# Patient Record
Sex: Male | Born: 1935 | Race: White | Hispanic: No | Marital: Married | Smoking: Never smoker
Health system: Southern US, Community
[De-identification: ages and names within clinical notes are randomized; demographics above are authoritative.]

## PROBLEM LIST (undated history)

## (undated) DIAGNOSIS — M1990 Unspecified osteoarthritis, unspecified site: Secondary | ICD-10-CM

## (undated) DIAGNOSIS — T7840XA Allergy, unspecified, initial encounter: Secondary | ICD-10-CM

## (undated) DIAGNOSIS — C61 Malignant neoplasm of prostate: Secondary | ICD-10-CM

## (undated) DIAGNOSIS — N400 Enlarged prostate without lower urinary tract symptoms: Secondary | ICD-10-CM

## (undated) DIAGNOSIS — E785 Hyperlipidemia, unspecified: Secondary | ICD-10-CM

## (undated) DIAGNOSIS — K219 Gastro-esophageal reflux disease without esophagitis: Secondary | ICD-10-CM

## (undated) DIAGNOSIS — B351 Tinea unguium: Secondary | ICD-10-CM

## (undated) DIAGNOSIS — T8859XA Other complications of anesthesia, initial encounter: Secondary | ICD-10-CM

## (undated) DIAGNOSIS — J300 Vasomotor rhinitis: Secondary | ICD-10-CM

## (undated) DIAGNOSIS — T4145XA Adverse effect of unspecified anesthetic, initial encounter: Secondary | ICD-10-CM

## (undated) DIAGNOSIS — E119 Type 2 diabetes mellitus without complications: Secondary | ICD-10-CM

## (undated) DIAGNOSIS — D126 Benign neoplasm of colon, unspecified: Secondary | ICD-10-CM

## (undated) DIAGNOSIS — H269 Unspecified cataract: Secondary | ICD-10-CM

## (undated) DIAGNOSIS — Z87442 Personal history of urinary calculi: Secondary | ICD-10-CM

## (undated) DIAGNOSIS — M25552 Pain in left hip: Secondary | ICD-10-CM

## (undated) DIAGNOSIS — Z0000 Encounter for general adult medical examination without abnormal findings: Secondary | ICD-10-CM

## (undated) HISTORY — DX: Hyperlipidemia, unspecified: E78.5

## (undated) HISTORY — DX: Unspecified osteoarthritis, unspecified site: M19.90

## (undated) HISTORY — PX: COLONOSCOPY: SHX174

## (undated) HISTORY — DX: Benign prostatic hyperplasia without lower urinary tract symptoms: N40.0

## (undated) HISTORY — DX: Encounter for general adult medical examination without abnormal findings: Z00.00

## (undated) HISTORY — PX: EYE SURGERY: SHX253

## (undated) HISTORY — PX: CATARACT EXTRACTION, BILATERAL: SHX1313

## (undated) HISTORY — DX: Unspecified cataract: H26.9

## (undated) HISTORY — DX: Tinea unguium: B35.1

## (undated) HISTORY — DX: Type 2 diabetes mellitus without complications: E11.9

## (undated) HISTORY — DX: Gastro-esophageal reflux disease without esophagitis: K21.9

## (undated) HISTORY — DX: Allergy, unspecified, initial encounter: T78.40XA

## (undated) HISTORY — DX: Vasomotor rhinitis: J30.0

## (undated) HISTORY — PX: POLYPECTOMY: SHX149

## (undated) HISTORY — DX: Benign neoplasm of colon, unspecified: D12.6

## (undated) HISTORY — PX: CYSTOSCOPY WITH INSERTION OF UROLIFT: SHX6678

---
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
## 2001-07-07 DIAGNOSIS — D126 Benign neoplasm of colon, unspecified: Secondary | ICD-10-CM

## 2001-07-07 HISTORY — DX: Benign neoplasm of colon, unspecified: D12.6

## 2004-07-29 ENCOUNTER — Ambulatory Visit: Payer: Self-pay | Admitting: Gastroenterology

## 2004-08-12 ENCOUNTER — Ambulatory Visit: Payer: Self-pay | Admitting: Gastroenterology

## 2005-07-21 ENCOUNTER — Encounter: Admission: RE | Admit: 2005-07-21 | Discharge: 2005-07-21 | Payer: Self-pay | Admitting: Family Medicine

## 2005-07-25 ENCOUNTER — Encounter: Payer: Self-pay | Admitting: Internal Medicine

## 2006-09-03 ENCOUNTER — Encounter: Payer: Self-pay | Admitting: Internal Medicine

## 2007-09-05 ENCOUNTER — Encounter: Payer: Self-pay | Admitting: Internal Medicine

## 2007-09-05 LAB — CONVERTED CEMR LAB: PSA: 2 ng/mL

## 2008-09-29 ENCOUNTER — Encounter: Payer: Self-pay | Admitting: Internal Medicine

## 2009-06-19 ENCOUNTER — Ambulatory Visit: Payer: Self-pay | Admitting: Internal Medicine

## 2009-06-19 DIAGNOSIS — E119 Type 2 diabetes mellitus without complications: Secondary | ICD-10-CM | POA: Insufficient documentation

## 2009-06-19 DIAGNOSIS — K219 Gastro-esophageal reflux disease without esophagitis: Secondary | ICD-10-CM | POA: Insufficient documentation

## 2009-06-19 DIAGNOSIS — E785 Hyperlipidemia, unspecified: Secondary | ICD-10-CM | POA: Insufficient documentation

## 2009-06-19 DIAGNOSIS — H409 Unspecified glaucoma: Secondary | ICD-10-CM

## 2009-06-19 HISTORY — DX: Type 2 diabetes mellitus without complications: E11.9

## 2009-07-18 ENCOUNTER — Encounter (INDEPENDENT_AMBULATORY_CARE_PROVIDER_SITE_OTHER): Payer: Self-pay | Admitting: *Deleted

## 2009-07-30 ENCOUNTER — Encounter (INDEPENDENT_AMBULATORY_CARE_PROVIDER_SITE_OTHER): Payer: Self-pay | Admitting: *Deleted

## 2009-08-27 ENCOUNTER — Encounter (INDEPENDENT_AMBULATORY_CARE_PROVIDER_SITE_OTHER): Payer: Self-pay

## 2009-08-28 ENCOUNTER — Ambulatory Visit: Payer: Self-pay | Admitting: Gastroenterology

## 2009-09-11 ENCOUNTER — Ambulatory Visit: Payer: Self-pay | Admitting: Gastroenterology

## 2009-09-11 LAB — HM COLONOSCOPY

## 2009-09-13 ENCOUNTER — Encounter: Payer: Self-pay | Admitting: Gastroenterology

## 2009-09-25 ENCOUNTER — Ambulatory Visit: Payer: Self-pay | Admitting: Internal Medicine

## 2009-09-25 LAB — CONVERTED CEMR LAB
BUN: 18 mg/dL (ref 6–23)
CO2: 23 meq/L (ref 19–32)
Cholesterol: 159 mg/dL (ref 0–200)
Creatinine, Urine: 83.7 mg/dL
Indirect Bilirubin: 0.5 mg/dL (ref 0.0–0.9)
Microalb Creat Ratio: 6 mg/g (ref 0.0–30.0)
Total Protein: 6.6 g/dL (ref 6.0–8.3)
Triglycerides: 86 mg/dL (ref <150)
VLDL: 17 mg/dL (ref 0–40)

## 2009-10-01 ENCOUNTER — Telehealth: Payer: Self-pay | Admitting: Internal Medicine

## 2009-10-01 ENCOUNTER — Ambulatory Visit: Payer: Self-pay | Admitting: Internal Medicine

## 2009-10-02 ENCOUNTER — Encounter: Payer: Self-pay | Admitting: Internal Medicine

## 2009-10-03 ENCOUNTER — Encounter: Payer: Self-pay | Admitting: Internal Medicine

## 2009-10-03 LAB — CONVERTED CEMR LAB
PSA, Free: 0.7 ng/mL
PSA: 3.06 ng/mL (ref 0.10–4.00)

## 2009-10-04 ENCOUNTER — Telehealth: Payer: Self-pay | Admitting: Internal Medicine

## 2009-10-04 DIAGNOSIS — R972 Elevated prostate specific antigen [PSA]: Secondary | ICD-10-CM

## 2009-10-05 ENCOUNTER — Encounter: Payer: Self-pay | Admitting: Internal Medicine

## 2009-10-19 ENCOUNTER — Telehealth: Payer: Self-pay | Admitting: Internal Medicine

## 2009-11-26 ENCOUNTER — Encounter: Payer: Self-pay | Admitting: Internal Medicine

## 2010-02-06 ENCOUNTER — Ambulatory Visit: Payer: Self-pay | Admitting: Family

## 2010-02-06 DIAGNOSIS — J329 Chronic sinusitis, unspecified: Secondary | ICD-10-CM | POA: Insufficient documentation

## 2010-02-06 DIAGNOSIS — J40 Bronchitis, not specified as acute or chronic: Secondary | ICD-10-CM

## 2010-03-18 ENCOUNTER — Encounter: Payer: Self-pay | Admitting: Internal Medicine

## 2010-03-18 ENCOUNTER — Telehealth: Payer: Self-pay | Admitting: Internal Medicine

## 2010-03-18 LAB — CONVERTED CEMR LAB
BUN: 16 mg/dL
CO2: 22 meq/L
Calcium: 8.9 mg/dL
Chloride: 105 meq/L
Creatinine, Ser: 0.99 mg/dL
Creatinine, Urine: 126.9 mg/dL
Glucose, Bld: 199 mg/dL — ABNORMAL HIGH
Hgb A1c MFr Bld: 6.9 % — ABNORMAL HIGH
Microalb Creat Ratio: 3.9 mg/g
Microalb, Ur: 0.5 mg/dL
PSA: 2.08 ng/mL
Potassium: 4.2 meq/L
Sodium: 138 meq/L
TSH: 1.006 u[IU]/mL

## 2010-03-29 ENCOUNTER — Ambulatory Visit: Payer: Self-pay | Admitting: Internal Medicine

## 2010-04-01 ENCOUNTER — Telehealth: Payer: Self-pay | Admitting: Internal Medicine

## 2010-04-10 ENCOUNTER — Telehealth: Payer: Self-pay | Admitting: Internal Medicine

## 2010-04-12 ENCOUNTER — Encounter: Payer: Self-pay | Admitting: Internal Medicine

## 2010-04-16 ENCOUNTER — Telehealth: Payer: Self-pay | Admitting: Internal Medicine

## 2010-06-21 ENCOUNTER — Ambulatory Visit: Payer: Self-pay | Admitting: Internal Medicine

## 2010-07-23 ENCOUNTER — Encounter: Payer: Self-pay | Admitting: Internal Medicine

## 2010-07-23 LAB — CONVERTED CEMR LAB
Albumin: 4.3 g/dL (ref 3.5–5.2)
Alkaline Phosphatase: 60 units/L (ref 39–117)
CO2: 25 meq/L (ref 19–32)
Chloride: 106 meq/L (ref 96–112)
Creatinine, Ser: 0.99 mg/dL (ref 0.40–1.50)
Glucose, Bld: 164 mg/dL — ABNORMAL HIGH (ref 70–99)
Indirect Bilirubin: 0.5 mg/dL (ref 0.0–0.9)
Potassium: 4.5 meq/L (ref 3.5–5.3)
Total Bilirubin: 0.6 mg/dL (ref 0.3–1.2)
Total CHOL/HDL Ratio: 2.8
Total Protein: 6.5 g/dL (ref 6.0–8.3)

## 2010-07-29 ENCOUNTER — Ambulatory Visit
Admission: RE | Admit: 2010-07-29 | Discharge: 2010-07-29 | Payer: Self-pay | Source: Home / Self Care | Attending: Internal Medicine | Admitting: Internal Medicine

## 2010-08-06 NOTE — Progress Notes (Signed)
Summary: Return Phone call requested  Phone Note Call from Patient Call back at [facility] Phone [phone]   Caller: Patient Call For: [name] DO Summary of Call: patient called and left voice message  requesting a return phone call.  Initial call taken by: Glendell Docker CMA,  April 16, 2010 10:47 AM  Follow-up for Phone Call        call was returned to patient at [phone], no answer. A voice message was left informing patient call was returned to patient Glendell Docker CMA  April 16, 2010 10:48 AM   Additional Follow-up for Phone Call Additional follow up Details #1::        Pt returned call and wanted to know if we had spoken to [facility] and clarified the Ipratropium rx. Advised pt per phone note of 10/5 that Ipratropium nasal spray had been sent to [facility] and verified with pharmacist. Pt states he has gotten .06% in the past but will try .03% first to see if it will work. He will let us know if it doesn't seem to work as well.  Pt also states that [facility] pharmacist told him the Januvia strength we sent in was not appropriate for his age and he wanted to know if we had spoken to them re: this.  I advised pt I could not tell if that conversation had taken place. He asked that [facility] call Don @ [facility] [phone] Ext [phone]. Nicki Guadalajara Fergerson CMA Duncan Dull)  April 16, 2010 2:00 PM     Additional Follow-up for Phone Call Additional follow up Details #2::    I checked is kidney function.  he can tolerate prescribed dose of januvia Follow-up by: [name] DO,  April 16, 2010 5:27 PM  Additional Follow-up for Phone Call Additional follow up Details #3:: Details for Additional Follow-up Action Taken: call returned to patient he was informed per Dr Artist Pais instructions. Patient was also informed that [facility] will forward information regarding the Januvia to Dr Artist Pais.  Patient verbalized understanding and stated that he was not sure if that was something we needed to take care of on our  end. Additional Follow-up by: Glendell Docker CMA,  April 17, 2010 8:05 AM

## 2010-08-06 NOTE — Progress Notes (Signed)
Summary: [facility] rx  Phone Note Call from Patient Call back at Home Phone [phone]   Caller: Patient Reason for Call: Talk to Nurse Summary of Call: Pt has not picked up Rx from [facility], pt is concerned about [facility] becoming concerned that pt is in their system for Metformin, he feels like the Actose should be sent to [facility] instead, pls call pt to verify Initial call taken by: Lannette Donath,  April 01, 2010 1:41 PM  Follow-up for Phone Call        call was returned to patient at [phone]. He was concerned that his medications did not go to [facility]. He was informed that his prescriptions were sent to [facility] on his 9/23 office visit. He was also informed that rx's were sent to [facility] for the Actos and the Metformin. Patient wanted to know why his prescriptions were sent to [facility] and not [facility]. He was informed that rx's were sent to [facility]  in error, but they have been sent to [facility] as requested. Follow-up by: Glendell Docker CMA,  April 01, 2010 5:22 PM

## 2010-08-06 NOTE — Progress Notes (Signed)
Summary: PSA  Phone Note Call from Patient   Caller: Patient Details for Reason: Labs Summary of Call: Pt had cpx this morning &  got a copy of labs    ? why no PSA was order  can this be added to labs     call pt   [phone] Initial call taken by: Darral Dash,  October 01, 2009 3:55 PM  Follow-up for Phone Call        Darlene, can you call lab and see if not too late to add PSA.   If they can not add, plan for next lab draw.   If pt wants sooner,  he will need to come for re draw Follow-up by: [name] DO,  October 02, 2009 1:26 PM  Additional Follow-up for Phone Call Additional follow up Details #1::        call placed to [facility], blood was discarded this am, Psa unable to be added. Call placed to patient at [phone], patient not available. Message left with patients wife [name] to return call regarding blood draw. If he would like to have a Psa checked, he will need to return for the blood work Additional Follow-up by: Glendell Docker CMA,  October 02, 2009 2:02 PM    Additional Follow-up for Phone Call Additional follow up Details #2::    patient will come by for a blood draw for PSA. He will come by tomorrow Roselle Locus  October 02, 2009 3:52 PM

## 2010-08-06 NOTE — Progress Notes (Signed)
Summary: PSA order  ---- Converted from flag ---- ---- 03/18/2010 2:34 PM, Mervin Kung CMA (AAMA) wrote: Sherron Monday to [place] @ [phone] and added PSA w/ dx 790.93.  ---- 03/18/2010 2:03 PM, [name] DO wrote: Just PSA plz  ---- 03/18/2010 10:29 AM, Mervin Kung CMA (AAMA) wrote: Do we do total or free and total PSA?  ---- 03/18/2010 10:15 AM, Roselle Locus wrote: patient is in lab and needs lab order please be sure PSA is included ------------------------------  labs have been added Glendell Docker CMA  March 18, 2010 2:36 PM

## 2010-08-06 NOTE — Miscellaneous (Signed)
Summary: psa order  Clinical Lists Changes  Orders: Added new Test order of T-PSA ([id number]) - Signed  Appended Document: psa order lab test has been added

## 2010-08-06 NOTE — Assessment & Plan Note (Signed)
Summary: 3 month follow up/mhf, resched- jr   Vital Signs:  Patient profile:   76 year old male Height:      72.5 inches Weight:      190 pounds BMI:     25.51 O2 Sat:      100 % on Room air Pulse rate:   100 / minute Pulse rhythm:   regular Resp:     18 per minute BP sitting:   118 / 60  (right arm) Cuff size:   large  Vitals Entered By: Glendell Docker CMA (October 01, 2009 8:38 AM)  O2 Flow:  Room air CC: Rm 2- 3 month follow up , Type 2 diabetes mellitus follow-up Comments no concerns, refills on medication to [facility], discuss valtrex rx   Primary Care Provider:  Dondra Spry DO  CC:  Rm 2- 3 month follow up  and Type 2 diabetes mellitus follow-up.  History of Present Illness:  Type 2 Diabetes Mellitus Follow-Up      This is a 76 year old man who presents for Type 2 diabetes mellitus follow-up.  The patient denies self managed hypoglycemia, hypoglycemia requiring help, and weight gain.  The patient denies the following symptoms: chest pain.  Since the last visit the patient reports good dietary compliance, compliance with medications, exercising regularly, and monitoring blood glucose.    Hyperlipidemia - stable.  reviewed lab results   Preventive Screening-Counseling & Management  Alcohol-Tobacco     Smoking Status: never  Allergies: 1)  ! * Sulfur Drug  Past History:  Past Medical History: Diabetes mellitus, type II (2000) Hyperlipidemia GERD  Glaucoma -  followed by Dr. 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   Neck:  No deformities, masses, or tenderness noted.no carotid bruits.   Lungs:  normal respiratory effort, normal breath sounds, no crackles, and no wheezes.   Heart:  normal rate, regular rhythm, no murmur, and no gallop.   Extremities:  No lower extremity  edema    Impression & Recommendations:  Problem # 1:  DIABETES MELLITUS, TYPE II (ICD-250.00) Assessment Improved a1c improved with new med regimen and dietary change.  no further issues with hypoglycemia since stopping sulfonylurea.  His updated medication list for this problem includes:    Actoplus Met 15-850 Mg Tabs (Pioglitazone hcl-metformin hcl) ..... One by mouth two times a day  Labs Reviewed: Creat: 1.02 (09/25/2009)     Last Eye Exam: normal (12/12/2008) Reviewed HgBA1c results: 6.4 (09/25/2009)  Problem # 2:  HYPERLIPIDEMIA (ICD-272.4) LDL at goal.  Maintain current medication regimen.  His updated medication list for this problem includes:    Zocor 40 Mg Tabs (Simvastatin) .Marland Kitchen... Take 1 tablet by mouth once a day  Labs Reviewed: SGOT: 18 (09/25/2009)   SGPT: 18 (09/25/2009)   HDL:57 (09/25/2009)  LDL:85 (09/25/2009)  Chol:159 (09/25/2009)  Trig:86 (09/25/2009)  Complete Medication List: 1)  Zocor 40 Mg Tabs (Simvastatin) .... Take 1 tablet by mouth once a day 2)  Actoplus Met 15-850 Mg Tabs (Pioglitazone hcl-metformin hcl) .... One by mouth two times a day 3)  Travatan Z 0.004 % Soln (Travoprost) .... One drop each eye once daily 4)  Azopt 1 % Susp (Brinzolamide) .... One drop each eye three times a day 5)  Ranitidine Hcl 150 Mg Tabs (Ranitidine hcl) .... One by mouth two times a  day as needed 6)  Atrovent Hfa 17 Mcg/act Aers (Ipratropium bromide hfa) .... 2 sprays each nostril once daily as needed 7)  Combigan 0.2-0.5 % Soln (Brimonidine tartrate-timolol) .... 2 drops each eye once daily 8)  Valtrex 1 Gm Tabs (Valacyclovir hcl) .... Take 1 tablet by mouth once a day as needed  Patient Instructions: 1)  Please schedule a follow-up appointment in 6 months. 2)  BMP prior to visit, ICD-9:  401.9 3)  HbgA1C prior to visit, ICD-9: 250.00 4)  Urine Microalbumin prior to visit, ICD-9:  250.00 5)  TSH:  272.4 6)  Please return for lab work one (1) week before your next  appointment.   Current Allergies (reviewed today): ! * SULFUR DRUG   Immunization History:  Pneumovax Immunization History:    Pneumovax:  historical (03/20/2009)

## 2010-08-06 NOTE — Progress Notes (Signed)
Summary: Lab Results  Phone Note Outgoing Call   Summary of Call: call pt - PSA was 3.06.  can pt provide historical PSA values from previous PCP Initial call taken by: [name] DO,  October 04, 2009 8:20 AM  Follow-up for Phone Call        call placed to patient he was advised per Dr Artist Pais instructions, states his records are in our office, but he will check his files. Follow-up by: Glendell Docker CMA,  October 04, 2009 10:23 AM  Additional Follow-up for Phone Call Additional follow up Details #1::        [phone] pt. called and upset because he has not heard back from our office about PSA. pt. wants to know if we compared it to his prev. psa? He states he gave [place] his old records. please call pt. back  Additional Follow-up by: Michaelle Copas,  October 04, 2009 3:13 PM  New Problems: PSA, INCREASED (ICD-790.93)   Additional Follow-up for Phone Call Additional follow up Details #2::    prev PSA in 09/28/2008 2.07. His PSA is higher.   I suggest we repeat in 6 months.  Follow-up by: [name] DO,  October 05, 2009 5:27 PM  Additional Follow-up for Phone Call Additional follow up Details #3:: Details for Additional Follow-up Action Taken: patient has been advised per Dr Artist Pais instructions , PSA added to September 19TH  labs Additional Follow-up by: Glendell Docker CMA,  October 08, 2009 8:17 AM  New Problems: PSA, INCREASED (ICD-790.93)

## 2010-08-06 NOTE — Letter (Signed)
Summary: [facility] Instructions  [facility]  [address] [place], [place] [address]   Phone: [phone]  Fax: [phone]       [name]    12/29/1934    MRN: [id number]        Procedure Day /Date: Tuesday 09/11/09     Arrival Time: 8:00 am     Procedure Time: 9:00 am     Location of Procedure:                    _x _  [facility] (4th Floor)                        PREPARATION FOR COLONOSCOPY WITH MOVIPREP   Starting 5 days prior to your procedure 09/06/09 do not eat nuts, seeds, popcorn, corn, beans, peas,  salads, or any raw vegetables.  Do not take any fiber supplements (e.g. Metamucil, Citrucel, and Benefiber).  THE DAY BEFORE YOUR PROCEDURE         DATE: 09/10/09  DAY: Monday  1.  Drink clear liquids the entire day-NO SOLID FOOD  2.  Do not drink anything colored red or purple.  Avoid juices with pulp.  No orange juice.  3.  Drink at least 64 oz. (8 glasses) of fluid/clear liquids during the day to prevent dehydration and help the prep work efficiently.  CLEAR LIQUIDS INCLUDE: Water Jello Ice Popsicles Tea (sugar ok, no milk/cream) Powdered fruit flavored drinks Coffee (sugar ok, no milk/cream) Gatorade Juice: apple, white grape, white cranberry  Lemonade Clear bullion, consomm, broth Carbonated beverages (any kind) Strained chicken noodle soup Hard Candy                             4.  In the morning, mix first dose of MoviPrep solution:    Empty 1 Pouch A and 1 Pouch B into the disposable container    Add lukewarm drinking water to the top line of the container. Mix to dissolve    Refrigerate (mixed solution should be used within 24 hrs)  5.  Begin drinking the prep at 5:00 p.m. The MoviPrep container is divided by 4 marks.   Every 15 minutes drink the solution down to the next mark (approximately 8 oz) until the full liter is complete.   6.  Follow completed prep with 16 oz of clear liquid of your choice (Nothing  red or purple).  Continue to drink clear liquids until bedtime.  7.  Before going to bed, mix second dose of MoviPrep solution:    Empty 1 Pouch A and 1 Pouch B into the disposable container    Add lukewarm drinking water to the top line of the container. Mix to dissolve    Refrigerate  THE DAY OF YOUR PROCEDURE      DATE: 09/11/09  DAY: Tuesday  Beginning at  4:00 a.m. (5 hours before procedure):         1. Every 15 minutes, drink the solution down to the next mark (approx 8 oz) until the full liter is complete.  2. Follow completed prep with 16 oz. of clear liquid of your choice.    3. You may drink clear liquids until  7:00 am (2 HOURS BEFORE PROCEDURE).   MEDICATION INSTRUCTIONS  Unless otherwise instructed, you should take regular prescription medications with a small sip of water   as early as possible  the morning of your procedure.  Diabetic patients - see separate instructions.           OTHER INSTRUCTIONS  You will need a responsible adult at least 74 years of age to accompany you and drive you home.   This person must remain in the waiting room during your procedure.  Wear loose fitting clothing that is easily removed.  Leave jewelry and other valuables at home.  However, you may wish to bring a book to read or  an iPod/MP3 player to listen to music as you wait for your procedure to start.  Remove all body piercing jewelry and leave at home.  Total time from sign-in until discharge is approximately 2-3 hours.  You should go home directly after your procedure and rest.  You can resume normal activities the  day after your procedure.  The day of your procedure you should not:   Drive   Make legal decisions   Operate machinery   Drink alcohol   Return to work  You will receive specific instructions about eating, activities and medications before you leave.    The above instructions have been reviewed and explained to me by   Ulis Rias RN   August 28, 2009 9:16 AM     I fully understand and can verbalize these instructions _____________________________ Date _________

## 2010-08-06 NOTE — Medication Information (Signed)
Summary: Fax Regarding Januvia High Dose in the Elderly/[facility]  Fax Regarding Januvia High Dose in the Elderly/[facility]   Imported By: Lanelle Bal 04/18/2010 11:59:27  _____________________________________________________________________  External Attachment:    Type:   Image     Comment:   External Document

## 2010-08-06 NOTE — Letter (Signed)
Summary: Colonoscopy Letter  [facility]  [address] [place], [place] [address]   Phone: [phone]  Fax: [phone]      July 18, 2009 MRN: [id number]   SALOMON GANSER [address] [place], [place]  [address]   Dear Mr. Cutrona,   According to your medical record, it is time for you to schedule a Colonoscopy. The American Cancer Society recommends this procedure as a method to detect early colon cancer. Patients with a family history of colon cancer, or a personal history of colon polyps or inflammatory bowel disease are at increased risk.  This letter has beeen generated based on the recommendations made at the time of your procedure. If you feel that in your particular situation this may no longer apply, please contact our office.  Please call our office at [phone] to schedule this appointment or to update your records at your earliest convenience.  Thank you for cooperating with [place] to provide you with the very best care possible.   Sincerely,  Judie Petit T. Russella Dar, M.D.  [facility] [phone]

## 2010-08-06 NOTE — Procedures (Signed)
Summary: Colonoscopy  Patient: [name] Note: All result statuses are Final unless otherwise noted.  Tests: (1) Colonoscopy (COL)   COL Colonoscopy           DONE     [facility]     [address]     [place], [place]  [address]           COLONOSCOPY PROCEDURE REPORT           PATIENT:  [name], [name]  MR#:  [id number]     BIRTHDATE:  January 29, 1935, 74 yrs. old  GENDER:  male           ENDOSCOPIST:  Judie Petit T. Russella Dar, MD, [facility]           PROCEDURE DATE:  09/11/2009     PROCEDURE:  Colonoscopy with snare polypectomy, and with hot     biopsy     ASA CLASS:  Class II     INDICATIONS:  1) follow-up of polyp, adenomatous polyps, 08/2001.           MEDICATIONS:   Fentanyl 50 mcg IV, Versed 6 mg IV           DESCRIPTION OF PROCEDURE:   After the risks benefits and     alternatives of the procedure were thoroughly explained, informed     consent was obtained.  Digital rectal exam was performed and     revealed no abnormalities.   The LB [id number] endoscope     was introduced through the anus and advanced to the cecum, which     was identified by both the appendix and ileocecal valve, without     limitations.  The quality of the prep was good, using MoviPrep.     The instrument was then slowly withdrawn as the colon was fully     examined.     <<PROCEDUREIMAGES>>           FINDINGS:  Three polyps were found in the mid transverse colon.     They were 4 mm in size. With hot biopsy forceps the polyps were     cauterized, biopsies were obtained and sent to pathology. A     sessile polyp was found in the descending colon. It was 5 mm in     size. Polyp was snared without cautery. Retrieval was successful.     Two polyps were found in the rectum. They were 3 - 4 mm in size.     With hot biopsy forceps the polyps were cauterized, biopsies were     obtained and sent to pathology.  Moderate diverticulosis was found     in the sigmoid colon.  This was otherwise a normal  examination of     the colon.   Retroflexed views in the rectum revealed internal     hemorrhoids, small.  The time to cecum =  3  minutes. The scope     was then withdrawn (time =  13  min) from the patient and the     procedure completed.           COMPLICATIONS:  None           ENDOSCOPIC IMPRESSION:     1) 4 mm Three polyps in the mid transverse colon     2) 5 mm sessile polyp in the descending colon     3) 3 - 4 mm Two polyps in the rectum     4) Moderate diverticulosis in  the sigmoid colon     5) Internal hemorrhoids           RECOMMENDATIONS:     1) No aspirin or NSAID's for 2 weeks     2) High fiber diet with liberal fluid intake.     3) Repeat Colonoscopy in 5 years.           Venita Lick. Russella Dar, MD, [facility]           CC: Thomos Lemons, DO           n.     eSIGNED:   Venita Lick. Stark at 09/11/2009 09:44 AM           Yum, Greggory Stallion, [id number]  Note: An exclamation mark (!) indicates a result that was not dispersed into the flowsheet. Document Creation Date: 09/11/2009 9:44 AM _______________________________________________________________________  (1) Order result status: Final Collection or observation date-time: 09/11/2009 09:38 Requested date-time:  Receipt date-time:  Reported date-time:  Referring Physician:   Ordering Physician: Claudette Head [phone]) Specimen Source:  Source: Launa Grill Order Number: [id number] Lab site:   Appended Document: Colonoscopy     Procedures Next Due Date:    Colonoscopy: 09/2014

## 2010-08-06 NOTE — Progress Notes (Signed)
Summary: Medication Change  Phone Note Outgoing Call   Summary of Call: call pt - I have had a chance to review information re:  concerns of Actos increasing bladder cancer. I suggest we switch Actos to Januvia.  See new rx Initial call taken by: [name] DO,  April 10, 2010 8:30 AM  Follow-up for Phone Call        call placed to patient at [phone], his wife [name]  states that he is out of town until Saturday. She was informed per Dr Artist Pais instructions and will pass the information along to patient. She was advised to have patient return call if any questions Follow-up by: Glendell Docker CMA,  April 10, 2010 9:07 AM    New/Updated Medications: JANUVIA 100 MG TABS (SITAGLIPTIN PHOSPHATE) one by mouth once daily Prescriptions: JANUVIA 100 MG TABS (SITAGLIPTIN PHOSPHATE) one by mouth once daily  #90 x 1   Entered and Authorized by:   [name] DO   Signed by:   [name] DO on 04/10/2010   Method used:   Electronically to        [facility]* (retail)             ,          Ph: [phone]       Fax: [phone]   RxID:   [id number]

## 2010-08-06 NOTE — Miscellaneous (Signed)
Summary: Lec previsit  Clinical Lists Changes  Medications: Added new medication of MOVIPREP 100 GM  SOLR (PEG-KCL-NACL-NASULF-NA ASC-C) As per prep instructions. - Signed Rx of MOVIPREP 100 GM  SOLR (PEG-KCL-NACL-NASULF-NA ASC-C) As per prep instructions.;  #1 x 0;  Signed;  Entered by: Ulis Rias RN;  Authorized by: Meryl Dare MD [facility];  Method used: Electronically to [facility]. [id number]*, [address] [place], [place], [place]  [address], Ph: [phone], Fax: [phone] Allergies: Added new allergy or adverse reaction of * SULFUR DRUG Observations: Added new observation of NKA: F (08/28/2009 8:59)    Prescriptions: MOVIPREP 100 GM  SOLR (PEG-KCL-NACL-NASULF-NA ASC-C) As per prep instructions.  #1 x 0   Entered by:   Ulis Rias RN   Authorized by:   Meryl Dare MD [facility]   Signed by:   Ulis Rias RN on 08/28/2009   Method used:   Electronically to        [id number] (retail)       [address]       [place], [place]  [address]       Ph: [phone]       Fax: [phone]   RxID:   [id number]

## 2010-08-06 NOTE — Medication Information (Signed)
Summary: Diabetes Supplies/[facility]  Diabetes Supplies/[facility]   Imported By: Lanelle Bal 10/11/2009 09:37:22  _____________________________________________________________________  External Attachment:    Type:   Image     Comment:   External Document

## 2010-08-06 NOTE — Miscellaneous (Signed)
Summary: Orders Update  Clinical Lists Changes  Problems: Added new problem of PREVENTIVE HEALTH CARE (ICD-V70.0) Orders: Added new Test order of T-PSA Total ([id number]) - Signed

## 2010-08-06 NOTE — Progress Notes (Signed)
Summary: Nasal Spray Clarification  Phone Note Call from Patient Call back at [phone]   Caller: Patient Call For: [name] DO Summary of Call: patient called and left voice message stating he was given a rx for a inhaler for atrovent. His message is stating he wanted a atrovent nasal spray and not an inhaler.  Call was placed to [facility], spoke with Claris Gower, she states rx for Actos was shipped on 04/01/2010, but the remaining refills will be cancelled. The information has been updated in [facility] system. Also spoke with the pharmacist about the Atrovent nasal spray. Theodoro Grist stated the rx recieved was for inhaler, but the discrepancy on directions should have been caught by the pharmacist.  He states this will be corrected and the office may get a call regarding clarification. The medication will be sent to patient at no additional cost Initial call taken by: Glendell Docker CMA,  April 10, 2010 11:50 AM  Follow-up for Phone Call        call was returned to patient at [phone], he was informed of the changes and correction to be made by [facility]. He states that he will contact [facility] to see if they will take the medications that they have sent back.   Phone left open to see if [facility] will call regarding clarification on Nasal Spray Follow-up by: Glendell Docker CMA,  April 10, 2010 12:08 PM  Additional Follow-up for Phone Call Additional follow up Details #1::        no call recieved for clarification of medication from [facility] Additional Follow-up by: Glendell Docker CMA,  April 11, 2010 9:00 AM     Appended Document: Nasal Spray Clarification    Phone Note From Pharmacy Call back at [phone]   Caller: Galen Daft - [facility] Pharmacist  Summary of Call: [facility] pharmacist called and left voice message stating the Atrovent nasal spray comes in 2 dosages 0.03%/55mcg and 0.06% . He would like to know which nasal spray dose should be dispensed to patient Initial call  taken by: Glendell Docker CMA,  April 11, 2010 1:18 PM  Follow-up for Phone Call        use .03 % Follow-up by: [name] DO,  April 12, 2010 3:22 PM  Additional Follow-up for Phone Call Additional follow up Details #1::        call placed to [facility], Spoke with [employer]. After placing me on hold for  10 minutes,  He states that the medication is" being worked on in [employer] and will be taken care of.  Additional Follow-up by: Glendell Docker CMA,  April 12, 2010 3:56 PM    New/Updated Medications: IPRATROPIUM BROMIDE 0.03 % SOLN (IPRATROPIUM BROMIDE) 2 sprays to each nostril three times a day as needed Prescriptions: IPRATROPIUM BROMIDE 0.03 % SOLN (IPRATROPIUM BROMIDE) 2 sprays to each nostril three times a day as needed  #3 month x 3   Entered and Authorized by:   [name] DO   Signed by:   [name] DO on 04/12/2010   Method used:   Electronically to        [facility]* (retail)             ,          Ph: [phone]       Fax: [phone]   RxID:   [id number]

## 2010-08-06 NOTE — Progress Notes (Signed)
Summary: Pt coming to office at 3PM today to pickup Rx  Phone Note Call from Patient Call back at Home Phone [phone]   Caller: Patient Call For: [name] DO Reason for Call: Refill Medication Summary of Call: Pt states he will be out of his meds in 1 week, he states they were supposed to go to [facility] but they haven't. Pt will be here at our office at [facility] today to pick up Rx for all of his meds. Initial call taken by: Lannette Donath,  October 19, 2009 10:22 AM  Follow-up for Phone Call        did he pick up prescriptions? Follow-up by: [name] DO,  October 22, 2009 1:51 PM  Additional Follow-up for Phone Call Additional follow up Details #1::        per Marj, patient picked up rx's pn Friday Additional Follow-up by: Glendell Docker CMA,  October 22, 2009 3:14 PM    Prescriptions: RANITIDINE HCL 150 MG TABS (RANITIDINE HCL) one by mouth two times a day as needed  #180 x 3   Entered and Authorized by:   [name] DO   Signed by:   [name] DO on 10/19/2009   Method used:   Print then Give to Patient   RxID:   [id number] ACTOPLUS MET 15-850 MG TABS (PIOGLITAZONE HCL-METFORMIN HCL) one by mouth two times a day  #180 x 3   Entered and Authorized by:   [name] DO   Signed by:   [name] DO on 10/19/2009   Method used:   Print then Give to Patient   RxID:   [id number] ZOCOR 40 MG TABS (SIMVASTATIN) Take 1 tablet by mouth once a day  #90 x 3   Entered and Authorized by:   [name] DO   Signed by:   [name] DO on 10/19/2009   Method used:   Print then Give to Patient   RxID:   [id number]

## 2010-08-06 NOTE — Letter (Signed)
Summary: Previsit letter  [facility]  [address] [place], [place] [address]   Phone: [phone]  Fax: [phone]       07/30/2009 MRN: [id number]  KALEB SEK [address] [place], [place]  [address]  Dear Mr. Schranz,  Welcome to the Gastroenterology Division at [facility].    You are scheduled to see a nurse for your pre-procedure visit on 08-28-09 at 9:00a.m. on the 3rd floor at [facility], [address]  We ask that you try to arrive at our office 15 minutes prior to your appointment time to allow for check-in.  Your nurse visit will consist of discussing your medical and surgical history, your immediate family medical history, and your medications.    Please bring a complete list of all your medications or, if you prefer, bring the medication bottles and we will list them.  We will need to be aware of both prescribed and over the counter drugs.  We will need to know exact dosage information as well.  If you are on blood thinners (Coumadin, Plavix, Aggrenox, Ticlid, etc.) please call our office today/prior to your appointment, as we need to consult with your physician about holding your medication.   Please be prepared to read and sign documents such as consent forms, a financial agreement, and acknowledgement forms.  If necessary, and with your consent, a friend or relative is welcome to sit-in on the nurse visit with you.  Please bring your insurance card so that we may make a copy of it.  If your insurance requires a referral to see a specialist, please bring your referral form from your primary care physician.  No co-pay is required for this nurse visit.     If you cannot keep your appointment, please call [phone] to cancel or reschedule prior to your appointment date.  This allows [place] the opportunity to schedule an appointment for another patient in need of care.    Thank you for choosing [facility] for your medical  needs.  We appreciate the opportunity to care for you.  Please visit [place] at our website  to learn more about our practice.                     Sincerely.                                                                                                                   The Gastroenterology Division

## 2010-08-06 NOTE — Letter (Signed)
Summary: Patient Notice- Polyp Results  [facility]  [address] [place], [place] [address]   Phone: [phone]  Fax: [phone]        September 13, 2009 MRN: [id number]    [name] [address] [place], [place]  [address]    Dear Mr. Quevedo,  I am pleased to inform you that the colon polyp(s) removed during your recent colonoscopy was (were) found to be benign (no cancer detected) upon pathologic examination.  I recommend you have a repeat colonoscopy examination in 5 years to look for recurrent polyps, as having colon polyps increases your risk for having recurrent polyps or even colon cancer in the future.  Should you develop new or worsening symptoms of abdominal pain, bowel habit changes or bleeding from the rectum or bowels, please schedule an evaluation with either your primary care physician or with me.  Continue treatment plan as outlined the day of your exam.  Please call us if you are having persistent problems or have questions about your condition that have not been fully answered at this time.  Sincerely,  Meryl Dare MD [facility]  This letter has been electronically signed by your physician.  Appended Document: Patient Notice- Polyp Results Letter mailed 3.11.11

## 2010-08-06 NOTE — Assessment & Plan Note (Signed)
Summary: head and chest congestion/mhf   Vital Signs:  Patient profile:   76 year old male Weight:      177 pounds BMI:     23.76 O2 Sat:      98 % on Room air Temp:     97.7 degrees F oral Pulse rate:   63 / minute Pulse rhythm:   regular Resp:     16 per minute BP sitting:   100 / 56  (right arm) Cuff size:   large  Vitals Entered By: Glendell Docker CMA (February 06, 2010 8:46 AM)  O2 Flow:  Room air CC: Head and Chest congestion Is Patient Diabetic? Yes Pain Assessment Patient in pain? no      Comments productive cough yellow in color fo rthe past 2 days,c/o nasal drainage into throat, taken rhintan with some releif   Primary Care Provider:  Dondra Spry DO  CC:  Head and Chest congestion.  History of Present Illness: Mr Tavella is a 76 year old male who presents with a two day history of chest congestion and cough which is productive of yellow sputum.  Also notes + sinus congestion with occasional yellow drainage.  Denies sinus pressure.  Reports that he has been treated with avelox in the past which has helped with his previous sinusitus.  Denies fever.  Energy is fair- was able to play golf yesterday- but continued to cough.  Reports that he tried some Rynatan last night which did help with his symptoms.  Preventive Screening-Counseling & Management  Alcohol-Tobacco     Smoking Status: never  Allergies: 1)  ! * Sulfur Drug 2)  * ?levaquin  Past History:  Past Medical History: Last updated: 10/01/2009 Diabetes mellitus, type II (2000) Hyperlipidemia GERD  Glaucoma -  follow[facility] by Dr. 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  Otoscopic examination reveals clear canals, tympanic membranes are intact bilaterally without bulging, retraction, inflammation or discharge. Hearing is grossly normal bilaterally. Mouth:  Oral mucosa and oropharynx without lesions or exudates.  Teeth in good repair. Neck:  No deformities, masses, or tenderness noted. Lungs:  Normal respiratory effort, chest expands symmetrically. Lungs are clear to auscultation, no crackles or wheezes. Heart:  Normal rate and regular rhythm. S1 and S2 normal without gallop, murmur, click, rub or other extra sounds.   Impression & Recommendations:  Problem # 1:  BRONCHITIS (ICD-490) Assessment New Will treat with Ceftin, lungs clear today, afebrile.  Patient to call if symptoms worsen or do not improve.  Patient declines anti-tussive at this time. His updated medication list for this problem includes:    Atrovent Hfa 17 Mcg/act Aers (Ipratropium bromide hfa) .Marland Kitchen... 2 sprays each nostril once daily as needed    Ceftin 500 Mg Tabs (Cefuroxime axetil) ..... One tablet by mouth two times a day x 10 days  Problem # 2:  SINUSITIS (ICD-473.9) Assessment: New Will treat with ceftin as for #1. His updated medication list for this problem includes:    Ceftin 500 Mg Tabs (Cefuroxime axetil) ..... One tablet by mouth two times a  day x 10 days  Complete Medication List: 1)  Zocor 40 Mg Tabs (Simvastatin) .... Take 1 tablet by mouth once a day 2)  Actoplus Met 15-850 Mg Tabs (Pioglitazone hcl-metformin hcl) .... One by mouth two times a day 3)  Travatan Z 0.004 % Soln (Travoprost) .... One drop each eye once daily 4)  Azopt 1 % Susp (Brinzolamide) .... One drop each eye three times a day 5)  Ranitidine Hcl 150 Mg Tabs (Ranitidine hcl) .... Take 1  tablet by mouth once a day 6)  Atrovent Hfa 17 Mcg/act Aers (Ipratropium bromide hfa) .... 2 sprays each nostril once daily as needed 7)  Combigan 0.2-0.5 % Soln (Brimonidine tartrate-timolol) .Marland Kitchen.. 1 drops each eye twice daily 8)  Valtrex 1 Gm Tabs (Valacyclovir hcl) .... Take 1 tablet by mouth once a day as needed 9)  Ceftin 500 Mg Tabs (Cefuroxime axetil) .... One tablet by mouth two times a day x 10 days  Patient Instructions: 1)  Please call if you develop fever, if your symptoms worsen or if your symptoms do not improve please call Dr. Artist Pais. 2)  Follow up with Dr. Artist Pais in 2-3 weeks. Prescriptions: CEFTIN 500 MG TABS (CEFUROXIME AXETIL) one tablet by mouth two times a day x 10 days  #20 x 0   Entered and Authorized by:   Lemont Fillers FNP   Signed by:   Lemont Fillers FNP on 02/06/2010   Method used:   Electronically to        [id number]* (retail)       [address] [place]       [place], [place]  [address]       Ph: [phone]       Fax: [phone]   RxID:   [id number]   Current Allergies (reviewed today): ! * SULFUR DRUG * ?LEVAQUIN   Immunization History:  Zostavax History:    Zostavax # 1:  zostavax (03/27/2009)

## 2010-08-06 NOTE — Miscellaneous (Signed)
Summary: Blood Work  Clinical Lists Changes  Orders: Added new Test order of T-Basic Metabolic Panel [phone]) - Signed Added new Test order of T- Hemoglobin A1C ([id number]) - Signed Added new Test order of T-TSH ([id number]) - Signed Added new Test order of T-Urine Microalbumin w/creat. ratio [phone]) - Signed

## 2010-08-06 NOTE — Assessment & Plan Note (Signed)
Summary: 6 MONTH FOLLOW UP/MHF   Vital Signs:  Patient profile:   76 year old male Height:      72.5 inches Weight:      178.25 pounds BMI:     23.93 O2 Sat:      100 % on Room air Temp:     97.7 degrees F oral Pulse rate:   58 / minute Pulse rhythm:   regular Resp:     16 per minute BP sitting:   100 / 60  (right arm) Cuff size:   large  Vitals Entered By: Glendell Docker CMA (March 29, 2010 8:17 AM)  O2 Flow:  Room air CC: 6 Month follow up  Is Patient Diabetic? Yes Pain Assessment Patient in pain? no       Does patient need assistance? Ambulation Wheelchair   Primary Care Provider:  [name] DO  CC:  6 Month follow up .  History of Present Illness: 76 y/o white male for DM II f/u blood sugars somewhat higher inconsistent to actoplus met due to diarrhea side effects   Current Diet: Breakfast: sunday,  eggs bacon toast,  banana, special K, cheerios, oatmeal - raisins Lunch: hot dog with relish,  apple DInner:  wife cooks low fat - chicken cassarole, usual carb - pasta, occ rice, potatoes Snacks:   Beverage: water, 3 beers per month   Preventive Screening-Counseling & Management  Alcohol-Tobacco     Smoking Status: current  Allergies: 1)  ! * Sulfur Drug 2)  * ?levaquin  Past History:  Past Medical History: Diabetes mellitus, type II (2000) Hyperlipidemia  GERD  Glaucoma -  followed by Dr. Melene Muller at [facility] B[facility]  Social History: Smoking Status:  current  Physical Exam  General:  alert, well-developed, and well-nourished.   Neck:  No deformities, masses, or tenderness noted. Lungs:  Normal respiratory effort, chest expands symmetrically. Lungs are clear to auscultation, no crackles or wheezes. Heart:  Normal rate and regular rhythm. S1 and S2 normal without gallop, murmur, click, rub or other extra sounds. Extremities:  No lower extremity edema Neurologic:  cranial nerves II-XII intact and gait normal.    Diabetes  Management Exam:    Foot Exam (with socks and/or shoes not present):       Inspection:          Left foot: normal          Right foot: normal    Eye Exam:       Eye Exam done elsewhere          Date: 03/14/2010          Results: normal          Done by: [facility]   Impression & Recommendations:  Problem # 1:  DIABETES MELLITUS, TYPE II (ICD-250.00) Assessment Deteriorated A1c slightly worse.  he has not been taking metformin due to diarrhea.  reduce dose to 500 mg  His updated medication list for this problem includes:    Actos 30 Mg Tabs (Pioglitazone hcl) ..... One by mouth once daily    Metformin Hcl 500 Mg Tabs (Metformin hcl) ..... One by mouth two times a day  Labs Reviewed: Creat: 0.99 (03/18/2010)     Last Eye Exam: normal (03/14/2010) Reviewed HgBA1c results: 6.9 (03/18/2010)  6.4 (09/25/2009)  Complete Medication List: 1)  Zocor 40 Mg Tabs (Simvastatin) .... Take 1 tablet by mouth once a day 2)  Actos 30 Mg Tabs (Pioglitazone hcl) .... One by mouth once  daily 3)  Travatan Z 0.004 % Soln (Travoprost) .... One drop each eye once daily 4)  Azopt 1 % Susp (Brinzolamide) .... One drop each eye three times a day 5)  Ranitidine Hcl 150 Mg Tabs (Ranitidine hcl) .... Take 1 tablet by mouth once a day 6)  Atrovent Hfa 17 Mcg/act Aers (Ipratropium bromide hfa) .... 2 sprays each nostril once daily as needed 7)  Combigan 0.2-0.5 % Soln (Brimonidine tartrate-timolol) .Marland Kitchen.. 1 drops each eye twice daily 8)  Valtrex 1 Gm Tabs (Valacyclovir hcl) .... Take 1 tablet by mouth once a day as needed 9)  Levitra 20 Mg Tabs (Vardenafil hcl) .... Take 1 tablet by mouth once a day 10)  Metformin Hcl 500 Mg Tabs (Metformin hcl) .... One by mouth two times a day  Patient Instructions: 1)  Please schedule a follow-up appointment in 4  months. 2)  BMP prior to visit, ICD-9:  401.9 3)  HbgA1C prior to visit, ICD-9:  250.00 4)  Please return for lab work one (1) week before your next  appointment.  Prescriptions: METFORMIN HCL 500 MG TABS (METFORMIN HCL) one by mouth two times a day  #180 x 1   Entered and Authorized by:   [name] DO   Signed by:   [name] DO on 03/29/2010   Method used:   Electronically to        [facility]* (retail)             ,          Ph: [phone]       Fax: [phone]   RxID:   [id number] ACTOS 30 MG TABS (PIOGLITAZONE HCL) one by mouth once daily  #90 x 1   Entered and Authorized by:   [name] DO   Signed by:   [name] DO on 03/29/2010   Method used:   Electronically to        [facility]* (retail)             ,          Ph: [phone]       Fax: [phone]   RxID:   [id number] LEVITRA 20 MG TABS (VARDENAFIL HCL) Take 1 tablet by mouth once a day  #10 x 3   Entered and Authorized by:   [name] DO   Signed by:   [name] DO on 03/29/2010   Method used:   Electronically to        [facility]* (retail)             ,          Ph: [phone]       Fax: [phone]   RxID:   [id number] VALTREX 1 GM TABS (VALACYCLOVIR HCL) Take 1 tablet by mouth once a day as needed  #100 x 3   Entered and Authorized by:   [name] DO   Signed by:   [name] DO on 03/29/2010   Method used:   Electronically to        [facility]* (retail)             ,          Ph: [phone]       Fax: [phone]   RxID:   [id number] ATROVENT HFA 17 MCG/ACT AERS (IPRATROPIUM BROMIDE HFA) 2 sprays each nostril once daily as needed  #3  bottles x 3   Entered and Authorized by:   [name] DO   Signed by:   [name] DO on 03/29/2010   Method used:   Electronically to        [facility]* (retail)             ,          Ph: [phone]       Fax: [phone]   RxID:   [id number] METFORMIN HCL 500 MG TABS (METFORMIN HCL) one by mouth two times a day  #180 x 1   Entered and Authorized by:   [name] DO   Signed by:   [name] DO on 03/29/2010    Method used:   Electronically to        [id number]* (retail)       [address]       [place], [place]  [address]       Ph: [phone]       Fax: [phone]   RxID:   [id number] ACTOS 30 MG TABS (PIOGLITAZONE HCL) one by mouth once daily  #90 x 1   Entered and Authorized by:   [name] DO   Signed by:   [name] DO on 03/29/2010   Method used:   Electronically to        [id number]* (retail)       [address]       [place], [place]  [address]       Ph: [phone]       Fax: [phone]   RxID:   [id number]   Current Allergies (reviewed today): ! * SULFUR DRUG * ?LEVAQUIN

## 2010-08-06 NOTE — Medication Information (Signed)
Summary: Diabetes Supplies/[facility]  Diabetes Supplies/[facility]   Imported By: Lanelle Bal 12/04/2009 08:32:51  _____________________________________________________________________  External Attachment:    Type:   Image     Comment:   External Document

## 2010-08-06 NOTE — Letter (Signed)
Summary: Diabetic Instructions  [facility]  [address] [place], [place] [address]   Phone: [phone]  Fax: [phone]    BRITT PETRONI 1934-11-30 MRN: [id number]   _x  _   ORAL DIABETIC MEDICATION INSTRUCTIONS  The day before your procedure:   Take your diabetic pill as you do normally  The day of your procedure:   Do not take your diabetic pill    We will check your blood sugar levels during the admission process and again in Recovery before discharging you home  ________________________________________________________________________

## 2010-08-06 NOTE — Miscellaneous (Signed)
  Clinical Lists Changes  Observations: Added new observation of PSA: 2.07 ng/mL (09/29/2008 17:29) Added new observation of PSA: 2.00 ng/mL (09/05/2007 17:29) Added new observation of PSA: 1.86 ng/mL (09/03/2006 17:29) Added new observation of PSA: 1.83 ng/mL (07/25/2005 17:29)

## 2010-08-08 NOTE — Miscellaneous (Signed)
Summary: Orders Update  Clinical Lists Changes  Orders: Added new Test order of T-Basic Metabolic Panel [phone]) - Signed Added new Test order of T- Hemoglobin A1C [phone]) - Signed Added new Test order of T-Lipid Profile ([id number]) - Signed Added new Test order of T-Hepatic Function [phone]) - Signed

## 2010-08-08 NOTE — Assessment & Plan Note (Signed)
Summary: SINUS INFECTION/DK   Vital Signs:  Patient profile:   76 year old male Height:      72.5 inches Weight:      177.50 pounds BMI:     23.83 O2 Sat:      97 % Temp:     98.4 degrees F oral Pulse rate:   62 / minute Resp:     18 per minute BP sitting:   110 / 60  (right arm) Cuff size:   large  Vitals Entered By: Glendell Docker CMA (June 21, 2010 8:15 AM) CC: Sinus Infection Is Patient Diabetic? Yes Pain Assessment Patient in pain? no        Primary Care Provider:  Dondra Spry DO  CC:  Sinus Infection.  History of Present Illness: post nasal gtt sore throat yellow green discharge onset 4-5 days bilateral sinus congestion  no fever  Allergies: 1)  ! * Sulfur Drug 2)  * ?levaquin  Past History:  Past Medical History: Diabetes mellitus, type II (2000) Hyperlipidemia   GERD  Glaucoma -  followed by Dr. 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   Ears:  R ear normal and L ear normal.   Mouth:  pharyngeal erythema.   Neck:  supple, no masses, and no neck tenderness.   Lungs:  normal respiratory effort, normal breath sounds, no crackles, and no wheezes.   Heart:  normal rate, regular rhythm, and no gallop.     Impression & Recommendations:  Problem # 1:  SINUSITIS (ICD-473.9) Assessment Deteriorated  His updated medication list for this problem includes:    Ipratropium Bromide 0.03 % Soln (Ipratropium bromide) .Marland Kitchen... 2 sprays to each nostril three times a day as needed    Cefuroxime Axetil 500 Mg Tabs (Cefuroxime axetil) ..... One by mouth two times a day  Take antibiotics for full duration. Discussed treatment options including indications for coronal CT scan of sinuses and ENT referral.   Complete Medication List: 1)  Zocor 40 Mg Tabs  (Simvastatin) .... Take 1 tablet by mouth once a day 2)  Travatan Z 0.004 % Soln (Travoprost) .... One drop each eye once daily 3)  Azopt 1 % Susp (Brinzolamide) .... One drop each eye three times a day 4)  Ranitidine Hcl 150 Mg Tabs (Ranitidine hcl) .... Take 1 tablet by mouth once a day 5)  Ipratropium Bromide 0.03 % Soln (Ipratropium bromide) .... 2 sprays to each nostril three times a day as needed 6)  Combigan 0.2-0.5 % Soln (Brimonidine tartrate-timolol) .Marland Kitchen.. 1 drops each eye twice daily 7)  Valtrex 1 Gm Tabs (Valacyclovir hcl) .... Take 1 tablet by mouth once a day as needed 8)  Levitra 20 Mg Tabs (Vardenafil hcl) .... Take 1 tablet by mouth once a day 9)  Metformin Hcl 500 Mg Tabs (Metformin hcl) .... One by mouth two times a day 10)  Januvia 100 Mg Tabs (Sitagliptin phosphate) .... One by mouth once daily 11)  Cefuroxime Axetil 500 Mg Tabs (Cefuroxime axetil) .... One by mouth two times a day  Patient Instructions: 1)  Use neil med sinus rinse over the counter 2)  Call our office if your symptoms do not  improve or gets worse. Prescriptions: CEFUROXIME AXETIL 500 MG TABS (CEFUROXIME AXETIL) one by mouth two times a day  #28 x  0   Entered and Authorized by:   [name] DO   Signed by:   [name] DO on 06/21/2010   Method used:   Electronically to        [facility] [address] [phone]* (retail)       [address]       [place], [place]  [address]       Ph: [phone]       Fax: [phone]   RxID:   [id number]    Orders Added: 1)  Est. Patient Level III [72536]   Immunization History:  Influenza Immunization History:    Influenza:  historical (04/16/2010)   Immunization History:  Influenza Immunization History:    Influenza:  Historical (04/16/2010)  Current Allergies (reviewed today): ! * SULFUR DRUG * ?LEVAQUIN

## 2010-08-14 NOTE — Assessment & Plan Note (Signed)
Summary: 4 month follow up/mhf   Vital Signs:  Patient profile:   76 year old male Height:      72.6 inches Weight:      176.75 pounds BMI:     23.66 O2 Sat:      59 % on Room air Temp:     97.5 degrees F oral Pulse rate:   59 / minute Resp:     18 per minute BP sitting:   90 / 50  (right arm) Cuff size:   large  Vitals Entered By: Glendell Docker CMA (July 29, 2010 9:22 AM)  O2 Flow:  Room air CC: 4 Month Follow up , Type 2 diabetes mellitus follow-up Is Patient Diabetic? Yes Did you bring your meter with you today? No Pain Assessment Patient in pain? no        Primary Care Provider:  Dondra Spry DO  CC:  4 Month Follow up  and Type 2 diabetes mellitus follow-up.  History of Present Illness:  Type 2 Diabetes Mellitus Follow-Up      This is a 76 year old man who presents for Type 2 diabetes mellitus follow-up.  The patient denies self managed hypoglycemia, hypoglycemia requiring help, and weight gain.  The patient denies the following symptoms: chest pain.  Since the last visit the patient reports good dietary compliance and monitoring blood glucose.  blood sugar 7 day avg 144, 14 day 139,  30day 134  hyperlipidemia - stable  Preventive Screening-Counseling & Management  Alcohol-Tobacco     Smoking Status: never  Allergies: 1)  ! * Sulfur Drug 2)  * ?levaquin  Past History:  Past Medical History: Diabetes mellitus, type II (2000) Hyperlipidemia   GERD   Glaucoma -  followed by Dr. 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   Lungs:  normal respiratory effort, normal breath sounds, no crackles, and no wheezes.   Heart:  normal rate, regular rhythm, and no gallop.     Extremities:  No lower extremity edema Neurologic:  cranial nerves II-XII intact and gait normal.   Psych:  normally interactive, good eye contact, not anxious appearing, and not depressed appearing.     Impression & Recommendations:  Problem # 1:  DIABETES MELLITUS, TYPE II (ICD-250.00) Assessment Improved  increase walking program tweak diet Maintain current medication regimen.  His updated medication list for this problem includes:    Metformin Hcl 500 Mg Tabs (Metformin hcl) ..... One by mouth two times a day    Januvia 100 Mg Tabs (Sitagliptin phosphate) ..... One by mouth once daily  Labs Reviewed: Creat: 0.99 (07/23/2010)     Last Eye Exam: normal (03/14/2010) Reviewed HgBA1c results: 6.6 (07/23/2010)  6.9 (03/18/2010)  Future Orders: T-Urine Microalbumin w/creat. ratio [id number]) ... 01/20/2011 T- Hemoglobin A1C ([id number]) ... 01/20/2011  Problem # 2:  HYPERLIPIDEMIA (ICD-272.4) Assessment: Unchanged  His updated medication list for this problem includes:    Zocor 40 Mg Tabs (Simvastatin) .Marland Kitchen... Take 1 tablet by mouth once a day  Labs Reviewed: SGOT: 24 (07/23/2010)   SGPT: 21 (07/23/2010)   HDL:51 (07/23/2010), 57 (09/25/2009)  LDL:78 (07/23/2010), 85 (09/25/2009)  Chol:145 (07/23/2010), 159 (09/25/2009)  Trig:82 (07/23/2010), 86 (09/25/2009)  Future Orders: T-Basic Metabolic Panel [id number]) ... 01/20/2011  Complete Medication List: 1)  Zocor 40 Mg Tabs (Simvastatin) .... Take 1 tablet by mouth once a day 2)  Travatan Z 0.004 % Soln (Travoprost) .... One drop each eye once daily 3)  Azopt 1 % Susp (Brinzolamide) .... One drop each eye three times a day 4)  Ranitidine Hcl 150 Mg Tabs (Ranitidine hcl) .... Take 1 tablet by mouth once a day 5)  Ipratropium Bromide 0.03 % Soln (Ipratropium bromide) .... 2 sprays to each nostril three times a day as needed 6)  Combigan 0.2-0.5 % Soln (Brimonidine tartrate-timolol) .Marland Kitchen.. 1 drops each eye twice  daily 7)  Valtrex 1 Gm Tabs (Valacyclovir hcl) .... Take 1 tablet by mouth once a day as needed 8)  Levitra 20 Mg Tabs (Vardenafil hcl) .... Take 1 tablet by mouth once a day 9)  Metformin Hcl 500 Mg Tabs (Metformin hcl) .... One by mouth two times a day 10)  Januvia 100 Mg Tabs (Sitagliptin phosphate) .... One by mouth once daily  Other Orders: Future Orders: T-PSA ([id number]) ... 01/20/2011  Patient Instructions: 1)  Please schedule a follow-up appointment in 6 months  CPX. 2)  BMP prior to visit, ICD-9:  401.9 3)  PSA prior to visit, ICD-9: v76.44 4)  HbgA1C prior to visit, ICD-9:  250.00 5)  Urine microalbumin/ Cr ration:  250.00 6)  Please return for lab work one (1) week before your next appointment.    Orders Added: 1)  T-Basic Metabolic Panel [[id number]] 2)  T-PSA [[id number]] 3)  T-Urine Microalbumin w/creat. ratio [[id number]] 4)  T- Hemoglobin A1C [[id number]] 5)  Est. Patient Level III [29562]   Immunization History:  Tetanus/Td Immunization History:    Tetanus/Td:  historical (11/16/2007)   Immunization History:  Tetanus/Td Immunization History:    Tetanus/Td:  Historical (11/16/2007)  Current Allergies (reviewed today): ! * SULFUR DRUG * ?LEVAQUIN

## 2010-09-30 ENCOUNTER — Other Ambulatory Visit: Payer: Self-pay | Admitting: *Deleted

## 2010-09-30 MED ORDER — SITAGLIPTIN PHOSPHATE 100 MG PO TABS: 100.0000 mg | ORAL_TABLET | Freq: Every day | ORAL | Status: DC

## 2010-09-30 NOTE — Telephone Encounter (Signed)
Received fax request for Januvia 100mg  1 tablet daily #90 from [facility]. Pt next appt 01/27/11. Refills sent.

## 2010-12-11 ENCOUNTER — Other Ambulatory Visit: Payer: Self-pay | Admitting: Internal Medicine

## 2010-12-11 NOTE — Telephone Encounter (Signed)
Rx refill sent to pharmacy. 

## 2011-01-12 ENCOUNTER — Other Ambulatory Visit: Payer: Self-pay | Admitting: Internal Medicine

## 2011-01-14 MED ORDER — RANITIDINE HCL 150 MG PO TABS: 150.0000 mg | ORAL_TABLET | Freq: Every day | ORAL | Status: DC

## 2011-01-14 NOTE — Telephone Encounter (Signed)
Please send a refill for this patient with same sig and same quantity but no further refills so my name does not stay attached to his chart indefinitely and confuse matters

## 2011-01-14 NOTE — Telephone Encounter (Signed)
Spoke to pt re: Ranitidine clarification. Our records indicate once daily dosing and does not show that we have ever filled medication for pt before. Pt states that Dr Artist Pais originally prescribed this for pt and it is once daily dosing. Pt has appt. Next week with Dr Rodena Medin. Please advise if ok to refill med and if so how many refills.

## 2011-01-14 NOTE — Telephone Encounter (Signed)
Spoke with Jac Canavan and cancelled rx for bid dosing. Refill sent for 90 day supply x no refills for 150mg  1 daily. Pt notified.

## 2011-01-16 ENCOUNTER — Other Ambulatory Visit: Payer: Self-pay | Admitting: Internal Medicine

## 2011-01-16 DIAGNOSIS — E785 Hyperlipidemia, unspecified: Secondary | ICD-10-CM

## 2011-01-16 NOTE — Telephone Encounter (Signed)
CPE on 01/27/11.  RX sent.

## 2011-01-23 ENCOUNTER — Other Ambulatory Visit: Payer: Self-pay | Admitting: Internal Medicine

## 2011-01-23 ENCOUNTER — Encounter: Payer: Self-pay | Admitting: Internal Medicine

## 2011-01-23 ENCOUNTER — Telehealth: Payer: Self-pay | Admitting: *Deleted

## 2011-01-23 DIAGNOSIS — R972 Elevated prostate specific antigen [PSA]: Secondary | ICD-10-CM

## 2011-01-23 DIAGNOSIS — E119 Type 2 diabetes mellitus without complications: Secondary | ICD-10-CM

## 2011-01-23 DIAGNOSIS — E785 Hyperlipidemia, unspecified: Secondary | ICD-10-CM

## 2011-01-23 LAB — CBC WITH DIFFERENTIAL/PLATELET
Basophils Absolute: 0 10*3/uL (ref 0.0–0.1)
Basophils Relative: 1 % (ref 0–1)
Eosinophils Relative: 3 % (ref 0–5)
HCT: 44.4 % (ref 39.0–52.0)
Hemoglobin: 14.9 g/dL (ref 13.0–17.0)
Lymphocytes Relative: 18 % (ref 12–46)
MCH: 29.7 pg (ref 26.0–34.0)
MCHC: 33.6 g/dL (ref 30.0–36.0)
Monocytes Absolute: 0.8 10*3/uL (ref 0.1–1.0)
Monocytes Relative: 10 % (ref 3–12)
Platelets: 216 10*3/uL (ref 150–400)

## 2011-01-23 LAB — BASIC METABOLIC PANEL
BUN: 15 mg/dL (ref 6–23)
CO2: 26 mEq/L (ref 19–32)
Calcium: 9.1 mg/dL (ref 8.4–10.5)

## 2011-01-23 NOTE — Telephone Encounter (Signed)
Received call from [place] that order for urine microalbumin did not come through. Order was entered as clinic collect. Order entered for lab collect and faxed to [phone].

## 2011-01-23 NOTE — Telephone Encounter (Signed)
Pt presented to lab today for blood work. Future orders released and faxed to the lab.

## 2011-01-24 LAB — MICROALBUMIN / CREATININE URINE RATIO
Creatinine, Urine: 113 mg/dL
Microalb Creat Ratio: 4.4 mg/g (ref 0.0–30.0)
Microalb, Ur: 0.5 mg/dL (ref 0.00–1.89)

## 2011-01-24 LAB — HEMOGLOBIN A1C: Mean Plasma Glucose: 157 mg/dL — ABNORMAL HIGH (ref <117)

## 2011-01-24 LAB — LIPID PANEL
Cholesterol: 189 mg/dL (ref 0–200)
Total CHOL/HDL Ratio: 3.6 Ratio
VLDL: 18 mg/dL (ref 0–40)

## 2011-01-24 LAB — HEPATIC FUNCTION PANEL
ALT: 16 U/L (ref 0–53)
AST: 16 U/L (ref 0–37)
Indirect Bilirubin: 0.5 mg/dL (ref 0.0–0.9)
Total Protein: 6.4 g/dL (ref 6.0–8.3)

## 2011-01-27 ENCOUNTER — Encounter: Payer: Self-pay | Admitting: Internal Medicine

## 2011-01-27 ENCOUNTER — Ambulatory Visit (INDEPENDENT_AMBULATORY_CARE_PROVIDER_SITE_OTHER): Payer: Medicare Other | Admitting: Internal Medicine

## 2011-01-27 DIAGNOSIS — E785 Hyperlipidemia, unspecified: Secondary | ICD-10-CM

## 2011-01-27 DIAGNOSIS — E119 Type 2 diabetes mellitus without complications: Secondary | ICD-10-CM

## 2011-01-27 MED ORDER — RANITIDINE HCL 150 MG PO TABS: 150.0000 mg | ORAL_TABLET | Freq: Every day | ORAL | Status: DC

## 2011-01-27 MED ORDER — VALACYCLOVIR HCL 1 G PO TABS: 1000.0000 mg | ORAL_TABLET | Freq: Every day | ORAL | Status: DC | PRN

## 2011-01-27 MED ORDER — SITAGLIPTIN PHOSPHATE 100 MG PO TABS: 100.0000 mg | ORAL_TABLET | Freq: Every day | ORAL | Status: DC

## 2011-01-27 MED ORDER — METFORMIN HCL 500 MG PO TABS: 500.0000 mg | ORAL_TABLET | Freq: Two times a day (BID) | ORAL | Status: DC

## 2011-01-27 NOTE — Patient Instructions (Signed)
Please schedule a1c, chem7 (250.0) and lipid (272.4) prior to next visit

## 2011-02-01 NOTE — Assessment & Plan Note (Signed)
suboptimal control however was out of medication. Take statin medication daily. Repeat fasting lipid profile and liver function tests prior to next visit. LDL goal less than 100.

## 2011-02-01 NOTE — Assessment & Plan Note (Signed)
suboptimal A1c but improving fingerstick blood sugar average most recently. Continue current regimen. Focus on diabetic diet and exercise.

## 2011-02-01 NOTE — Progress Notes (Signed)
  Subjective:    Patient ID: [name], male    DOB: 1934/12/29, 76 y.o.   MRN: [id number]  HPI patient presents clinic for  followup of multiple medical problems. Blood sugar range has been between 112 and 203 with a predominance in the mid 100s. His 14 day average has improved to 135. No hypoglycemia. Reviewed A1c 7.1. Has history of hyperlipidemia with LDL reviewed 119. Was temporarily held of his statin. Tolerates without mild sore abnormal LFTs. History of elevated PSA and PSA in July reviewed normal. Diabetic eye exam up-to-date. No other complaints.  Reviewed past medical history, medications, and allergies  Review of Systems see hpi     Objective:   Physical Exam  Nursing note and vitals reviewed. Constitutional: He appears well-developed and well-nourished. No distress.  HENT:  Head: Normocephalic.  Right Ear: External ear normal.  Left Ear: External ear normal.  Nose: Nose normal.  Mouth/Throat: Oropharynx is clear and moist. No oropharyngeal exudate.  Eyes: Conjunctivae and EOM are normal. Pupils are equal, round, and reactive to light. Right eye exhibits no discharge. Left eye exhibits no discharge. No scleral icterus.  Neck: Neck supple. No JVD present.  Cardiovascular: Normal rate, regular rhythm, normal heart sounds and intact distal pulses.  Exam reveals no gallop and no friction rub.   No murmur heard. Pulmonary/Chest: Effort normal and breath sounds normal. No respiratory distress. He has no wheezes. He has no rales.  Abdominal: Soft. Bowel sounds are normal. He exhibits no distension. There is no tenderness. There is no rebound and no guarding.  Lymphadenopathy:    He has no cervical adenopathy.  Neurological: He is alert.  Skin: Skin is warm and dry. He is not diaphoretic.       Diabetic foot exam: no diabetic wounds ulcerations or significant callusing. Sensation intact          Assessment & Plan:   No problem-specific assessment & plan notes found  for this encounter.

## 2011-03-25 ENCOUNTER — Other Ambulatory Visit: Payer: Self-pay | Admitting: Internal Medicine

## 2011-04-22 ENCOUNTER — Telehealth: Payer: Self-pay | Admitting: Internal Medicine

## 2011-04-22 NOTE — Telephone Encounter (Signed)
Pt called and wants blood work taken before his appt on 11/12 does the pt needs to come in prior for this or or will he need to see Dr. Artist Pais first. Please advise

## 2011-04-23 NOTE — Telephone Encounter (Signed)
I suggest LFTs and lipid panel.  272.4 Also A1c 250.00

## 2011-04-25 NOTE — Telephone Encounter (Signed)
Pt called and that he needs to come in for labs prior to his ov with Dr Artist Pais on 05/19/11. The pt has been scheduled with the labs noted below for 05/21/11 and pts ov has been rsc for 05/28/11.

## 2011-04-29 ENCOUNTER — Ambulatory Visit: Payer: Medicare Other | Admitting: Internal Medicine

## 2011-05-19 ENCOUNTER — Ambulatory Visit: Payer: Medicare Other | Admitting: Internal Medicine

## 2011-05-20 ENCOUNTER — Ambulatory Visit (INDEPENDENT_AMBULATORY_CARE_PROVIDER_SITE_OTHER): Payer: Medicare Other | Admitting: Internal Medicine

## 2011-05-20 ENCOUNTER — Telehealth: Payer: Self-pay | Admitting: Internal Medicine

## 2011-05-20 ENCOUNTER — Encounter: Payer: Self-pay | Admitting: Internal Medicine

## 2011-05-20 ENCOUNTER — Other Ambulatory Visit: Payer: Medicare Other

## 2011-05-20 VITALS — BP 124/70 | HR 57 | Temp 98.1°F | Resp 18 | Ht 72.5 in | Wt 179.0 lb

## 2011-05-20 DIAGNOSIS — Z23 Encounter for immunization: Secondary | ICD-10-CM

## 2011-05-20 DIAGNOSIS — E785 Hyperlipidemia, unspecified: Secondary | ICD-10-CM

## 2011-05-20 DIAGNOSIS — E119 Type 2 diabetes mellitus without complications: Secondary | ICD-10-CM

## 2011-05-20 MED ORDER — SIMVASTATIN 40 MG PO TABS: 40.0000 mg | ORAL_TABLET | Freq: Every day | ORAL | Status: DC

## 2011-05-20 MED ORDER — METFORMIN HCL 500 MG PO TABS: ORAL_TABLET | ORAL | Status: DC

## 2011-05-20 MED ORDER — VALACYCLOVIR HCL 1 G PO TABS: 1000.0000 mg | ORAL_TABLET | Freq: Every day | ORAL | Status: DC

## 2011-05-20 MED ORDER — RANITIDINE HCL 150 MG PO TABS: 150.0000 mg | ORAL_TABLET | Freq: Every day | ORAL | Status: DC

## 2011-05-20 MED ORDER — SITAGLIPTIN PHOSPHATE 100 MG PO TABS: 100.0000 mg | ORAL_TABLET | Freq: Every day | ORAL | Status: DC

## 2011-05-20 NOTE — Patient Instructions (Signed)
Please schedule a1c, chem7, urine microalbumin 250.0 and lipid/lft 272.4 for tomorrow morning fasting Also please schedule chem7 and a1c 250.0 prior to next visit

## 2011-05-20 NOTE — Telephone Encounter (Signed)
Lab orders entered on office visit.

## 2011-05-20 NOTE — Progress Notes (Signed)
  Subjective:    Patient ID: [name], male    DOB: December 19, 1934, 76 y.o.   MRN: [id number]  HPI Pt presents to clinic for followup of multiple medical problems. Reviewed fsbs log without hypoglycemia. Average values mildly high. Tolerating metformin without gi adverse effect. Tolerates statin tx without myalgias. BP reviewed as normotensive. No active complaint.  Past Medical History  Diagnosis Date  . Diabetes mellitus 2000    type 2  . Hyperlipidemia   . GERD (gastroesophageal reflux disease)   . Glaucoma     followed by Dr Melene Muller at [facility]  . BPH (benign prostatic hypertrophy)    No past surgical history on file.  reports that he has never smoked. He has never used smokeless tobacco. He reports that he drinks alcohol. His drug history not on file. family history includes Cancer in his sister. No Known Allergies   Review of Systems see hpi     Objective:   Physical Exam  Physical Exam  Nursing note and vitals reviewed. Constitutional: Appears well-developed and well-nourished. No distress.  HENT:  Head: Normocephalic and atraumatic.  Right Ear: External ear normal.  Left Ear: External ear normal.  Eyes: Conjunctivae are normal. No scleral icterus.  Neck: Neck supple. Carotid bruit is not present.  Cardiovascular: Normal rate, regular rhythm and normal heart sounds.  Exam reveals no gallop and no friction rub.   No murmur heard. Pulmonary/Chest: Effort normal and breath sounds normal. No respiratory distress. He has no wheezes. no rales.  Lymphadenopathy:    He has no cervical adenopathy.  Neurological:Alert.  Skin: Skin is warm and dry. Not diaphoretic.  Psychiatric: Has a normal mood and affect.        Assessment & Plan:

## 2011-05-21 ENCOUNTER — Other Ambulatory Visit: Payer: Medicare Other

## 2011-05-21 LAB — HEMOGLOBIN A1C: Hgb A1c MFr Bld: 7.1 % — ABNORMAL HIGH (ref <5.7)

## 2011-05-21 NOTE — Assessment & Plan Note (Signed)
Increase metformin 1000mg  in am and 500mg  pm. Obtain a1c, chem7, urine microalbumin

## 2011-05-21 NOTE — Assessment & Plan Note (Signed)
Obtain lipid/lft. 

## 2011-05-22 LAB — BASIC METABOLIC PANEL
BUN: 13 mg/dL (ref 6–23)
Calcium: 9.4 mg/dL (ref 8.4–10.5)
Creat: 0.99 mg/dL (ref 0.50–1.35)
Potassium: 4.5 mEq/L (ref 3.5–5.3)

## 2011-05-22 LAB — HEPATIC FUNCTION PANEL
AST: 17 U/L (ref 0–37)
Bilirubin, Direct: 0.1 mg/dL (ref 0.0–0.3)
Total Bilirubin: 0.6 mg/dL (ref 0.3–1.2)

## 2011-05-22 LAB — LIPID PANEL: Total CHOL/HDL Ratio: 2.6 Ratio

## 2011-05-23 LAB — MICROALBUMIN / CREATININE URINE RATIO
Creatinine, Urine: 96 mg/dL
Microalb, Ur: 0.5 mg/dL (ref 0.00–1.89)

## 2011-05-27 ENCOUNTER — Telehealth: Payer: Self-pay | Admitting: *Deleted

## 2011-05-27 NOTE — Telephone Encounter (Signed)
Patient called and left voice message requesting a copy of his labs results mailed to him.  Lab results printed and mailed to address on file.

## 2011-05-28 ENCOUNTER — Ambulatory Visit: Payer: Medicare Other | Admitting: Internal Medicine

## 2011-08-13 LAB — BASIC METABOLIC PANEL
CO2: 24 mEq/L (ref 19–32)
Calcium: 9.9 mg/dL (ref 8.4–10.5)
Chloride: 104 mEq/L (ref 96–112)
Creat: 1.11 mg/dL (ref 0.50–1.35)
Glucose, Bld: 154 mg/dL — ABNORMAL HIGH (ref 70–99)
Potassium: 4.9 mEq/L (ref 3.5–5.3)

## 2011-08-13 LAB — HEMOGLOBIN A1C: Hgb A1c MFr Bld: 6.9 % — ABNORMAL HIGH (ref <5.7)

## 2011-08-13 NOTE — Telephone Encounter (Signed)
Addended by: Mervin Kung A on: 08/13/2011 09:39 AM   Modules accepted: Orders

## 2011-08-13 NOTE — Telephone Encounter (Signed)
Addended by: Mervin Kung A on: 08/13/2011 09:42 AM   Modules accepted: Orders

## 2011-08-18 ENCOUNTER — Ambulatory Visit (INDEPENDENT_AMBULATORY_CARE_PROVIDER_SITE_OTHER): Payer: Medicare Other | Admitting: Internal Medicine

## 2011-08-18 ENCOUNTER — Encounter: Payer: Self-pay | Admitting: Internal Medicine

## 2011-08-18 ENCOUNTER — Telehealth: Payer: Self-pay | Admitting: Internal Medicine

## 2011-08-18 DIAGNOSIS — E119 Type 2 diabetes mellitus without complications: Secondary | ICD-10-CM

## 2011-08-18 DIAGNOSIS — M79645 Pain in left finger(s): Secondary | ICD-10-CM | POA: Insufficient documentation

## 2011-08-18 DIAGNOSIS — E785 Hyperlipidemia, unspecified: Secondary | ICD-10-CM

## 2011-08-18 DIAGNOSIS — M6530 Trigger finger, unspecified finger: Secondary | ICD-10-CM

## 2011-08-18 MED ORDER — METFORMIN HCL 1000 MG PO TABS: 1000.0000 mg | ORAL_TABLET | Freq: Two times a day (BID) | ORAL | Status: DC

## 2011-08-18 NOTE — Progress Notes (Signed)
  Subjective:    Patient ID: [name], male    DOB: [birth year], 77 y.o.   MRN: [id number]  HPI Pt presents to clinic for followup of multiple medical problems. A1c improved slightly but fsbs remain mildly elevated. No hypoglycemia. Tolerating metformin without adverse effect. Notes chronic intermittent trigger finger involving right second finger. No trauma/injury. Sx's improved temporarily with ibuprofen but returned. No other alleviating or exacerbating factors. No other complaints. Wants yearly psa screening. Last psa nl 7/12.  Past Medical History  Diagnosis Date  . Diabetes mellitus 2000    type 2  . Hyperlipidemia   . GERD (gastroesophageal reflux disease)   . Glaucoma     followed by Dr Melene Muller at [facility]  . BPH (benign prostatic hypertrophy)    No past surgical history on file.  reports that he has never smoked. He has never used smokeless tobacco. He reports that he drinks alcohol. His drug history not on file. family history includes Cancer in his sister. No Known Allergies    Review of Systems see hpi    Objective:   Physical Exam  Physical Exam  Nursing note and vitals reviewed. Constitutional: Appears well-developed and well-nourished. No distress.  HENT:  Head: Normocephalic and atraumatic.  Right Ear: External ear normal.  Left Ear: External ear normal.  Eyes: Conjunctivae are normal. No scleral icterus.  Neck: Neck supple. Carotid bruit is not present.  Cardiovascular: Normal rate, regular rhythm and normal heart sounds.  Exam reveals no gallop and no friction rub.   No murmur heard. Pulmonary/Chest: Effort normal and breath sounds normal. No respiratory distress. He has no wheezes. no rales.  Lymphadenopathy:    He has no cervical adenopathy.  Neurological:Alert.  Skin: Skin is warm and dry. Not diaphoretic.  Psychiatric: Has a normal mood and affect.  MSK: right 2nd finger- no effusion, erythema or bony abn. FROM       Assessment &  Plan:

## 2011-08-18 NOTE — Assessment & Plan Note (Signed)
Offered ortho referral. Not ready- will call when ready.

## 2011-08-18 NOTE — Telephone Encounter (Signed)
Lab orders entered for June 2013. 

## 2011-08-18 NOTE — Assessment & Plan Note (Signed)
Obtain lipid/lft prior to next visit 

## 2011-08-18 NOTE — Assessment & Plan Note (Signed)
Mildly suboptimal control. Increase metformin 1000mg  bid.

## 2011-08-18 NOTE — Patient Instructions (Signed)
Please schedule cbc, chem7, a1c, urine microalbumin 250.0 and lipid/lft 272.4 prior to next visit 

## 2011-08-19 ENCOUNTER — Telehealth: Payer: Self-pay | Admitting: Internal Medicine

## 2011-08-19 NOTE — Telephone Encounter (Signed)
Rx refill addressed 08/18/2011.

## 2011-08-21 ENCOUNTER — Telehealth: Payer: Self-pay | Admitting: Internal Medicine

## 2011-08-21 NOTE — Telephone Encounter (Signed)
Rx refill sent to pharmacy 08/18/2011.

## 2011-08-26 ENCOUNTER — Telehealth: Payer: Self-pay | Admitting: Internal Medicine

## 2011-08-26 NOTE — Telephone Encounter (Signed)
Patient returned phone call to verify that he is taking 1000 mg twice a day, Rx resubmitted to [facility] with updated instructions.

## 2011-08-26 NOTE — Telephone Encounter (Signed)
Call placed to patient at [phone], no answer. Left voice message for patient to return call regarding Metformin. Clarification of medication is needed to know if patient is taking twice a day or three times a day.

## 2011-08-26 NOTE — Telephone Encounter (Signed)
When he was in on the 11th his rx for metformin went to [facility] without  Directions as to how often and how much to take.  They will not fill the rx until they get an answer on the amount to take

## 2011-09-11 ENCOUNTER — Telehealth: Payer: Self-pay | Admitting: *Deleted

## 2011-09-11 ENCOUNTER — Ambulatory Visit: Payer: Medicare Other | Admitting: Internal Medicine

## 2011-09-11 MED ORDER — METFORMIN HCL 1000 MG PO TABS: 1000.0000 mg | ORAL_TABLET | Freq: Two times a day (BID) | ORAL | Status: DC

## 2011-09-11 NOTE — Telephone Encounter (Signed)
Call placed to [facility] [phone], spoke with [facility]. She stated clarification was needed on directions for medication, the Rx was cancelled.  CSR Misty [name] was informed Rx would be resubmitted to pharmacy to reflect current changes.  Rx for Metfromin re-sent with Metformin dosing one tablet twice a day.  Patient provided Janumet  Samples 50-1000 with instructions to take 1 tablet twice a day and to hold Januvia while taking. He was advised to resume his original dosing of medication once Metformin has been received. Patient was advised in office to check with [facility] regarding status of medication, and call back if there are any problems. Patient verbalized understanding.

## 2011-12-09 ENCOUNTER — Other Ambulatory Visit: Payer: Self-pay | Admitting: *Deleted

## 2011-12-09 DIAGNOSIS — E785 Hyperlipidemia, unspecified: Secondary | ICD-10-CM

## 2011-12-09 DIAGNOSIS — E119 Type 2 diabetes mellitus without complications: Secondary | ICD-10-CM

## 2011-12-09 LAB — LIPID PANEL
Cholesterol: 133 mg/dL (ref 0–200)
LDL Cholesterol: 68 mg/dL (ref 0–99)
Total CHOL/HDL Ratio: 2.7 Ratio
VLDL: 16 mg/dL (ref 0–40)

## 2011-12-09 LAB — BASIC METABOLIC PANEL
BUN: 19 mg/dL (ref 6–23)
CO2: 26 mEq/L (ref 19–32)
Chloride: 106 mEq/L (ref 96–112)
Creat: 0.98 mg/dL (ref 0.50–1.35)
Glucose, Bld: 152 mg/dL — ABNORMAL HIGH (ref 70–99)

## 2011-12-09 LAB — HEPATIC FUNCTION PANEL
ALT: 23 U/L (ref 0–53)
Bilirubin, Direct: 0.2 mg/dL (ref 0.0–0.3)

## 2011-12-09 LAB — CBC
HCT: 43.1 % (ref 39.0–52.0)
MCV: 84.7 fL (ref 78.0–100.0)
RDW: 13.7 % (ref 11.5–15.5)
WBC: 8.3 10*3/uL (ref 4.0–10.5)

## 2011-12-10 LAB — URINALYSIS, ROUTINE W REFLEX MICROSCOPIC
Bilirubin Urine: NEGATIVE
Glucose, UA: NEGATIVE mg/dL
Hgb urine dipstick: NEGATIVE
Ketones, ur: NEGATIVE mg/dL
Protein, ur: NEGATIVE mg/dL
Urobilinogen, UA: 0.2 mg/dL (ref 0.0–1.0)

## 2011-12-10 LAB — HEMOGLOBIN A1C: Hgb A1c MFr Bld: 7 % — ABNORMAL HIGH (ref <5.7)

## 2011-12-13 ENCOUNTER — Other Ambulatory Visit: Payer: Self-pay | Admitting: Internal Medicine

## 2011-12-15 ENCOUNTER — Encounter: Payer: Self-pay | Admitting: Internal Medicine

## 2011-12-15 ENCOUNTER — Ambulatory Visit (INDEPENDENT_AMBULATORY_CARE_PROVIDER_SITE_OTHER): Payer: Medicare Other | Admitting: Internal Medicine

## 2011-12-15 VITALS — BP 108/60 | HR 66 | Temp 97.9°F | Resp 18 | Ht 72.5 in | Wt 172.0 lb

## 2011-12-15 DIAGNOSIS — E119 Type 2 diabetes mellitus without complications: Secondary | ICD-10-CM

## 2011-12-15 DIAGNOSIS — E785 Hyperlipidemia, unspecified: Secondary | ICD-10-CM

## 2011-12-15 DIAGNOSIS — M6530 Trigger finger, unspecified finger: Secondary | ICD-10-CM

## 2011-12-15 MED ORDER — SITAGLIPTIN PHOSPHATE 100 MG PO TABS: 100.0000 mg | ORAL_TABLET | Freq: Every day | ORAL | Status: DC

## 2011-12-15 MED ORDER — SIMVASTATIN 40 MG PO TABS: 40.0000 mg | ORAL_TABLET | Freq: Every day | ORAL | Status: DC

## 2011-12-15 MED ORDER — RANITIDINE HCL 150 MG PO TABS: 150.0000 mg | ORAL_TABLET | Freq: Every day | ORAL | Status: DC

## 2011-12-15 NOTE — Telephone Encounter (Signed)
Rx refill sent to pharmacy. 

## 2011-12-15 NOTE — Patient Instructions (Signed)
Please schedule labs prior to next visit Chem7, a1c-250 and medicare psa (prostate cancer screening)

## 2011-12-15 NOTE — Progress Notes (Signed)
  Subjective:    Patient ID: [name], male    DOB: [birth year], 77 y.o.   MRN: [id number]  HPI Pt presents to clinic for followup of multiple medical problems. Was accidentally taking 2 januvia. Realized error and changed to qd late May. 30d fsbs avg 146. Continues to have right 2nd trigger finger that is intermittently painful and interferes with activities somewhat.   Past Medical History  Diagnosis Date  . Diabetes mellitus 2000    type 2  . Hyperlipidemia   . GERD (gastroesophageal reflux disease)   . Glaucoma     followed by Dr Melene Muller at [facility]  . BPH (benign prostatic hypertrophy)    No past surgical history on file.  reports that he has never smoked. He has never used smokeless tobacco. He reports that he drinks alcohol. His drug history not on file. family history includes Cancer in his sister. No Known Allergies    Review of Systems see hpi     Objective:   Physical Exam  Physical Exam  Nursing note and vitals reviewed. Constitutional: Appears well-developed and well-nourished. No distress.  HENT:  Head: Normocephalic and atraumatic.  Right Ear: External ear normal.  Left Ear: External ear normal.  Eyes: Conjunctivae are normal. No scleral icterus.  Neck: Neck supple. Carotid bruit is not present.  Cardiovascular: Normal rate, regular rhythm and normal heart sounds.  Exam reveals no gallop and no friction rub.   No murmur heard. Pulmonary/Chest: Effort normal and breath sounds normal. No respiratory distress. He has no wheezes. no rales.  Lymphadenopathy:    He has no cervical adenopathy.  Neurological:Alert.  Skin: Skin is warm and dry. Not diaphoretic.  Psychiatric: Has a normal mood and affect.  MSK: right 2nd trigger finger noted.     Assessment & Plan:

## 2011-12-21 NOTE — Assessment & Plan Note (Signed)
Average control. Continue current regimen. 

## 2011-12-21 NOTE — Assessment & Plan Note (Signed)
Ortho consult

## 2011-12-21 NOTE — Assessment & Plan Note (Signed)
Good control. Continue current regimen. 

## 2012-01-21 ENCOUNTER — Other Ambulatory Visit: Payer: Self-pay | Admitting: Internal Medicine

## 2012-01-21 ENCOUNTER — Telehealth: Payer: Self-pay | Admitting: *Deleted

## 2012-01-21 DIAGNOSIS — R972 Elevated prostate specific antigen [PSA]: Secondary | ICD-10-CM

## 2012-01-21 DIAGNOSIS — H9190 Unspecified hearing loss, unspecified ear: Secondary | ICD-10-CM

## 2012-01-21 DIAGNOSIS — Z125 Encounter for screening for malignant neoplasm of prostate: Secondary | ICD-10-CM

## 2012-01-21 DIAGNOSIS — E119 Type 2 diabetes mellitus without complications: Secondary | ICD-10-CM

## 2012-01-21 NOTE — Telephone Encounter (Signed)
done

## 2012-01-21 NOTE — Telephone Encounter (Signed)
Patient request referral to Audiology Clinic; he would like to have hearing test done/SLS Please advise.

## 2012-01-22 NOTE — Telephone Encounter (Signed)
Received incoming letter from patient requesting [facility] in [place] [[phone]]/[facility] has scheduled appointment & will contact patient.

## 2012-03-04 ENCOUNTER — Telehealth: Payer: Self-pay | Admitting: Internal Medicine

## 2012-03-04 ENCOUNTER — Ambulatory Visit (HOSPITAL_BASED_OUTPATIENT_CLINIC_OR_DEPARTMENT_OTHER)
Admission: RE | Admit: 2012-03-04 | Discharge: 2012-03-04 | Disposition: A | Discharge status: Home / Self Care | Payer: Medicare Other | Source: Ambulatory Visit | Attending: Internal Medicine | Admitting: Internal Medicine

## 2012-03-04 ENCOUNTER — Encounter: Payer: Self-pay | Admitting: Internal Medicine

## 2012-03-04 ENCOUNTER — Ambulatory Visit (INDEPENDENT_AMBULATORY_CARE_PROVIDER_SITE_OTHER): Payer: Medicare Other | Admitting: Internal Medicine

## 2012-03-04 VITALS — BP 92/50 | HR 63 | Temp 97.8°F | Resp 16 | Wt 174.2 lb

## 2012-03-04 DIAGNOSIS — M545 Low back pain, unspecified: Secondary | ICD-10-CM | POA: Insufficient documentation

## 2012-03-04 DIAGNOSIS — M549 Dorsalgia, unspecified: Secondary | ICD-10-CM

## 2012-03-04 DIAGNOSIS — Z125 Encounter for screening for malignant neoplasm of prostate: Secondary | ICD-10-CM

## 2012-03-04 DIAGNOSIS — M25559 Pain in unspecified hip: Secondary | ICD-10-CM

## 2012-03-04 MED ORDER — METHYLPREDNISOLONE 4 MG PO KIT: PACK | ORAL | Status: AC

## 2012-03-04 NOTE — Telephone Encounter (Signed)
Patient has upcoming appointment on 04/05/12. Please order labs for one week prior. He will be going to [facility] lab.

## 2012-03-11 NOTE — Telephone Encounter (Signed)
Future order placed and given to the lab:  Please schedule labs prior to next visit  Chem7, a1c-250 and medicare psa (prostate cancer screening)

## 2012-03-14 DIAGNOSIS — M549 Dorsalgia, unspecified: Secondary | ICD-10-CM | POA: Insufficient documentation

## 2012-03-14 DIAGNOSIS — M25552 Pain in left hip: Secondary | ICD-10-CM

## 2012-03-14 HISTORY — DX: Pain in left hip: M25.552

## 2012-03-14 NOTE — Progress Notes (Signed)
  Subjective:    Patient ID: [name], male    DOB: 08/20/1934, 77 y.o.   MRN: [id number]  HPI Pt presents to clinic for evaluation of back and leg pain. Notes several year h/o intermittent pain without injury with 2 month worsening.Denies leg weakness. Leg pain ?radicular vs right inguinal. Ibuprofen helps. stertching has helped in the past. No other alleviating or exacerbating factors. Total time of visit ~26 mins of which >50% spent in counseling.  Past Medical History  Diagnosis Date  . Diabetes mellitus 2000    type 2  . Hyperlipidemia   . GERD (gastroesophageal reflux disease)   . Glaucoma     followed by Dr Melene Muller at [facility]  . BPH (benign prostatic hypertrophy)    No past surgical history on file.  reports that he has never smoked. He has never used smokeless tobacco. He reports that he drinks alcohol. His drug history not on file. family history includes Cancer in his sister. No Known Allergies   Review of Systems see hpi     Objective:   Physical Exam  Nursing note and vitals reviewed. Constitutional: He appears well-developed and well-nourished. No distress.  HENT:  Head: Normocephalic and atraumatic.  Eyes: Conjunctivae are normal.  Musculoskeletal:       No midline ls tenderness or bony abn. Bilateral le strength 5/5. +right hip pain with int/ext rotation. No click. Gait nl.  Neurological: He is alert.  Skin: He is not diaphoretic.  Psychiatric: He has a normal mood and affect.          Assessment & Plan:

## 2012-03-14 NOTE — Assessment & Plan Note (Signed)
Obtain plain xray. Attempt medrol dosepak and PT. Followup if no improvement or worsening.

## 2012-03-14 NOTE — Assessment & Plan Note (Signed)
Obtain plain xra

## 2012-03-15 ENCOUNTER — Ambulatory Visit: Payer: Medicare Other | Admitting: Internal Medicine

## 2012-03-29 ENCOUNTER — Ambulatory Visit: Payer: Medicare Other | Attending: Internal Medicine | Admitting: Rehabilitation

## 2012-03-29 DIAGNOSIS — M2569 Stiffness of other specified joint, not elsewhere classified: Secondary | ICD-10-CM | POA: Insufficient documentation

## 2012-03-29 DIAGNOSIS — M545 Low back pain, unspecified: Secondary | ICD-10-CM | POA: Insufficient documentation

## 2012-03-29 DIAGNOSIS — M25559 Pain in unspecified hip: Secondary | ICD-10-CM | POA: Insufficient documentation

## 2012-03-29 DIAGNOSIS — IMO0001 Reserved for inherently not codable concepts without codable children: Secondary | ICD-10-CM | POA: Insufficient documentation

## 2012-03-31 ENCOUNTER — Ambulatory Visit: Payer: Medicare Other | Admitting: Physical Therapy

## 2012-04-02 LAB — BASIC METABOLIC PANEL
CO2: 27 mEq/L (ref 19–32)
Chloride: 106 mEq/L (ref 96–112)
Creat: 0.98 mg/dL (ref 0.50–1.35)
Potassium: 4.3 mEq/L (ref 3.5–5.3)

## 2012-04-02 LAB — HEMOGLOBIN A1C: Mean Plasma Glucose: 154 mg/dL — ABNORMAL HIGH (ref <117)

## 2012-04-02 NOTE — Addendum Note (Signed)
Addended by: Regis Bill on: 04/02/2012 03:13 PM   Modules accepted: Orders

## 2012-04-02 NOTE — Progress Notes (Signed)
Lab orders released/SLS 

## 2012-04-03 LAB — PSA, MEDICARE: PSA: 2.27 ng/mL (ref <=4.00)

## 2012-04-05 ENCOUNTER — Encounter: Payer: Self-pay | Admitting: Internal Medicine

## 2012-04-05 ENCOUNTER — Ambulatory Visit: Payer: Medicare Other | Admitting: Rehabilitation

## 2012-04-05 ENCOUNTER — Ambulatory Visit (INDEPENDENT_AMBULATORY_CARE_PROVIDER_SITE_OTHER): Payer: Medicare Other | Admitting: Internal Medicine

## 2012-04-05 VITALS — BP 96/66 | HR 58 | Temp 97.7°F | Resp 16 | Wt 167.5 lb

## 2012-04-05 DIAGNOSIS — Z79899 Other long term (current) drug therapy: Secondary | ICD-10-CM

## 2012-04-05 DIAGNOSIS — E785 Hyperlipidemia, unspecified: Secondary | ICD-10-CM

## 2012-04-05 DIAGNOSIS — E119 Type 2 diabetes mellitus without complications: Secondary | ICD-10-CM

## 2012-04-05 NOTE — Assessment & Plan Note (Signed)
Average control. Dietary modification. Recheck CBC, Chem-7, A1c, urine microalbumin and TSH prior to next visit

## 2012-04-05 NOTE — Patient Instructions (Addendum)
Please schedule fasting labs prior to next visit Cbc, chem7, a1c, urine microalbumin, tsh-250.00 and lipid/lft -272.4

## 2012-04-05 NOTE — Progress Notes (Signed)
  Subjective:    Patient ID: [name], male    DOB: February 26, 1935, 77 y.o.   MRN: [id number]  HPI Pt presents to clinic for followup of multiple medical problems. Notes improvement of back and hip pain with physical therapy. Plain radiographs demonstrated only mild degenerative changes. Has not required ibuprofen now for five days. Weight down since last visit feels well. A1c reviewed stable seven point zero.  Past Medical History  Diagnosis Date  . Diabetes mellitus 2000    type 2  . Hyperlipidemia   . GERD (gastroesophageal reflux disease)   . Glaucoma     followed by Dr Melene Muller at [facility]  . BPH (benign prostatic hypertrophy)    No past surgical history on file.  reports that he has never smoked. He has never used smokeless tobacco. He reports that he drinks alcohol. His drug history not on file. family history includes Cancer in his sister. No Known Allergies    Review of Systems see hpi     Objective:   Physical Exam  Physical Exam  Nursing note and vitals reviewed. Constitutional: Appears well-developed and well-nourished. No distress.  HENT:  Head: Normocephalic and atraumatic.  Right Ear: External ear normal.  Left Ear: External ear normal.  Eyes: Conjunctivae are normal. No scleral icterus.  Neck: Neck supple. Carotid bruit is not present.  Cardiovascular: Normal rate, regular rhythm and normal heart sounds.  Exam reveals no gallop and no friction rub.   No murmur heard. Pulmonary/Chest: Effort normal and breath sounds normal. No respiratory distress. He has no wheezes. no rales.  Lymphadenopathy:    He has no cervical adenopathy.  Neurological:Alert.  Skin: Skin is warm and dry. Not diaphoretic.  Psychiatric: Has a normal mood and affect.       Assessment & Plan:

## 2012-04-05 NOTE — Assessment & Plan Note (Signed)
Decrease Zocor 20 mg a day. Obtain fasting lipid profile and liver fracture test prior to next visit

## 2012-04-09 ENCOUNTER — Ambulatory Visit: Payer: Medicare Other | Attending: Internal Medicine | Admitting: Physical Therapy

## 2012-04-09 DIAGNOSIS — IMO0001 Reserved for inherently not codable concepts without codable children: Secondary | ICD-10-CM | POA: Insufficient documentation

## 2012-04-09 DIAGNOSIS — M545 Low back pain, unspecified: Secondary | ICD-10-CM | POA: Insufficient documentation

## 2012-04-09 DIAGNOSIS — M25559 Pain in unspecified hip: Secondary | ICD-10-CM | POA: Insufficient documentation

## 2012-04-09 DIAGNOSIS — M2569 Stiffness of other specified joint, not elsewhere classified: Secondary | ICD-10-CM | POA: Insufficient documentation

## 2012-04-12 ENCOUNTER — Other Ambulatory Visit: Payer: Self-pay

## 2012-04-12 MED ORDER — VARDENAFIL HCL 20 MG PO TABS: 20.0000 mg | ORAL_TABLET | Freq: Every day | ORAL | Status: DC | PRN

## 2012-04-12 MED ORDER — IPRATROPIUM BROMIDE 0.03 % NA SOLN: 2.0000 | Freq: Two times a day (BID) | NASAL | Status: DC

## 2012-04-14 ENCOUNTER — Ambulatory Visit: Payer: Medicare Other | Admitting: Rehabilitation

## 2012-04-16 ENCOUNTER — Ambulatory Visit: Payer: Medicare Other | Admitting: Rehabilitation

## 2012-04-19 ENCOUNTER — Ambulatory Visit: Payer: Medicare Other | Admitting: Rehabilitation

## 2012-04-23 ENCOUNTER — Ambulatory Visit: Payer: Medicare Other | Admitting: Rehabilitation

## 2012-04-26 ENCOUNTER — Ambulatory Visit: Payer: Medicare Other | Admitting: Rehabilitation

## 2012-05-12 ENCOUNTER — Ambulatory Visit: Payer: Medicare Other | Attending: Internal Medicine | Admitting: Rehabilitation

## 2012-05-12 DIAGNOSIS — M25559 Pain in unspecified hip: Secondary | ICD-10-CM | POA: Insufficient documentation

## 2012-05-12 DIAGNOSIS — IMO0001 Reserved for inherently not codable concepts without codable children: Secondary | ICD-10-CM | POA: Insufficient documentation

## 2012-05-12 DIAGNOSIS — M2569 Stiffness of other specified joint, not elsewhere classified: Secondary | ICD-10-CM | POA: Insufficient documentation

## 2012-05-12 DIAGNOSIS — M545 Low back pain, unspecified: Secondary | ICD-10-CM | POA: Insufficient documentation

## 2012-06-07 ENCOUNTER — Other Ambulatory Visit: Payer: Self-pay | Admitting: Internal Medicine

## 2012-06-08 NOTE — Telephone Encounter (Signed)
Rx to pharmacy/SLS 

## 2012-06-21 ENCOUNTER — Other Ambulatory Visit: Payer: Self-pay | Admitting: Internal Medicine

## 2012-06-21 NOTE — Telephone Encounter (Signed)
Rx to pharmacy/SLS 

## 2012-07-01 LAB — CBC WITH DIFFERENTIAL/PLATELET
Eosinophils Relative: 6 % — ABNORMAL HIGH (ref 0–5)
HCT: 42.9 % (ref 39.0–52.0)
Hemoglobin: 14.7 g/dL (ref 13.0–17.0)
Lymphocytes Relative: 21 % (ref 12–46)
Lymphs Abs: 1.5 10*3/uL (ref 0.7–4.0)
MCV: 84.8 fL (ref 78.0–100.0)
Monocytes Absolute: 0.9 10*3/uL (ref 0.1–1.0)
Monocytes Relative: 13 % — ABNORMAL HIGH (ref 3–12)
RBC: 5.06 MIL/uL (ref 4.22–5.81)
RDW: 14 % (ref 11.5–15.5)
WBC: 7.2 10*3/uL (ref 4.0–10.5)

## 2012-07-01 LAB — BASIC METABOLIC PANEL
BUN: 20 mg/dL (ref 6–23)
CO2: 28 mEq/L (ref 19–32)
Chloride: 105 mEq/L (ref 96–112)
Creat: 1 mg/dL (ref 0.50–1.35)

## 2012-07-01 LAB — HEPATIC FUNCTION PANEL
ALT: 17 U/L (ref 0–53)
AST: 14 U/L (ref 0–37)
Albumin: 4.3 g/dL (ref 3.5–5.2)
Alkaline Phosphatase: 54 U/L (ref 39–117)
Bilirubin, Direct: 0.1 mg/dL (ref 0.0–0.3)

## 2012-07-01 LAB — LIPID PANEL
Cholesterol: 144 mg/dL (ref 0–200)
HDL: 56 mg/dL (ref >39)
Total CHOL/HDL Ratio: 2.6 Ratio

## 2012-07-01 LAB — TSH: TSH: 1.463 u[IU]/mL (ref 0.350–4.500)

## 2012-07-01 NOTE — Addendum Note (Signed)
Addended by: Regis Bill on: 07/01/2012 08:56 AM   Modules accepted: Orders

## 2012-07-02 LAB — MICROALBUMIN / CREATININE URINE RATIO: Microalb Creat Ratio: 3.3 mg/g (ref 0.0–30.0)

## 2012-07-05 ENCOUNTER — Ambulatory Visit (INDEPENDENT_AMBULATORY_CARE_PROVIDER_SITE_OTHER): Payer: Medicare Other | Admitting: Internal Medicine

## 2012-07-05 ENCOUNTER — Encounter: Payer: Self-pay | Admitting: Internal Medicine

## 2012-07-05 VITALS — BP 116/64 | HR 58 | Temp 97.6°F | Resp 14 | Wt 171.8 lb

## 2012-07-05 DIAGNOSIS — E785 Hyperlipidemia, unspecified: Secondary | ICD-10-CM

## 2012-07-05 DIAGNOSIS — E119 Type 2 diabetes mellitus without complications: Secondary | ICD-10-CM

## 2012-07-05 NOTE — Progress Notes (Signed)
  Subjective:    Patient ID: [name], male    DOB: October 02, 1934, 77 y.o.   MRN: [id number]  HPI Pt presents to clinic for followup of multiple medical problems. BP reviewed as normotensive. A1C stable. Home fsbs log with 30d avg 135. No hypoglycemia. LDL remains at goal despite taking 1/2 40mg  zocor.   Past Medical History  Diagnosis Date  . Diabetes mellitus 2000    type 2  . Hyperlipidemia   . GERD (gastroesophageal reflux disease)   . Glaucoma     followed by Dr Melene Muller at [facility]  . BPH (benign prostatic hypertrophy)    No past surgical history on file.  reports that he has never smoked. He has never used smokeless tobacco. He reports that he drinks alcohol. His drug history not on file. family history includes Cancer in his sister. No Known Allergies   Review of Systems see hpi     Objective:   Physical Exam  Physical Exam  Nursing note and vitals reviewed. Constitutional: Appears well-developed and well-nourished. No distress.  HENT:  Head: Normocephalic and atraumatic.  Right Ear: External ear normal.  Left Ear: External ear normal.  Eyes: Conjunctivae are normal. No scleral icterus.  Neck: Neck supple. Carotid bruit is not present.  Cardiovascular: Normal rate, regular rhythm and normal heart sounds.  Exam reveals no gallop and no friction rub.   No murmur heard. Pulmonary/Chest: Effort normal and breath sounds normal. No respiratory distress. He has no wheezes. no rales.  Lymphadenopathy:    He has no cervical adenopathy.  Neurological:Alert.  Skin: Skin is warm and dry. Not diaphoretic.  Psychiatric: Has a normal mood and affect.        Assessment & Plan:

## 2012-07-05 NOTE — Assessment & Plan Note (Signed)
Average control. Continue current regimen. 

## 2012-07-05 NOTE — Assessment & Plan Note (Signed)
Continue current dosing. Remains under good control

## 2012-07-05 NOTE — Patient Instructions (Signed)
Please schedule labs prior to next visit Chem7, a1c-250.00 

## 2012-07-14 ENCOUNTER — Other Ambulatory Visit: Payer: Self-pay | Admitting: Internal Medicine

## 2012-07-14 NOTE — Telephone Encounter (Signed)
Rx to pharmacy/SLS 

## 2012-09-22 ENCOUNTER — Telehealth: Payer: Self-pay | Admitting: *Deleted

## 2012-09-22 DIAGNOSIS — E119 Type 2 diabetes mellitus without complications: Secondary | ICD-10-CM

## 2012-09-22 LAB — BASIC METABOLIC PANEL
Calcium: 9.2 mg/dL (ref 8.4–10.5)
Creat: 0.95 mg/dL (ref 0.50–1.35)
Glucose, Bld: 190 mg/dL — ABNORMAL HIGH (ref 70–99)
Sodium: 138 mEq/L (ref 135–145)

## 2012-09-22 LAB — HEMOGLOBIN A1C: Mean Plasma Glucose: 157 mg/dL — ABNORMAL HIGH (ref <117)

## 2012-09-22 NOTE — Telephone Encounter (Signed)
Pt presented to the lab. Orders entered per 06/2012 office note as below:  Please schedule labs prior to next visit  Chem7, a1c-250.00

## 2012-09-27 ENCOUNTER — Encounter: Payer: Self-pay | Admitting: Family Medicine

## 2012-09-27 ENCOUNTER — Ambulatory Visit (INDEPENDENT_AMBULATORY_CARE_PROVIDER_SITE_OTHER): Payer: Medicare Other | Admitting: Family Medicine

## 2012-09-27 VITALS — BP 112/72 | HR 66 | Temp 97.6°F | Ht 72.5 in | Wt 172.0 lb

## 2012-09-27 DIAGNOSIS — K219 Gastro-esophageal reflux disease without esophagitis: Secondary | ICD-10-CM

## 2012-09-27 DIAGNOSIS — E785 Hyperlipidemia, unspecified: Secondary | ICD-10-CM

## 2012-09-27 DIAGNOSIS — E119 Type 2 diabetes mellitus without complications: Secondary | ICD-10-CM

## 2012-09-27 MED ORDER — SIMVASTATIN 40 MG PO TABS: 40.0000 mg | ORAL_TABLET | Freq: Every day | ORAL | Status: DC

## 2012-09-27 MED ORDER — SIMVASTATIN 40 MG PO TABS
20.0000 mg | ORAL_TABLET | Freq: Every day | ORAL | Status: DC
Start: 2012-09-27 — End: 2013-05-02

## 2012-09-27 NOTE — Patient Instructions (Addendum)
Labs prior to visit lipid, renal, cbc, tsh, hepatic, hgba1c Try probiotics Digestive Advantage daily or a generic Add a fiber supplement such as Metamucil or Benefiber once or twice a day  Cholesterol Cholesterol is a white, waxy, fat-like protein needed by your body in small amounts. The liver makes all the cholesterol you need. It is carried from the liver by the blood through the blood vessels. Deposits (plaque) may build up on blood vessel walls. This makes the arteries narrower and stiffer. Plaque increases the risk for heart attack and stroke. You cannot feel your cholesterol level even if it is very high. The only way to know is by a blood test to check your lipid (fats) levels. Once you know your cholesterol levels, you should keep a record of the test results. Work with your caregiver to to keep your levels in the desired range. WHAT THE RESULTS MEAN:  Total cholesterol is a rough measure of all the cholesterol in your blood.  LDL is the so-called bad cholesterol. This is the type that deposits cholesterol in the walls of the arteries. You want this level to be low.  HDL is the good cholesterol because it cleans the arteries and carries the LDL away. You want this level to be high.  Triglycerides are fat that the body can either burn for energy or store. High levels are closely linked to heart disease. DESIRED LEVELS:  Total cholesterol below 200.  LDL below 100 for people at risk, below 70 for very high risk.  HDL above 50 is good, above 60 is best.  Triglycerides below 150. HOW TO LOWER YOUR CHOLESTEROL:  Diet.  Choose fish or white meat chicken and [place], roasted or baked. Limit fatty cuts of red meat, fried foods, and processed meats, such as sausage and lunch meat.  Eat lots of fresh fruits and vegetables. Choose whole grains, beans, pasta, potatoes and cereals.  Use only small amounts of olive, corn or canola oils. Avoid butter, mayonnaise, shortening or palm kernel  oils. Avoid foods with trans-fats.  Use skim/nonfat milk and low-fat/nonfat yogurt and cheeses. Avoid whole milk, cream, ice cream, egg yolks and cheeses. Healthy desserts include angel food cake, ginger snaps, animal crackers, hard candy, popsicles, and low-fat/nonfat frozen yogurt. Avoid pastries, cakes, pies and cookies.  Exercise.  A regular program helps decrease LDL and raises HDL.  Helps with weight control.  Do things that increase your activity level like gardening, walking, or taking the stairs.  Medication.  May be prescribed by your caregiver to help lowering cholesterol and the risk for heart disease.  You may need medicine even if your levels are normal if you have several risk factors. HOME CARE INSTRUCTIONS   Follow your diet and exercise programs as suggested by your caregiver.  Take medications as directed.  Have blood work done when your caregiver feels it is necessary. MAKE SURE YOU:   Understand these instructions.  Will watch your condition.  Will get help right away if you are not doing well or get worse. Document Released: 03/18/2001 Document Revised: 09/15/2011 Document Reviewed: 09/08/2007 [facility] Patient Information 2013 [place], [place].

## 2012-10-03 NOTE — Assessment & Plan Note (Signed)
Doing well with weight loss and dietary changes

## 2012-10-03 NOTE — Progress Notes (Signed)
Patient ID: [name], male   DOB: Jun 06, 1935, 78 y.o.   MRN: [id number] [name] [id number] [birth year] 10/03/2012      Progress Note-Follow Up  Subjective  Chief Complaint  Chief Complaint  Patient presents with  . Follow-up    3 month    HPI  Patient is a 78 year old male who is in today for followup. Has been actively trying to lose weight over the last several years. Has been exercising regularly and eating more heart healthy diet and overall says he feels well as a result. Denies any recent illness. Reports his cultures are generally been well-controlled brings in a log noting 113-170#. Morning he was 127. His only complaint today is some mild congestion. No fevers or chills. No chest pain, palpitations, shortness of breath, GI or GU complaints noted today  Past Medical History  Diagnosis Date  . Diabetes mellitus 2000    type 2  . Hyperlipidemia   . GERD (gastroesophageal reflux disease)   . Glaucoma     follow[facility] by Dr Mel[facility] Muller at [facility]  . BPH (benign prostatic hypertrophy)     History reviewed. No pertinent past surgical history.  Family History  Problem Relation Age of Onset  . Cancer Sister     breast    History   Social History  . Marital Status: Married    Spouse Name: [name]    Number of Children: 0  . Years of Education: N/A   Occupational History  . semi retired Engineer, production  .     Social History Main Topics  . Smoking status: Never Smoker   . Smokeless tobacco: Never Used  . Alcohol Use: Yes  . Drug Use: Not on file  . Sexually Active: Not on file   Other Topics Concern  . Not on file   Social History Narrative  . No narrative on file    Current Outpatient Prescriptions on File Prior to Visit  Medication Sig Dispense Refill  . brimonidine-timolol (COMBIGAN) 0.2-0.5 % ophthalmic solution Place 1 drop into both eyes 2 (two) times daily.        . brinzolamide (AZOPT) 1 % ophthalmic suspension Place 1 drop into both eyes 3  (three) times daily.        Marland Kitchen ipratropium (ATROVENT) 0.03 % nasal spray Place 2 sprays into the nose every 12 (twelve) hours.  90 mL  1  . JANUVIA 100 MG tablet TAKE 1 TABLET DAILY  90 tablet  1  . metFORMIN (GLUCOPHAGE) 1000 MG tablet TAKE 1 TABLET (1.000 MG TOTAL) TWICE A DAY WITH MEALS  180 tablet  1  . ranitidine (ZANTAC) 150 MG tablet TAKE 1 TABLET DAILY  90 tablet  1  . travoprost, benzalkonium, (TRAVATAN) 0.004 % ophthalmic solution 1 drop at bedtime.        . valACYclovir (VALTREX) 1000 MG tablet TAKE 1 TABLET DAILY  100 tablet  3  . vardenafil (LEVITRA) 20 MG tablet Take 1 tablet (20 mg total) by mouth daily as needed.  30 tablet  1   No current facility-administered medications on file prior to visit.    No Known Allergies  Review of Systems  Review of Systems  Constitutional: Negative for fever and malaise/fatigue.  HENT: Negative for congestion.   Eyes: Negative for discharge.  Respiratory: Negative for shortness of breath.   Cardiovascular: Negative for chest pain, palpitations and leg swelling.  Gastrointestinal: Negative for nausea, abdominal pain and diarrhea.  Genitourinary: Negative for dysuria.  Musculoskeletal:  Negative for falls.  Skin: Negative for rash.  Neurological: Negative for loss of consciousness and headaches.  Endo/Heme/Allergies: Negative for polydipsia.  Psychiatric/Behavioral: Negative for depression and suicidal ideas. The patient is not nervous/anxious and does not have insomnia.     Objective  BP 112/72  Pulse 66  Temp(Src) 97.6 F (36.4 C) (Oral)  Ht 6' 0.5" (1.842 m)  Wt 172 lb (78.019 kg)  BMI 22.99 kg/m2  SpO2 96%  Physical Exam  Physical Exam  Constitutional: He is oriented to person, place, and time and well-developed, well-nourished, and in no distress. No distress.  HENT:  Head: Normocephalic and atraumatic.  Eyes: Conjunctivae are normal.  Neck: Neck supple. No thyromegaly present.  Cardiovascular: Normal rate, regular  rhythm and normal heart sounds.   No murmur heard. Pulmonary/Chest: Effort normal and breath sounds normal. No respiratory distress.  Abdominal: He exhibits no distension and no mass. There is no tenderness.  Musculoskeletal: He exhibits no edema.  Neurological: He is alert and oriented to person, place, and time.  Skin: Skin is warm.  Psychiatric: Memory, affect and judgment normal.    Lab Results  Component Value Date   TSH 1.463 07/01/2012   Lab Results  Component Value Date   WBC 7.2 07/01/2012   HGB 14.7 07/01/2012   HCT 42.9 07/01/2012   MCV 84.8 07/01/2012   PLT 236 07/01/2012   Lab Results  Component Value Date   CREATININE 0.95 09/22/2012   BUN 18 09/22/2012   NA 138 09/22/2012   K 4.7 09/22/2012   CL 104 09/22/2012   CO2 25 09/22/2012   Lab Results  Component Value Date   ALT 17 07/01/2012   AST 14 07/01/2012   ALKPHOS 54 07/01/2012   BILITOT 0.5 07/01/2012   Lab Results  Component Value Date   CHOL 144 07/01/2012   Lab Results  Component Value Date   HDL 56 07/01/2012   Lab Results  Component Value Date   LDLCALC 71 07/01/2012   Lab Results  Component Value Date   TRIG 83 07/01/2012   Lab Results  Component Value Date   CHOLHDL 2.6 07/01/2012     Assessment & Plan  DIABETES MELLITUS, TYPE II Stable control with diet changes and meds, encouraged to continue minimizing simple carbs  HYPERLIPIDEMIA Tolerating Simvastatin 40 mg daily, avoid trans fats, add krill oil caps  GERD Doing well with weight loss and dietary changes

## 2012-10-03 NOTE — Assessment & Plan Note (Signed)
Stable control with diet changes and meds, encouraged to continue minimizing simple carbs

## 2012-10-03 NOTE — Assessment & Plan Note (Signed)
Tolerating Simvastatin 40 mg daily, avoid trans fats, add krill oil caps

## 2012-12-10 ENCOUNTER — Other Ambulatory Visit: Payer: Self-pay | Admitting: Internal Medicine

## 2012-12-15 ENCOUNTER — Telehealth: Payer: Self-pay

## 2012-12-15 ENCOUNTER — Other Ambulatory Visit: Payer: Self-pay | Admitting: Internal Medicine

## 2012-12-15 DIAGNOSIS — E785 Hyperlipidemia, unspecified: Secondary | ICD-10-CM

## 2012-12-15 DIAGNOSIS — E119 Type 2 diabetes mellitus without complications: Secondary | ICD-10-CM

## 2012-12-15 LAB — CBC
Hemoglobin: 14.1 g/dL (ref 13.0–17.0)
Platelets: 214 10*3/uL (ref 150–400)
RBC: 4.87 MIL/uL (ref 4.22–5.81)
WBC: 7.5 10*3/uL (ref 4.0–10.5)

## 2012-12-15 LAB — HEMOGLOBIN A1C: Mean Plasma Glucose: 151 mg/dL — ABNORMAL HIGH (ref <117)

## 2012-12-15 NOTE — Telephone Encounter (Signed)
Labs ordered.

## 2012-12-16 LAB — LIPID PANEL
HDL: 49 mg/dL (ref >39)
LDL Cholesterol: 71 mg/dL (ref 0–99)
Total CHOL/HDL Ratio: 2.8 Ratio
VLDL: 15 mg/dL (ref 0–40)

## 2012-12-16 LAB — RENAL FUNCTION PANEL
Albumin: 4.1 g/dL (ref 3.5–5.2)
Calcium: 9 mg/dL (ref 8.4–10.5)
Phosphorus: 4 mg/dL (ref 2.3–4.6)
Potassium: 4.3 mEq/L (ref 3.5–5.3)
Sodium: 140 mEq/L (ref 135–145)

## 2012-12-16 LAB — HEPATIC FUNCTION PANEL
Alkaline Phosphatase: 57 U/L (ref 39–117)
Indirect Bilirubin: 0.5 mg/dL (ref 0.0–0.9)
Total Bilirubin: 0.6 mg/dL (ref 0.3–1.2)

## 2012-12-16 LAB — TSH: TSH: 1.024 u[IU]/mL (ref 0.350–4.500)

## 2012-12-20 ENCOUNTER — Encounter: Payer: Self-pay | Admitting: Family Medicine

## 2012-12-20 ENCOUNTER — Ambulatory Visit (INDEPENDENT_AMBULATORY_CARE_PROVIDER_SITE_OTHER): Payer: Medicare Other | Admitting: Family Medicine

## 2012-12-20 VITALS — BP 110/56 | HR 60 | Temp 98.0°F | Ht 72.5 in | Wt 172.0 lb

## 2012-12-20 DIAGNOSIS — K219 Gastro-esophageal reflux disease without esophagitis: Secondary | ICD-10-CM

## 2012-12-20 DIAGNOSIS — E785 Hyperlipidemia, unspecified: Secondary | ICD-10-CM

## 2012-12-20 DIAGNOSIS — E119 Type 2 diabetes mellitus without complications: Secondary | ICD-10-CM

## 2012-12-20 NOTE — Assessment & Plan Note (Addendum)
Well controlled on current meds. No changes to meds. Minimize simple carbohydrates

## 2012-12-20 NOTE — Patient Instructions (Addendum)
  Labs prior to visit, hgba1c, lipid, renal, hepatic, cbc, tsh  Diabetes and Exercise Regular exercise is important and can help:   Control blood glucose (sugar).  Decrease blood pressure.    Control blood lipids (cholesterol, triglycerides).  Improve overall health. BENEFITS FROM EXERCISE  Improved fitness.  Improved flexibility.  Improved endurance.  Increased bone density.  Weight control.  Increased muscle strength.  Decreased body fat.  Improvement of the body's use of insulin, a hormone.  Increased insulin sensitivity.  Reduction of insulin needs.  Reduced stress and tension.  Helps you feel better. People with diabetes who add exercise to their lifestyle gain additional benefits, including:  Weight loss.  Reduced appetite.  Improvement of the body's use of blood glucose.  Decreased risk factors for heart disease:  Lowering of cholesterol and triglycerides.  Raising the level of good cholesterol (high-density lipoproteins, HDL).  Lowering blood sugar.  Decreased blood pressure. TYPE 1 DIABETES AND EXERCISE  Exercise will usually lower your blood glucose.  If blood glucose is greater than 240 mg/dl, check urine ketones. If ketones are present, do not exercise.  Location of the insulin injection sites may need to be adjusted with exercise. Avoid injecting insulin into areas of the body that will be exercised. For example, avoid injecting insulin into:  The arms when playing tennis.  The legs when jogging. For more information, discuss this with your caregiver.  Keep a record of:  Food intake.  Type and amount of exercise.  Expected peak times of insulin action.  Blood glucose levels. Do this before, during, and after exercise. Review your records with your caregiver. This will help you to develop guidelines for adjusting food intake and insulin amounts.  TYPE 2 DIABETES AND EXERCISE  Regular physical activity can help control blood  glucose.  Exercise is important because it may:  Increase the body's sensitivity to insulin.  Improve blood glucose control.  Exercise reduces the risk of heart disease. It decreases serum cholesterol and triglycerides. It also lowers blood pressure.  Those who take insulin or oral hypoglycemic agents should watch for signs of hypoglycemia. These signs include dizziness, shaking, sweating, chills, and confusion.  Body water is lost during exercise. It must be replaced. This will help to avoid loss of body fluids (dehydration) or heat stroke. Be sure to talk to your caregiver before starting an exercise program to make sure it is safe for you. Remember, any activity is better than none.  Document Released: 09/13/2003 Document Revised: 09/15/2011 Document Reviewed: 12/28/2008 [facility] Patient Information 2014 [place], [place].

## 2012-12-20 NOTE — Progress Notes (Signed)
Patient ID: [name], male   DOB: October 18, 1934, 78 y.o.   MRN: [id number] [name] [id number] [birth year] 12/20/2012      Progress Note-Follow Up  Subjective  Chief Complaint  Chief Complaint  Patient presents with  . Follow-up    3 month    HPI  Patient is a 78 year old male in today for followup. Since his last visit he started Metamucil and his bowels are moving well. Blood sugars have been well-controlled. He denies any recent illness. He is taking medications as prescribed. No headaches, chest pain, palpitations, shortness of breath, GI or GU complaints noted today. Appears well controlled with intermittent use ranitidine.  Past Medical History  Diagnosis Date  . Diabetes mellitus 2000    type 2  . Hyperlipidemia   . GERD (gastroesophageal reflux disease)   . Glaucoma     followed by Dr Melene Muller at [facility]  . BPH (benign prostatic hypertrophy)     History reviewed. No pertinent past surgical history.  Family History  Problem Relation Age of Onset  . Cancer Sister     breast    History   Social History  . Marital Status: Married    Spouse Name: [name]    Number of Children: 0  . Years of Education: N/A   Occupational History  . semi retired Engineer, production  .     Social History Main Topics  . Smoking status: Never Smoker   . Smokeless tobacco: Never Used  . Alcohol Use: Yes  . Drug Use: Not on file  . Sexually Active: Not on file   Other Topics Concern  . Not on file   Social History Narrative  . No narrative on file    Current Outpatient Prescriptions on File Prior to Visit  Medication Sig Dispense Refill  . brimonidine-timolol (COMBIGAN) 0.2-0.5 % ophthalmic solution Place 1 drop into both eyes 2 (two) times daily.        . brinzolamide (AZOPT) 1 % ophthalmic suspension Place 1 drop into both eyes 3 (three) times daily.        Marland Kitchen ipratropium (ATROVENT) 0.03 % nasal spray Place 2 sprays into the nose every 12 (twelve) hours.  90 mL  1   . JANUVIA 100 MG tablet TAKE 1 TABLET DAILY  90 tablet  1  . metFORMIN (GLUCOPHAGE) 1000 MG tablet TAKE 1 TABLET TWICE A DAY WITH MEALS  180 tablet  0  . ranitidine (ZANTAC) 150 MG tablet TAKE 1 TABLET DAILY  90 tablet  1  . simvastatin (ZOCOR) 40 MG tablet Take 0.5 tablets (20 mg total) by mouth at bedtime.  90 tablet  1  . travoprost, benzalkonium, (TRAVATAN) 0.004 % ophthalmic solution 1 drop at bedtime.        . valACYclovir (VALTREX) 1000 MG tablet TAKE 1 TABLET DAILY  100 tablet  3  . vardenafil (LEVITRA) 20 MG tablet Take 1 tablet (20 mg total) by mouth daily as needed.  30 tablet  1   No current facility-administered medications on file prior to visit.    No Known Allergies  Review of Systems  Review of Systems  Constitutional: Negative for fever and malaise/fatigue.  HENT: Negative for congestion.   Eyes: Negative for discharge.  Respiratory: Negative for shortness of breath.   Cardiovascular: Negative for chest pain, palpitations and leg swelling.  Gastrointestinal: Negative for nausea, abdominal pain and diarrhea.  Genitourinary: Negative for dysuria.  Musculoskeletal: Negative for falls.  Skin: Negative for rash.  Neurological:  Negative for loss of consciousness and headaches.  Endo/Heme/Allergies: Negative for polydipsia.  Psychiatric/Behavioral: Negative for depression and suicidal ideas. The patient is not nervous/anxious and does not have insomnia.     Objective  BP 110/56  Pulse 60  Temp(Src) 98 F (36.7 C) (Oral)  Ht 6' 0.5" (1.842 m)  Wt 172 lb (78.019 kg)  BMI 22.99 kg/m2  SpO2 97%  Physical Exam  Physical Exam  Constitutional: He is oriented to person, place, and time and well-developed, well-nourished, and in no distress. No distress.  HENT:  Head: Normocephalic and atraumatic.  Eyes: Conjunctivae are normal.  Neck: Neck supple. No thyromegaly present.  Cardiovascular: Normal rate, regular rhythm and normal heart sounds.   No murmur  heard. Pulmonary/Chest: Effort normal and breath sounds normal. No respiratory distress.  Abdominal: He exhibits no distension and no mass. There is no tenderness.  Musculoskeletal: He exhibits no edema.  Neurological: He is alert and oriented to person, place, and time.  Skin: Skin is warm.  Psychiatric: Memory, affect and judgment normal.    Lab Results  Component Value Date   TSH 1.024 12/15/2012   Lab Results  Component Value Date   WBC 7.5 12/15/2012   HGB 14.1 12/15/2012   HCT 41.7 12/15/2012   MCV 85.6 12/15/2012   PLT 214 12/15/2012   Lab Results  Component Value Date   CREATININE 0.95 12/15/2012   BUN 19 12/15/2012   NA 140 12/15/2012   K 4.3 12/15/2012   CL 105 12/15/2012   CO2 27 12/15/2012   Lab Results  Component Value Date   ALT 15 12/15/2012   AST 16 12/15/2012   ALKPHOS 57 12/15/2012   BILITOT 0.6 12/15/2012   Lab Results  Component Value Date   CHOL 135 12/15/2012   Lab Results  Component Value Date   HDL 49 12/15/2012   Lab Results  Component Value Date   LDLCALC 71 12/15/2012   Lab Results  Component Value Date   TRIG 74 12/15/2012   Lab Results  Component Value Date   CHOLHDL 2.8 12/15/2012     Assessment & Plan  DIABETES MELLITUS, TYPE II Well controlled on current meds. No changes to meds. Minimize simple carbohydrates  HYPERLIPIDEMIA Well controlled on Simvastatin, continue fiber supplements.  GERD Well controlled, no changes to regimen, continue Ranitidine prn, avoid offending foods.

## 2012-12-22 NOTE — Assessment & Plan Note (Signed)
Well controlled on Simvastatin, continue fiber supplements.

## 2012-12-22 NOTE — Assessment & Plan Note (Signed)
Well controlled, no changes to regimen, continue Ranitidine prn, avoid offending foods.

## 2013-01-13 ENCOUNTER — Other Ambulatory Visit: Payer: Self-pay

## 2013-01-18 ENCOUNTER — Other Ambulatory Visit: Payer: Self-pay | Admitting: Internal Medicine

## 2013-01-19 ENCOUNTER — Other Ambulatory Visit: Payer: Self-pay | Admitting: *Deleted

## 2013-01-19 DIAGNOSIS — K219 Gastro-esophageal reflux disease without esophagitis: Secondary | ICD-10-CM

## 2013-01-19 MED ORDER — RANITIDINE HCL 150 MG PO TABS: ORAL_TABLET | ORAL | Status: DC

## 2013-01-19 NOTE — Telephone Encounter (Signed)
Refill for zantac sent to [facility].

## 2013-03-23 ENCOUNTER — Other Ambulatory Visit: Payer: Self-pay | Admitting: Family Medicine

## 2013-04-18 ENCOUNTER — Ambulatory Visit: Payer: Medicare Other | Admitting: Family Medicine

## 2013-04-27 ENCOUNTER — Telehealth: Payer: Self-pay | Admitting: *Deleted

## 2013-04-27 DIAGNOSIS — E785 Hyperlipidemia, unspecified: Secondary | ICD-10-CM

## 2013-04-27 DIAGNOSIS — E119 Type 2 diabetes mellitus without complications: Secondary | ICD-10-CM

## 2013-04-27 LAB — TSH: TSH: 1.085 u[IU]/mL (ref 0.350–4.500)

## 2013-04-27 LAB — HEPATIC FUNCTION PANEL
ALT: 17 U/L (ref 0–53)
Bilirubin, Direct: 0.2 mg/dL (ref 0.0–0.3)
Indirect Bilirubin: 0.5 mg/dL (ref 0.0–0.9)
Total Bilirubin: 0.7 mg/dL (ref 0.3–1.2)

## 2013-04-27 LAB — CBC
HCT: 43.5 % (ref 39.0–52.0)
Hemoglobin: 14.9 g/dL (ref 13.0–17.0)
RBC: 4.95 MIL/uL (ref 4.22–5.81)
WBC: 6.7 10*3/uL (ref 4.0–10.5)

## 2013-04-27 LAB — LIPID PANEL
Cholesterol: 126 mg/dL (ref 0–200)
VLDL: 17 mg/dL (ref 0–40)

## 2013-04-27 LAB — RENAL FUNCTION PANEL
Calcium: 9.2 mg/dL (ref 8.4–10.5)
Chloride: 105 mEq/L (ref 96–112)
Phosphorus: 3.6 mg/dL (ref 2.3–4.6)
Potassium: 4.5 mEq/L (ref 3.5–5.3)
Sodium: 139 mEq/L (ref 135–145)

## 2013-04-27 LAB — HEMOGLOBIN A1C
Hgb A1c MFr Bld: 7 % — ABNORMAL HIGH (ref <5.7)
Mean Plasma Glucose: 154 mg/dL — ABNORMAL HIGH (ref <117)

## 2013-04-27 NOTE — Telephone Encounter (Signed)
Pt presented to the lab. Orders entered per 12/2012 office note as below:  Labs prior to visit, hgba1c, lipid, renal, hepatic, cbc, tsh

## 2013-04-29 ENCOUNTER — Other Ambulatory Visit: Payer: Self-pay | Admitting: Internal Medicine

## 2013-04-29 NOTE — Telephone Encounter (Signed)
Rx request to pharmacy/[facility]  

## 2013-05-02 ENCOUNTER — Encounter: Payer: Self-pay | Admitting: Family Medicine

## 2013-05-02 ENCOUNTER — Ambulatory Visit (INDEPENDENT_AMBULATORY_CARE_PROVIDER_SITE_OTHER): Payer: Medicare Other | Admitting: Family Medicine

## 2013-05-02 VITALS — BP 110/58 | HR 59 | Temp 97.6°F | Ht 72.5 in | Wt 169.1 lb

## 2013-05-02 DIAGNOSIS — E119 Type 2 diabetes mellitus without complications: Secondary | ICD-10-CM

## 2013-05-02 DIAGNOSIS — J300 Vasomotor rhinitis: Secondary | ICD-10-CM | POA: Insufficient documentation

## 2013-05-02 DIAGNOSIS — J309 Allergic rhinitis, unspecified: Secondary | ICD-10-CM

## 2013-05-02 DIAGNOSIS — E785 Hyperlipidemia, unspecified: Secondary | ICD-10-CM

## 2013-05-02 DIAGNOSIS — K219 Gastro-esophageal reflux disease without esophagitis: Secondary | ICD-10-CM

## 2013-05-02 DIAGNOSIS — Z23 Encounter for immunization: Secondary | ICD-10-CM

## 2013-05-02 HISTORY — DX: Vasomotor rhinitis: J30.0

## 2013-05-02 MED ORDER — SIMVASTATIN 10 MG PO TABS: 10.0000 mg | ORAL_TABLET | Freq: Every day | ORAL | Status: DC

## 2013-05-02 NOTE — Assessment & Plan Note (Signed)
Well controlled on current meds and diet.

## 2013-05-02 NOTE — Patient Instructions (Signed)

## 2013-05-02 NOTE — Progress Notes (Signed)
Patient ID: [name], male   DOB: Dec 03, 1934, 78 y.o.   MRN: [id number] [name] [id number] April 14, 1935 05/02/2013      Progress Note-Follow Up  Subjective  Chief Complaint  Chief Complaint  Patient presents with  . Follow-up  . Injections    flu- high dose    HPI  Patient is a 78 year old Caucasian male who is in today for three-month followup. He feels well. His blood sugars range from 120-200. Most numbers in the 120s and 130s. The only time he goes above 200 his when traveling. Exercises regularly tries to maintain a heart healthy diet. No complaints of reflux, chest pain or palpitations, shortness of breath, GI or GU concerns. Struggles with frequent post good response to intranasal ipratropium when needed. Denies polyuria polydipsia taking medications as prescribed  Past Medical History  Diagnosis Date  . Diabetes mellitus 2000    type 2  . Hyperlipidemia   . GERD (gastroesophageal reflux disease)   . Glaucoma     followed by Dr Melene Muller at [facility]  . BPH (benign prostatic hypertrophy)     History reviewed. No pertinent past surgical history.  Family History  Problem Relation Age of Onset  . Cancer Sister     breast    History   Social History  . Marital Status: Married    Spouse Name: [name]    Number of Children: 0  . Years of Education: N/A   Occupational History  . semi retired Engineer, production  .     Social History Main Topics  . Smoking status: Never Smoker   . Smokeless tobacco: Never Used  . Alcohol Use: Yes  . Drug Use: Not on file  . Sexual Activity: Not on file   Other Topics Concern  . Not on file   Social History Narrative  . No narrative on file    Current Outpatient Prescriptions on File Prior to Visit  Medication Sig Dispense Refill  . brimonidine-timolol (COMBIGAN) 0.2-0.5 % ophthalmic solution Place 1 drop into both eyes 2 (two) times daily.        . brinzolamide (AZOPT) 1 % ophthalmic suspension Place 1 drop into  both eyes 3 (three) times daily.        Marland Kitchen ipratropium (ATROVENT) 0.03 % nasal spray Place 2 sprays into the nose every 12 (twelve) hours.  90 mL  1  . JANUVIA 100 MG tablet TAKE 1 TABLET DAILY  90 tablet  1  . metFORMIN (GLUCOPHAGE) 1000 MG tablet TAKE 1 TABLET TWICE A DAY WITH MEALS  180 tablet  2  . ranitidine (ZANTAC) 150 MG tablet TAKE 1 TABLET DAILY  90 tablet  3  . simvastatin (ZOCOR) 40 MG tablet Take 0.5 tablets (20 mg total) by mouth at bedtime.  90 tablet  1  . travoprost, benzalkonium, (TRAVATAN) 0.004 % ophthalmic solution 1 drop at bedtime.        . valACYclovir (VALTREX) 1000 MG tablet TAKE 1 TABLET DAILY  90 tablet  1  . vardenafil (LEVITRA) 20 MG tablet Take 1 tablet (20 mg total) by mouth daily as needed.  30 tablet  1   No current facility-administered medications on file prior to visit.    No Known Allergies  Review of Systems  Review of Systems  Constitutional: Negative for fever and malaise/fatigue.  HENT: Negative for congestion.   Eyes: Negative for discharge.  Respiratory: Negative for shortness of breath.   Cardiovascular: Negative for chest pain, palpitations and leg swelling.  Gastrointestinal: Negative for nausea, abdominal pain and diarrhea.  Genitourinary: Negative for dysuria.  Musculoskeletal: Negative for falls.  Skin: Negative for rash.  Neurological: Negative for loss of consciousness and headaches.  Endo/Heme/Allergies: Negative for polydipsia.  Psychiatric/Behavioral: Negative for depression and suicidal ideas. The patient is not nervous/anxious and does not have insomnia.     Objective  BP 110/58  Pulse 59  Temp(Src) 97.6 F (36.4 C) (Oral)  Ht 6' 0.5" (1.842 m)  Wt 169 lb 1.9 oz (76.712 kg)  BMI 22.61 kg/m2  SpO2 99%  Physical Exam  .Physical Exam  Constitutional: He is oriented to person, place, and time and well-developed, well-nourished, and in no distress. No distress.  HENT:  Head: Normocephalic and atraumatic.  Eyes:  Conjunctivae are normal.  Neck: Neck supple. No thyromegaly present.  Cardiovascular: Normal rate, regular rhythm and normal heart sounds.   No murmur heard. Pulmonary/Chest: Effort normal and breath sounds normal. No respiratory distress.  Abdominal: He exhibits no distension and no mass. There is no tenderness.  Musculoskeletal: He exhibits no edema.  Neurological: He is alert and oriented to person, place, and time.  Skin: Skin is warm.  Psychiatric: Memory, affect and judgment normal.    Lab Results  Component Value Date   TSH 1.085 04/27/2013   Lab Results  Component Value Date   WBC 6.7 04/27/2013   HGB 14.9 04/27/2013   HCT 43.5 04/27/2013   MCV 87.9 04/27/2013   PLT 218 04/27/2013   Lab Results  Component Value Date   CREATININE 1.05 04/27/2013   BUN 17 04/27/2013   NA 139 04/27/2013   K 4.5 04/27/2013   CL 105 04/27/2013   CO2 28 04/27/2013   Lab Results  Component Value Date   ALT 17 04/27/2013   AST 16 04/27/2013   ALKPHOS 48 04/27/2013   BILITOT 0.7 04/27/2013   Lab Results  Component Value Date   CHOL 126 04/27/2013   Lab Results  Component Value Date   HDL 50 04/27/2013   Lab Results  Component Value Date   LDLCALC 59 04/27/2013   Lab Results  Component Value Date   TRIG 87 04/27/2013   Lab Results  Component Value Date   CHOLHDL 2.5 04/27/2013     Assessment & Plan  GERD Well controlled on current meds and diet   DIABETES MELLITUS, TYPE II Adequate control with hgba1c 7.0. Watch the carbs. Low numbers in the 120s, one number above.  HYPERLIPIDEMIA Has been taking 20 mg of simvastatin daily. Good control. Will to simvastatin to 10 mg daily and reassess in 3 months time.  Vasomotor rhinitis He uses ipratropium nasal when necessary with good results but unfortunately increased drying of his eyes. Will use the medication sparingly

## 2013-05-02 NOTE — Assessment & Plan Note (Signed)
He uses ipratropium nasal when necessary with good results but unfortunately increased drying of his eyes. Will use the medication sparingly

## 2013-05-02 NOTE — Assessment & Plan Note (Signed)
Has been taking 20 mg of simvastatin daily. Good control. Will to simvastatin to 10 mg daily and reassess in 3 months time.

## 2013-05-02 NOTE — Assessment & Plan Note (Signed)
Adequate control with hgba1c 7.0. Watch the carbs. Low numbers in the 120s, one number above.

## 2013-06-07 ENCOUNTER — Other Ambulatory Visit: Payer: Self-pay | Admitting: Family Medicine

## 2013-07-27 ENCOUNTER — Telehealth: Payer: Self-pay

## 2013-07-27 DIAGNOSIS — E785 Hyperlipidemia, unspecified: Secondary | ICD-10-CM

## 2013-07-27 DIAGNOSIS — E119 Type 2 diabetes mellitus without complications: Secondary | ICD-10-CM

## 2013-07-27 NOTE — Telephone Encounter (Signed)
Please advise which labs are to be ran. Pt came in today.

## 2013-07-27 NOTE — Telephone Encounter (Signed)
Renal, cbc, hepatic, htn, hgba1c, tsh, lipid

## 2013-07-28 LAB — LIPID PANEL
CHOL/HDL RATIO: 2.8 ratio
Cholesterol: 150 mg/dL (ref 0–200)
HDL: 54 mg/dL (ref >39)
LDL CALC: 80 mg/dL (ref 0–99)
TRIGLYCERIDES: 80 mg/dL (ref <150)
VLDL: 16 mg/dL (ref 0–40)

## 2013-07-28 LAB — RENAL FUNCTION PANEL
Albumin: 4.1 g/dL (ref 3.5–5.2)
BUN: 16 mg/dL (ref 6–23)
CHLORIDE: 103 meq/L (ref 96–112)
CO2: 27 mEq/L (ref 19–32)
Calcium: 9.2 mg/dL (ref 8.4–10.5)
Creat: 0.98 mg/dL (ref 0.50–1.35)
GLUCOSE: 166 mg/dL — AB (ref 70–99)
PHOSPHORUS: 3.6 mg/dL (ref 2.3–4.6)
POTASSIUM: 4.6 meq/L (ref 3.5–5.3)
Sodium: 139 mEq/L (ref 135–145)

## 2013-07-28 LAB — CBC
HEMATOCRIT: 42.1 % (ref 39.0–52.0)
HEMOGLOBIN: 14.4 g/dL (ref 13.0–17.0)
MCH: 29.3 pg (ref 26.0–34.0)
MCHC: 34.2 g/dL (ref 30.0–36.0)
MCV: 85.6 fL (ref 78.0–100.0)
Platelets: 203 10*3/uL (ref 150–400)
RBC: 4.92 MIL/uL (ref 4.22–5.81)
RDW: 14.3 % (ref 11.5–15.5)
WBC: 4.8 10*3/uL (ref 4.0–10.5)

## 2013-07-28 LAB — HEPATIC FUNCTION PANEL
ALK PHOS: 54 U/L (ref 39–117)
ALT: 15 U/L (ref 0–53)
AST: 13 U/L (ref 0–37)
Albumin: 4.1 g/dL (ref 3.5–5.2)
BILIRUBIN DIRECT: 0.1 mg/dL (ref 0.0–0.3)
Indirect Bilirubin: 0.4 mg/dL (ref 0.0–0.9)
Total Bilirubin: 0.5 mg/dL (ref 0.3–1.2)
Total Protein: 6.3 g/dL (ref 6.0–8.3)

## 2013-07-28 LAB — TSH: TSH: 1.111 u[IU]/mL (ref 0.350–4.500)

## 2013-07-28 NOTE — Telephone Encounter (Signed)
Lab order placed.

## 2013-07-29 LAB — HEMOGLOBIN A1C
HEMOGLOBIN A1C: 7.2 % — AB (ref <5.7)
Mean Plasma Glucose: 160 mg/dL — ABNORMAL HIGH (ref <117)

## 2013-08-04 ENCOUNTER — Ambulatory Visit (INDEPENDENT_AMBULATORY_CARE_PROVIDER_SITE_OTHER): Payer: Medicare Other | Admitting: Family Medicine

## 2013-08-04 ENCOUNTER — Encounter: Payer: Self-pay | Admitting: Family Medicine

## 2013-08-04 VITALS — BP 102/55 | HR 58 | Temp 98.1°F | Ht 72.5 in | Wt 171.0 lb

## 2013-08-04 DIAGNOSIS — E119 Type 2 diabetes mellitus without complications: Secondary | ICD-10-CM

## 2013-08-04 DIAGNOSIS — H409 Unspecified glaucoma: Secondary | ICD-10-CM

## 2013-08-04 DIAGNOSIS — E785 Hyperlipidemia, unspecified: Secondary | ICD-10-CM

## 2013-08-04 DIAGNOSIS — R350 Frequency of micturition: Secondary | ICD-10-CM

## 2013-08-04 DIAGNOSIS — N400 Enlarged prostate without lower urinary tract symptoms: Secondary | ICD-10-CM

## 2013-08-04 MED ORDER — TAMSULOSIN HCL 0.4 MG PO CAPS: 0.4000 mg | ORAL_CAPSULE | Freq: Every day | ORAL | Status: DC

## 2013-08-04 MED ORDER — METFORMIN HCL 1000 MG PO TABS: ORAL_TABLET | ORAL | Status: DC

## 2013-08-04 NOTE — Progress Notes (Signed)
Pre visit review using our clinic review tool, if applicable. No additional management support is needed unless otherwise documented below in the visit note. 

## 2013-08-04 NOTE — Progress Notes (Signed)
Patient ID: [name], male   DOB: 12/24/1934, 79 y.o.   MRN: [id number] [name] [id number] 1935/01/20 08/04/2013      Progress Note-Follow Up  Subjective  Chief Complaint  Chief Complaint  Patient presents with  . Follow-up    3 month    HPI  Patient is a 79 year old Caucasian male who is in today for followup. He is feeling well. He is tolerating his Zocor. Denies any recent illness. Denies any swelling gets here but acknowledges some polyuria. Wakes several times a night. No recent vision or hearing changes. No chest pain, palpitations or shortness of breath. No GI complaints.  Past Medical History  Diagnosis Date  . Diabetes mellitus 2000    type 2  . Hyperlipidemia   . GERD (gastroesophageal reflux disease)   . Glaucoma     followed by Dr Lanell Matar at [facility]  . BPH (benign prostatic hypertrophy)   . Vasomotor rhinitis 05/02/2013    History reviewed. No pertinent past surgical history.  Family History  Problem Relation Age of Onset  . Cancer Sister     breast    History   Social History  . Marital Status: Married    Spouse Name: [name]    Number of Children: 0  . Years of Education: N/A   Occupational History  . semi retired Education administrator  .     Social History Main Topics  . Smoking status: Never Smoker   . Smokeless tobacco: Never Used  . Alcohol Use: Yes  . Drug Use: Not on file  . Sexual Activity: Not on file   Other Topics Concern  . Not on file   Social History Narrative  . No narrative on file    Current Outpatient Prescriptions on File Prior to Visit  Medication Sig Dispense Refill  . brimonidine-timolol (COMBIGAN) 0.2-0.5 % ophthalmic solution Place 1 drop into both eyes 2 (two) times daily.        . brinzolamide (AZOPT) 1 % ophthalmic suspension Place 1 drop into both eyes 3 (three) times daily.        Marland Kitchen ipratropium (ATROVENT) 0.03 % nasal spray Place 2 sprays into the nose every 12 (twelve) hours.  90 mL  1  . JANUVIA  100 MG tablet TAKE 1 TABLET DAILY  90 tablet  1  . metFORMIN (GLUCOPHAGE) 1000 MG tablet TAKE 1 TABLET TWICE A DAY WITH MEALS  180 tablet  2  . ranitidine (ZANTAC) 150 MG tablet TAKE 1 TABLET DAILY  90 tablet  3  . simvastatin (ZOCOR) 10 MG tablet Take 1 tablet (10 mg total) by mouth at bedtime.  90 tablet  1  . travoprost, benzalkonium, (TRAVATAN) 0.004 % ophthalmic solution 1 drop at bedtime.        . valACYclovir (VALTREX) 1000 MG tablet TAKE 1 TABLET DAILY  90 tablet  1  . vardenafil (LEVITRA) 20 MG tablet Take 1 tablet (20 mg total) by mouth daily as needed.  30 tablet  1   No current facility-administered medications on file prior to visit.    No Known Allergies  Review of Systems  Review of Systems  Constitutional: Negative for fever and malaise/fatigue.  HENT: Negative for congestion.   Eyes: Negative for discharge.  Respiratory: Negative for shortness of breath.   Cardiovascular: Negative for chest pain, palpitations and leg swelling.  Gastrointestinal: Negative for nausea, abdominal pain and diarrhea.  Genitourinary: Positive for frequency. Negative for dysuria, hematuria and flank pain.  Musculoskeletal: Negative  for falls.  Skin: Negative for rash.  Neurological: Negative for loss of consciousness and headaches.  Endo/Heme/Allergies: Negative for polydipsia.  Psychiatric/Behavioral: Negative for depression and suicidal ideas. The patient is not nervous/anxious and does not have insomnia.     Objective  BP 102/55  Pulse 58  Temp(Src) 98.1 F (36.7 C) (Oral)  Ht 6' 0.5" (1.842 m)  Wt 171 lb 0.6 oz (77.583 kg)  BMI 22.87 kg/m2  SpO2 97%  Physical Exam  Physical Exam  Constitutional: He is oriented to person, place, and time and well-developed, well-nourished, and in no distress. No distress.  HENT:  Head: Normocephalic and atraumatic.  Eyes: Conjunctivae are normal.  Neck: Neck supple. No thyromegaly present.  Cardiovascular: Normal rate, regular rhythm and  normal heart sounds.   No murmur heard. Pulmonary/Chest: Effort normal and breath sounds normal. No respiratory distress.  Abdominal: He exhibits no distension and no mass. There is no tenderness.  Musculoskeletal: He exhibits no edema.  Neurological: He is alert and oriented to person, place, and time.  Skin: Skin is warm.  Psychiatric: Memory, affect and judgment normal.    Lab Results  Component Value Date   TSH 1.111 07/28/2013   Lab Results  Component Value Date   WBC 4.8 07/28/2013   HGB 14.4 07/28/2013   HCT 42.1 07/28/2013   MCV 85.6 07/28/2013   PLT 203 07/28/2013   Lab Results  Component Value Date   CREATININE 0.98 07/28/2013   BUN 16 07/28/2013   NA 139 07/28/2013   K 4.6 07/28/2013   CL 103 07/28/2013   CO2 27 07/28/2013   Lab Results  Component Value Date   ALT 15 07/28/2013   AST 13 07/28/2013   ALKPHOS 54 07/28/2013   BILITOT 0.5 07/28/2013   Lab Results  Component Value Date   CHOL 150 07/28/2013   Lab Results  Component Value Date   HDL 54 07/28/2013   Lab Results  Component Value Date   LDLCALC 80 07/28/2013   Lab Results  Component Value Date   TRIG 80 07/28/2013   Lab Results  Component Value Date   CHOLHDL 2.8 07/28/2013     Assessment & Plan  DIABETES MELLITUS, TYPE II Continue current meds and avoid simple carbs, increase exercise  GLAUCOMA Patient unaware if he has open or closed angle, will request records from Opthamology  HYPERLIPIDEMIA Tolerating Zocor, avoid trans fats  BPH (benign prostatic hyperplasia) UA and PSA unremarkable, start Flomax

## 2013-08-04 NOTE — Patient Instructions (Signed)

## 2013-08-05 ENCOUNTER — Telehealth: Payer: Self-pay

## 2013-08-05 LAB — URINALYSIS
Bilirubin Urine: NEGATIVE
HGB URINE DIPSTICK: NEGATIVE
Ketones, ur: NEGATIVE mg/dL
Leukocytes, UA: NEGATIVE
Nitrite: NEGATIVE
Protein, ur: NEGATIVE mg/dL
Specific Gravity, Urine: 1.028 (ref 1.005–1.030)
UROBILINOGEN UA: 0.2 mg/dL (ref 0.0–1.0)
pH: 6 (ref 5.0–8.0)

## 2013-08-05 LAB — URINE CULTURE
Colony Count: NO GROWTH
Organism ID, Bacteria: NO GROWTH

## 2013-08-05 LAB — PSA, MEDICARE: PSA: 2.35 ng/mL (ref <=4.00)

## 2013-08-05 NOTE — Telephone Encounter (Signed)
Relevant patient education assigned to patient using Emmi. ° °

## 2013-08-07 ENCOUNTER — Encounter: Payer: Self-pay | Admitting: Family Medicine

## 2013-08-07 DIAGNOSIS — N400 Enlarged prostate without lower urinary tract symptoms: Secondary | ICD-10-CM | POA: Insufficient documentation

## 2013-08-07 NOTE — Assessment & Plan Note (Signed)
Continue current meds and avoid simple carbs, increase exercise

## 2013-08-07 NOTE — Assessment & Plan Note (Signed)
Tolerating Zocor, avoid trans fats

## 2013-08-07 NOTE — Assessment & Plan Note (Signed)
Patient unaware if he has open or closed angle, will request records from Opthamology

## 2013-08-07 NOTE — Assessment & Plan Note (Signed)
UA and PSA unremarkable, start Flomax

## 2013-08-08 ENCOUNTER — Telehealth: Payer: Self-pay | Admitting: Family Medicine

## 2013-08-08 NOTE — Telephone Encounter (Signed)
Received medical records from [facility]

## 2013-09-12 ENCOUNTER — Ambulatory Visit (INDEPENDENT_AMBULATORY_CARE_PROVIDER_SITE_OTHER): Payer: Medicare Other | Admitting: Family Medicine

## 2013-09-12 ENCOUNTER — Telehealth: Payer: Self-pay | Admitting: Family Medicine

## 2013-09-12 ENCOUNTER — Encounter: Payer: Self-pay | Admitting: Family Medicine

## 2013-09-12 VITALS — BP 112/64 | HR 63 | Temp 97.8°F | Ht 72.5 in | Wt 172.0 lb

## 2013-09-12 DIAGNOSIS — J329 Chronic sinusitis, unspecified: Secondary | ICD-10-CM

## 2013-09-12 DIAGNOSIS — E119 Type 2 diabetes mellitus without complications: Secondary | ICD-10-CM

## 2013-09-12 MED ORDER — BENZONATATE 200 MG PO CAPS: 200.0000 mg | ORAL_CAPSULE | Freq: Two times a day (BID) | ORAL | Status: DC | PRN

## 2013-09-12 MED ORDER — GUAIFENESIN ER 600 MG PO TB12: 600.0000 mg | ORAL_TABLET | Freq: Two times a day (BID) | ORAL | Status: DC

## 2013-09-12 MED ORDER — CEFDINIR 300 MG PO CAPS
300.0000 mg | ORAL_CAPSULE | Freq: Two times a day (BID) | ORAL | Status: AC
Start: 2013-09-12 — End: 2013-09-22

## 2013-09-12 MED ORDER — CEFDINIR 300 MG PO CAPS: 300.0000 mg | ORAL_CAPSULE | Freq: Two times a day (BID) | ORAL | Status: DC

## 2013-09-12 NOTE — Telephone Encounter (Signed)
Please call and inform pt that these were sent to [facility]. Dr Charlett Blake accidentally sent them to [facility]. I called and cancelled RX's. I spoke to [facility]

## 2013-09-12 NOTE — Telephone Encounter (Signed)
Left message informing patient of medication refill to [facility] on [address]

## 2013-09-12 NOTE — Patient Instructions (Signed)
[employer] probiotics daily   Sinusitis Sinusitis is redness, soreness, and swelling (inflammation) of the paranasal sinuses. Paranasal sinuses are air pockets within the bones of your face (beneath the eyes, the middle of the forehead, or above the eyes). In healthy paranasal sinuses, mucus is able to drain out, and air is able to circulate through them by way of your nose. However, when your paranasal sinuses are inflamed, mucus and air can become trapped. This can allow bacteria and other germs to grow and cause infection. Sinusitis can develop quickly and last only a short time (acute) or continue over a long period (chronic). Sinusitis that lasts for more than 12 weeks is considered chronic.  CAUSES  Causes of sinusitis include:  Allergies.  Structural abnormalities, such as displacement of the cartilage that separates your nostrils (deviated septum), which can decrease the air flow through your nose and sinuses and affect sinus drainage.  Functional abnormalities, such as when the small hairs (cilia) that line your sinuses and help remove mucus do not work properly or are not present. SYMPTOMS  Symptoms of acute and chronic sinusitis are the same. The primary symptoms are pain and pressure around the affected sinuses. Other symptoms include:  Upper toothache.  Earache.  Headache.  Bad breath.  Decreased sense of smell and taste.  A cough, which worsens when you are lying flat.  Fatigue.  Fever.  Thick drainage from your nose, which often is green and may contain pus (purulent).  Swelling and warmth over the affected sinuses. DIAGNOSIS  Your caregiver will perform a physical exam. During the exam, your caregiver may:  Look in your nose for signs of abnormal growths in your nostrils (nasal polyps).  Tap over the affected sinus to check for signs of infection.  View the inside of your sinuses (endoscopy) with a special imaging device with a light attached  (endoscope), which is inserted into your sinuses. If your caregiver suspects that you have chronic sinusitis, one or more of the following tests may be recommended:  Allergy tests.  Nasal culture A sample of mucus is taken from your nose and sent to a lab and screened for bacteria.  Nasal cytology A sample of mucus is taken from your nose and examined by your caregiver to determine if your sinusitis is related to an allergy. TREATMENT  Most cases of acute sinusitis are related to a viral infection and will resolve on their own within 10 days. Sometimes medicines are prescribed to help relieve symptoms (pain medicine, decongestants, nasal steroid sprays, or saline sprays).  However, for sinusitis related to a bacterial infection, your caregiver will prescribe antibiotic medicines. These are medicines that will help kill the bacteria causing the infection.  Rarely, sinusitis is caused by a fungal infection. In theses cases, your caregiver will prescribe antifungal medicine. For some cases of chronic sinusitis, surgery is needed. Generally, these are cases in which sinusitis recurs more than 3 times per year, despite other treatments. HOME CARE INSTRUCTIONS   Drink plenty of water. Water helps thin the mucus so your sinuses can drain more easily.  Use a humidifier.  Inhale steam 3 to 4 times a day (for example, sit in the bathroom with the shower running).  Apply a warm, moist washcloth to your face 3 to 4 times a day, or as directed by your caregiver.  Use saline nasal sprays to help moisten and clean your sinuses.  Take over-the-counter or prescription medicines for pain, discomfort, or fever only as directed  by your caregiver. SEEK IMMEDIATE MEDICAL CARE IF:  You have increasing pain or severe headaches.  You have nausea, vomiting, or drowsiness.  You have swelling around your face.  You have vision problems.  You have a stiff neck.  You have difficulty breathing. MAKE SURE  YOU:   Understand these instructions.  Will watch your condition.  Will get help right away if you are not doing well or get worse. Document Released: 06/23/2005 Document Revised: 09/15/2011 Document Reviewed: 07/08/2011 [facility] Patient Information 2014 [place], [place].

## 2013-09-12 NOTE — Assessment & Plan Note (Signed)
Has been forgetting to take his mid day dose of Metformin. Numbers up slightly. Minimize simple carbs

## 2013-09-12 NOTE — Telephone Encounter (Signed)
[name] with [facility] stated that he can't cancel this because they haven't got the RX yet but if patient fills this somewhere else it will stop them from being able to fill it.

## 2013-09-12 NOTE — Telephone Encounter (Signed)
Patient states that medication was supposed to be sent to [facility] on [address] from todays visit. He states that the pharmacy still does not have those medications and patient would like a call back regarding this.

## 2013-09-12 NOTE — Assessment & Plan Note (Signed)
Cefdinir, mucinex, tessalon and increase hydration and start probiotics

## 2013-09-12 NOTE — Progress Notes (Signed)
Pre visit review using our clinic review tool, if applicable. No additional management support is needed unless otherwise documented below in the visit note. 

## 2013-09-12 NOTE — Progress Notes (Signed)
Patient ID: [name], male   DOB: [birth year], 79 y.o.   MRN: [id number] [name] [id number] 12-Nov-1934 09/12/2013      Progress Note-Follow Up  Subjective  Chief Complaint  Chief Complaint  Patient presents with  . Sinusitis    head and chest congestion X 3 days, cough X 1 day w/yellow phlegm- no sinus headache    HPI  Patient is a 79 year old caucasian male in today for evaluation of worsening sinus congestion. He travels via air last week. When he left a week ago had minor nasal congestion but when he came home he had significantly more. Atrovent nasal helps somewhat but over the last several days she's had increased congestion and yellow rhinorrhea. He's had increasing postnasal drip sore throat and cough. Cough has been awakening him. He denies fevers, chills, ear pain or significant headache. Denies myalgias, chest pain, palpitations or shortness of breath. No GI or GU complaints at this time.  Past Medical History  Diagnosis Date  . Diabetes mellitus 2000    type 2  . Hyperlipidemia   . GERD (gastroesophageal reflux disease)   . Glaucoma     followed by Dr Lanell Matar at [facility]  . BPH (benign prostatic hypertrophy)   . Vasomotor rhinitis 05/02/2013    No past surgical history on file.  Family History  Problem Relation Age of Onset  . Cancer Sister     breast    History   Social History  . Marital Status: Married    Spouse Name: Stanton Kidney    Number of Children: 0  . Years of Education: N/A   Occupational History  . semi retired Education administrator  .     Social History Main Topics  . Smoking status: Never Smoker   . Smokeless tobacco: Never Used  . Alcohol Use: Yes  . Drug Use: Not on file  . Sexual Activity: Not on file   Other Topics Concern  . Not on file   Social History Narrative  . No narrative on file    Current Outpatient Prescriptions on File Prior to Visit  Medication Sig Dispense Refill  . brimonidine-timolol (COMBIGAN) 0.2-0.5 %  ophthalmic solution Place 1 drop into both eyes 2 (two) times daily.        . brinzolamide (AZOPT) 1 % ophthalmic suspension Place 1 drop into both eyes 3 (three) times daily.        Marland Kitchen ipratropium (ATROVENT) 0.03 % nasal spray Place 2 sprays into the nose every 12 (twelve) hours.  90 mL  1  . JANUVIA 100 MG tablet TAKE 1 TABLET DAILY  90 tablet  1  . metFORMIN (GLUCOPHAGE) 1000 MG tablet 1 tab po bid and 1/2 tab po q noon  225 tablet  2  . ranitidine (ZANTAC) 150 MG tablet TAKE 1 TABLET DAILY  90 tablet  3  . simvastatin (ZOCOR) 10 MG tablet Take 1 tablet (10 mg total) by mouth at bedtime.  90 tablet  1  . travoprost, benzalkonium, (TRAVATAN) 0.004 % ophthalmic solution 1 drop at bedtime.        . valACYclovir (VALTREX) 1000 MG tablet TAKE 1 TABLET DAILY  90 tablet  1  . vardenafil (LEVITRA) 20 MG tablet Take 1 tablet (20 mg total) by mouth daily as needed.  30 tablet  1   No current facility-administered medications on file prior to visit.    No Known Allergies  Review of Systems  Review of Systems  Constitutional: Negative  for fever and malaise/fatigue.  HENT: Negative for congestion.   Eyes: Negative for discharge.  Respiratory: Negative for shortness of breath.   Cardiovascular: Negative for chest pain, palpitations and leg swelling.  Gastrointestinal: Negative for nausea, abdominal pain and diarrhea.  Genitourinary: Negative for dysuria.  Musculoskeletal: Negative for falls.  Skin: Negative for rash.  Neurological: Negative for loss of consciousness and headaches.  Endo/Heme/Allergies: Negative for polydipsia.  Psychiatric/Behavioral: Negative for depression and suicidal ideas. The patient is not nervous/anxious and does not have insomnia.     Objective  BP 112/64  Pulse 63  Temp(Src) 97.8 F (36.6 C) (Oral)  Ht 6' 0.5" (1.842 m)  Wt 172 lb (78.019 kg)  BMI 22.99 kg/m2  SpO2 97%  Physical Exam  Physical Exam  Constitutional: He is oriented to person, place, and  time and well-developed, well-nourished, and in no distress. No distress.  HENT:  Head: Normocephalic and atraumatic.  Eyes: Conjunctivae are normal.  Neck: Neck supple. No thyromegaly present.  Cardiovascular: Normal rate, regular rhythm and normal heart sounds.   No murmur heard. Pulmonary/Chest: Effort normal and breath sounds normal. No respiratory distress.  Abdominal: He exhibits no distension and no mass. There is no tenderness.  Musculoskeletal: He exhibits no edema.  Neurological: He is alert and oriented to person, place, and time.  Skin: Skin is warm.  Psychiatric: Memory, affect and judgment normal.    Lab Results  Component Value Date   TSH 1.111 07/28/2013   Lab Results  Component Value Date   WBC 4.8 07/28/2013   HGB 14.4 07/28/2013   HCT 42.1 07/28/2013   MCV 85.6 07/28/2013   PLT 203 07/28/2013   Lab Results  Component Value Date   CREATININE 0.98 07/28/2013   BUN 16 07/28/2013   NA 139 07/28/2013   K 4.6 07/28/2013   CL 103 07/28/2013   CO2 27 07/28/2013   Lab Results  Component Value Date   ALT 15 07/28/2013   AST 13 07/28/2013   ALKPHOS 54 07/28/2013   BILITOT 0.5 07/28/2013   Lab Results  Component Value Date   CHOL 150 07/28/2013   Lab Results  Component Value Date   HDL 54 07/28/2013   Lab Results  Component Value Date   LDLCALC 80 07/28/2013   Lab Results  Component Value Date   TRIG 80 07/28/2013   Lab Results  Component Value Date   CHOLHDL 2.8 07/28/2013     Assessment & Plan  Sinusitis Cefdinir, mucinex, tessalon and increase hydration and start probiotics  DIABETES MELLITUS, TYPE II Has been forgetting to take his mid day dose of Metformin. Numbers up slightly. Minimize simple carbs

## 2013-09-26 ENCOUNTER — Other Ambulatory Visit: Payer: Self-pay | Admitting: Internal Medicine

## 2013-09-30 ENCOUNTER — Telehealth: Payer: Self-pay | Admitting: Family Medicine

## 2013-09-30 DIAGNOSIS — J329 Chronic sinusitis, unspecified: Secondary | ICD-10-CM

## 2013-09-30 NOTE — Telephone Encounter (Signed)
Patient called in stating that a 3 month supply of the nasal spray should have been called in. He states that only one month supply was called into [facility]

## 2013-09-30 NOTE — Telephone Encounter (Signed)
Patient was informed that 90 ml was sent with 1 refill and a normal size bottle is 30 ml. Pt states 30 ml is what was sent to him. I informed patient that he would need to contact them. Pt voiced understanding and stated he just wanted to check with [place] first.

## 2013-11-04 ENCOUNTER — Other Ambulatory Visit: Payer: Self-pay | Admitting: Family Medicine

## 2013-11-21 ENCOUNTER — Encounter: Payer: Self-pay | Admitting: Family Medicine

## 2013-11-21 ENCOUNTER — Ambulatory Visit (INDEPENDENT_AMBULATORY_CARE_PROVIDER_SITE_OTHER): Payer: Medicare Other | Admitting: Family Medicine

## 2013-11-21 VITALS — BP 114/58 | HR 58 | Temp 97.8°F | Ht 72.5 in | Wt 167.1 lb

## 2013-11-21 DIAGNOSIS — H9190 Unspecified hearing loss, unspecified ear: Secondary | ICD-10-CM

## 2013-11-21 DIAGNOSIS — E785 Hyperlipidemia, unspecified: Secondary | ICD-10-CM

## 2013-11-21 DIAGNOSIS — E119 Type 2 diabetes mellitus without complications: Secondary | ICD-10-CM

## 2013-11-21 DIAGNOSIS — K219 Gastro-esophageal reflux disease without esophagitis: Secondary | ICD-10-CM

## 2013-11-21 LAB — RENAL FUNCTION PANEL
Albumin: 4.3 g/dL (ref 3.5–5.2)
BUN: 19 mg/dL (ref 6–23)
CALCIUM: 9.4 mg/dL (ref 8.4–10.5)
CO2: 26 mEq/L (ref 19–32)
CREATININE: 1 mg/dL (ref 0.50–1.35)
Chloride: 104 mEq/L (ref 96–112)
Glucose, Bld: 136 mg/dL — ABNORMAL HIGH (ref 70–99)
PHOSPHORUS: 3.6 mg/dL (ref 2.3–4.6)
POTASSIUM: 4.2 meq/L (ref 3.5–5.3)
Sodium: 140 mEq/L (ref 135–145)

## 2013-11-21 LAB — LIPID PANEL
Cholesterol: 148 mg/dL (ref 0–200)
HDL: 56 mg/dL (ref >39)
LDL Cholesterol: 78 mg/dL (ref 0–99)
Total CHOL/HDL Ratio: 2.6 Ratio
Triglycerides: 71 mg/dL (ref <150)
VLDL: 14 mg/dL (ref 0–40)

## 2013-11-21 LAB — HEPATIC FUNCTION PANEL
ALT: 14 U/L (ref 0–53)
AST: 14 U/L (ref 0–37)
Albumin: 4.3 g/dL (ref 3.5–5.2)
Alkaline Phosphatase: 56 U/L (ref 39–117)
BILIRUBIN INDIRECT: 0.5 mg/dL (ref 0.2–1.2)
Bilirubin, Direct: 0.1 mg/dL (ref 0.0–0.3)
Total Bilirubin: 0.6 mg/dL (ref 0.2–1.2)
Total Protein: 6.4 g/dL (ref 6.0–8.3)

## 2013-11-21 LAB — CBC
HEMATOCRIT: 41.1 % (ref 39.0–52.0)
Hemoglobin: 14.2 g/dL (ref 13.0–17.0)
MCH: 29.3 pg (ref 26.0–34.0)
MCHC: 34.5 g/dL (ref 30.0–36.0)
MCV: 84.9 fL (ref 78.0–100.0)
PLATELETS: 224 10*3/uL (ref 150–400)
RBC: 4.84 MIL/uL (ref 4.22–5.81)
RDW: 14.1 % (ref 11.5–15.5)
WBC: 6.9 10*3/uL (ref 4.0–10.5)

## 2013-11-21 LAB — HEMOGLOBIN A1C
Hgb A1c MFr Bld: 7.1 % — ABNORMAL HIGH (ref <5.7)
Mean Plasma Glucose: 157 mg/dL — ABNORMAL HIGH (ref <117)

## 2013-11-21 LAB — TSH: TSH: 1.126 u[IU]/mL (ref 0.350–4.500)

## 2013-11-21 NOTE — Patient Instructions (Signed)
Ringworm, Nail A fungal infection of the nail (tinea unguium/onychomycosis) is common. It is common as the visible part of the nail is composed of dead cells which have no blood supply to help prevent infection. It occurs because fungi are everywhere and will pick any opportunity to grow on any dead material. Because nails are very slow growing they require up to 2 years of treatment with anti-fungal medications. The entire nail back to the base is infected. This includes approximately  of the nail which you cannot see. If your caregiver has prescribed a medication by mouth, take it every day and as directed. No progress will be seen for at least 6 to 9 months. Do not be disappointed! Because fungi live on dead cells with little or no exposure to blood supply, medication delivery to the infection is slow; thus the cure is slow. It is also why you can observe no progress in the first 6 months. The nail becoming cured is the base of the nail, as it has the blood supply. Topical medication such as creams and ointments are usually not effective. Important in successful treatment of nail fungus is closely following the medication regimen that your doctor prescribes. Sometimes you and your caregiver may elect to speed up this process by surgical removal of all the nails. Even this may still require 6 to 9 months of additional oral medications. See your caregiver as directed. Remember there will be no visible improvement for at least 6 months. See your caregiver sooner if other signs of infection (redness and swelling) develop. Document Released: 06/20/2000 Document Revised: 09/15/2011 Document Reviewed: 08/29/2008 [employer] Patient Information 2014 [employer].  

## 2013-11-21 NOTE — Progress Notes (Signed)
Pre visit review using our clinic review tool, if applicable. No additional management support is needed unless otherwise documented below in the visit note. 

## 2013-11-23 ENCOUNTER — Encounter: Payer: Self-pay | Admitting: Family Medicine

## 2013-11-23 DIAGNOSIS — H9190 Unspecified hearing loss, unspecified ear: Secondary | ICD-10-CM | POA: Insufficient documentation

## 2013-11-23 NOTE — Assessment & Plan Note (Signed)
Avoid offending foods, start probiotics. Do not eat large meals in late evening and consider raising head of bed.  

## 2013-11-23 NOTE — Assessment & Plan Note (Signed)
Tolerating statin, encouraged heart healthy diet, avoid trans fats, minimize simple carbs and saturated fats. Increase exercise as tolerated 

## 2013-11-23 NOTE — Assessment & Plan Note (Signed)
hgba1c acceptable, minimize simple carbs. Increase exercise as tolerated. Continue current meds 

## 2013-11-23 NOTE — Assessment & Plan Note (Signed)
Has had hearing aides in past, needs new eval. Referred to audiology

## 2013-11-23 NOTE — Progress Notes (Signed)
Patient ID: [name], male   DOB: August 20, 1934, 79 y.o.   MRN: [id number] ASHTYN FREILICH [id number] [birth year] 11/23/2013      Progress Note-Follow Up  Subjective  Chief Complaint  Chief Complaint  Patient presents with  . Follow-up    3 month    HPI  Patient is a 79 year old male in today for routine medical care. He is generally doing well. Has been struggling with persistent hearing and vision loss but no striking recent change. No other acute complaints. No recent illness. Denies CP/palp/SOB/HA/congestion/fevers/GI or GU c/o. Taking meds as prescribed  Past Medical History  Diagnosis Date  . Diabetes mellitus 2000    type 2  . Hyperlipidemia   . GERD (gastroesophageal reflux disease)   . Glaucoma     followed by Dr Lanell Matar at [facility]  . BPH (benign prostatic hypertrophy)   . Vasomotor rhinitis 05/02/2013    Past Surgical History  Procedure Laterality Date  . Appendectomy  1943    Family History  Problem Relation Age of Onset  . Cancer Sister     breast  . Heart disease Father     MI  . Diabetes Father   . Arthritis Brother     knees  . Heart disease Brother     a fib    History   Social History  . Marital Status: Married    Spouse Name: Stanton Kidney    Number of Children: 0  . Years of Education: N/A   Occupational History  . semi retired Education administrator  .     Social History Main Topics  . Smoking status: Never Smoker   . Smokeless tobacco: Never Used  . Alcohol Use: Yes  . Drug Use: Not on file  . Sexual Activity: Yes     Comment: lives with wife, travels frequently, no major dietary restrictions. exercises regularly with aerobic, stretch and weights   Other Topics Concern  . Not on file   Social History Narrative  . No narrative on file    Current Outpatient Prescriptions on File Prior to Visit  Medication Sig Dispense Refill  . brimonidine-timolol (COMBIGAN) 0.2-0.5 % ophthalmic solution Place 1 drop into both eyes 2 (two) times  daily.        . brinzolamide (AZOPT) 1 % ophthalmic suspension Place 1 drop into both eyes 3 (three) times daily.        Marland Kitchen ipratropium (ATROVENT) 0.03 % nasal spray PLACE 2 SPRAYS INTO THE NOSE EVERY 12 HOURS  90 mL  1  . JANUVIA 100 MG tablet TAKE 1 TABLET DAILY  90 tablet  1  . metFORMIN (GLUCOPHAGE) 1000 MG tablet 1 tab po bid and 1/2 tab po q noon  225 tablet  2  . ranitidine (ZANTAC) 150 MG tablet TAKE 1 TABLET DAILY  90 tablet  3  . simvastatin (ZOCOR) 10 MG tablet Take 1 tablet (10 mg total) by mouth at bedtime.  90 tablet  1  . travoprost, benzalkonium, (TRAVATAN) 0.004 % ophthalmic solution 1 drop at bedtime.        . valACYclovir (VALTREX) 1000 MG tablet TAKE 1 TABLET DAILY  90 tablet  0  . vardenafil (LEVITRA) 20 MG tablet Take 1 tablet (20 mg total) by mouth daily as needed.  30 tablet  1   No current facility-administered medications on file prior to visit.    No Known Allergies  Review of Systems  Review of Systems  Constitutional: Negative for fever and  malaise/fatigue.  HENT: Positive for hearing loss. Negative for congestion.   Eyes: Negative for discharge.  Respiratory: Negative for shortness of breath.   Cardiovascular: Negative for chest pain, palpitations and leg swelling.  Gastrointestinal: Negative for nausea, abdominal pain and diarrhea.  Genitourinary: Negative for dysuria.  Musculoskeletal: Negative for falls.  Skin: Negative for rash.  Neurological: Negative for loss of consciousness and headaches.  Endo/Heme/Allergies: Negative for polydipsia.  Psychiatric/Behavioral: Negative for depression and suicidal ideas. The patient is not nervous/anxious and does not have insomnia.     Objective  BP 114/58  Pulse 58  Temp(Src) 97.8 F (36.6 C) (Oral)  Ht 6' 0.5" (1.842 m)  Wt 167 lb 1.3 oz (75.787 kg)  BMI 22.34 kg/m2  SpO2 98%  Physical Exam  Physical Exam  Constitutional: He is oriented to person, place, and time and well-developed, well-nourished,  and in no distress. No distress.  HENT:  Head: Normocephalic and atraumatic.  Eyes: Conjunctivae are normal.  Neck: Neck supple. No thyromegaly present.  Cardiovascular: Normal rate, regular rhythm and normal heart sounds.   No murmur heard. Pulmonary/Chest: Effort normal and breath sounds normal. No respiratory distress.  Abdominal: He exhibits no distension and no mass. There is no tenderness.  Musculoskeletal: He exhibits no edema.  Neurological: He is alert and oriented to person, place, and time.  Skin: Skin is warm.  Psychiatric: Memory, affect and judgment normal.    Lab Results  Component Value Date   TSH 1.126 11/21/2013   Lab Results  Component Value Date   WBC 6.9 11/21/2013   HGB 14.2 11/21/2013   HCT 41.1 11/21/2013   MCV 84.9 11/21/2013   PLT 224 11/21/2013   Lab Results  Component Value Date   CREATININE 1.00 11/21/2013   BUN 19 11/21/2013   NA 140 11/21/2013   K 4.2 11/21/2013   CL 104 11/21/2013   CO2 26 11/21/2013   Lab Results  Component Value Date   ALT 14 11/21/2013   AST 14 11/21/2013   ALKPHOS 56 11/21/2013   BILITOT 0.6 11/21/2013   Lab Results  Component Value Date   CHOL 148 11/21/2013   Lab Results  Component Value Date   HDL 56 11/21/2013   Lab Results  Component Value Date   LDLCALC 78 11/21/2013   Lab Results  Component Value Date   TRIG 71 11/21/2013   Lab Results  Component Value Date   CHOLHDL 2.6 11/21/2013     Assessment & Plan  Hearing loss Has had hearing aides in past, needs new eval. Referred to audiology  GERD Avoid offending foods, start probiotics. Do not eat large meals in late evening and consider raising head of bed.   HYPERLIPIDEMIA Tolerating statin, encouraged heart healthy diet, avoid trans fats, minimize simple carbs and saturated fats. Increase exercise as tolerated  DIABETES MELLITUS, TYPE II hgba1c acceptable, minimize simple carbs. Increase exercise as tolerated. Continue current meds

## 2013-12-08 ENCOUNTER — Other Ambulatory Visit: Payer: Self-pay | Admitting: Family Medicine

## 2014-01-26 ENCOUNTER — Other Ambulatory Visit: Payer: Self-pay | Admitting: Family Medicine

## 2014-03-01 ENCOUNTER — Other Ambulatory Visit: Payer: Self-pay | Admitting: Family Medicine

## 2014-03-09 ENCOUNTER — Other Ambulatory Visit: Payer: Self-pay | Admitting: Family Medicine

## 2014-04-04 ENCOUNTER — Encounter: Payer: Self-pay | Admitting: Gastroenterology

## 2014-04-12 ENCOUNTER — Telehealth: Payer: Self-pay | Admitting: Family Medicine

## 2014-04-12 DIAGNOSIS — E119 Type 2 diabetes mellitus without complications: Secondary | ICD-10-CM

## 2014-04-12 DIAGNOSIS — E785 Hyperlipidemia, unspecified: Secondary | ICD-10-CM

## 2014-04-12 NOTE — Telephone Encounter (Signed)
Future lab orders placed/SLS

## 2014-04-12 NOTE — Telephone Encounter (Signed)
LAB ORDER WEEK OF 11-9 FOR [place] SW lipid, renal, cbc, tsh, hepatic, hgba1c prior.

## 2014-05-15 ENCOUNTER — Ambulatory Visit: Payer: Medicare Other | Admitting: Family Medicine

## 2014-05-17 ENCOUNTER — Other Ambulatory Visit (INDEPENDENT_AMBULATORY_CARE_PROVIDER_SITE_OTHER): Payer: Medicare Other

## 2014-05-17 DIAGNOSIS — E119 Type 2 diabetes mellitus without complications: Secondary | ICD-10-CM

## 2014-05-17 DIAGNOSIS — E785 Hyperlipidemia, unspecified: Secondary | ICD-10-CM

## 2014-05-17 LAB — HEPATIC FUNCTION PANEL
ALT: 12 U/L (ref 0–53)
AST: 12 U/L (ref 0–37)
Albumin: 3.3 g/dL — ABNORMAL LOW (ref 3.5–5.2)
Alkaline Phosphatase: 48 U/L (ref 39–117)
BILIRUBIN DIRECT: 0.1 mg/dL (ref 0.0–0.3)
Total Bilirubin: 0.6 mg/dL (ref 0.2–1.2)
Total Protein: 6.4 g/dL (ref 6.0–8.3)

## 2014-05-17 LAB — CBC WITH DIFFERENTIAL/PLATELET
Basophils Absolute: 0 10*3/uL (ref 0.0–0.1)
Basophils Relative: 0.3 % (ref 0.0–3.0)
EOS PCT: 5 % (ref 0.0–5.0)
Eosinophils Absolute: 0.4 10*3/uL (ref 0.0–0.7)
HEMATOCRIT: 41.4 % (ref 39.0–52.0)
Hemoglobin: 13.9 g/dL (ref 13.0–17.0)
LYMPHS ABS: 1.6 10*3/uL (ref 0.7–4.0)
Lymphocytes Relative: 20 % (ref 12.0–46.0)
MCHC: 33.5 g/dL (ref 30.0–36.0)
MCV: 87.7 fl (ref 78.0–100.0)
MONO ABS: 0.9 10*3/uL (ref 0.1–1.0)
Monocytes Relative: 11 % (ref 3.0–12.0)
Neutro Abs: 4.9 10*3/uL (ref 1.4–7.7)
Neutrophils Relative %: 63.7 % (ref 43.0–77.0)
PLATELETS: 233 10*3/uL (ref 150.0–400.0)
RBC: 4.72 Mil/uL (ref 4.22–5.81)
RDW: 13.8 % (ref 11.5–15.5)
WBC: 7.8 10*3/uL (ref 4.0–10.5)

## 2014-05-17 LAB — LIPID PANEL
Cholesterol: 141 mg/dL (ref 0–200)
HDL: 46.8 mg/dL (ref >39.00)
LDL Cholesterol: 78 mg/dL (ref 0–99)
NONHDL: 94.2
Total CHOL/HDL Ratio: 3
Triglycerides: 79 mg/dL (ref 0.0–149.0)
VLDL: 15.8 mg/dL (ref 0.0–40.0)

## 2014-05-17 LAB — BASIC METABOLIC PANEL
BUN: 18 mg/dL (ref 6–23)
CALCIUM: 9.1 mg/dL (ref 8.4–10.5)
CO2: 27 mEq/L (ref 19–32)
CREATININE: 1.1 mg/dL (ref 0.4–1.5)
Chloride: 105 mEq/L (ref 96–112)
GFR: 70.41 mL/min (ref >60.00)
Glucose, Bld: 172 mg/dL — ABNORMAL HIGH (ref 70–99)
Potassium: 4.5 mEq/L (ref 3.5–5.1)
Sodium: 138 mEq/L (ref 135–145)

## 2014-05-17 LAB — HEMOGLOBIN A1C: Hgb A1c MFr Bld: 7 % — ABNORMAL HIGH (ref 4.6–6.5)

## 2014-05-17 LAB — TSH: TSH: 1.58 u[IU]/mL (ref 0.35–4.50)

## 2014-05-26 ENCOUNTER — Encounter: Payer: Self-pay | Admitting: Family Medicine

## 2014-05-26 ENCOUNTER — Ambulatory Visit (INDEPENDENT_AMBULATORY_CARE_PROVIDER_SITE_OTHER): Payer: Medicare Other | Admitting: Family Medicine

## 2014-05-26 VITALS — BP 118/48 | HR 53 | Temp 97.8°F | Ht 72.5 in | Wt 171.0 lb

## 2014-05-26 DIAGNOSIS — E1165 Type 2 diabetes mellitus with hyperglycemia: Secondary | ICD-10-CM

## 2014-05-26 DIAGNOSIS — K219 Gastro-esophageal reflux disease without esophagitis: Secondary | ICD-10-CM

## 2014-05-26 DIAGNOSIS — IMO0002 Reserved for concepts with insufficient information to code with codable children: Secondary | ICD-10-CM

## 2014-05-26 DIAGNOSIS — N400 Enlarged prostate without lower urinary tract symptoms: Secondary | ICD-10-CM

## 2014-05-26 DIAGNOSIS — E785 Hyperlipidemia, unspecified: Secondary | ICD-10-CM

## 2014-05-26 MED ORDER — DUTASTERIDE 0.5 MG PO CAPS: 0.5000 mg | ORAL_CAPSULE | Freq: Every day | ORAL | Status: DC

## 2014-05-26 NOTE — Progress Notes (Signed)
Pre visit review using our clinic review tool, if applicable. No additional management support is needed unless otherwise documented below in the visit note. 

## 2014-06-04 NOTE — Progress Notes (Signed)
[name]  [id number] 07/12/1934 06/04/2014      Progress Note-Follow Up  Subjective  Chief Complaint  Chief Complaint  Patient presents with  . Follow-up    HPI  Patient is a 79 y.o. male in today for routine medical care. Patient is in today noting good control of her blood sugars. A1c this morning was 115. Denies polyuria or polydipsia. Notes tamsulosin was helpful for his urinary frequency and urgency but unfortunately he had loose stool 1-2 hours after taking each dose. No other recent illness or concerns. No chest pain, palpitations or shortness of breath. Taking meds as prescribed  Past Medical History  Diagnosis Date  . Diabetes mellitus 2000    type 2  . Hyperlipidemia   . GERD (gastroesophageal reflux disease)   . Glaucoma     [facility] Dr [facility]ell [facility]  . BPH (benign prostatic hypertrophy)   . Vasomotor rhinitis 05/02/2013    Past Surgical History  Procedure Laterality Date  . Appendectomy  1943    Family History  Problem Relation Age of Onset  . Cancer Sister     breast  . Heart disease Father     MI  . Diabetes Father   . Arthritis Brother     knees  . Heart disease Brother     a fib    History   Social History  . Marital Status: Married    Spouse Name: Stanton Kidney    Number of Children: 0  . Years of Education: N/A   Occupational History  . semi retired Education administrator  .     Social History Main Topics  . Smoking status: Never Smoker   . Smokeless tobacco: Never Used  . Alcohol Use: Yes  . Drug Use: Not on file  . Sexual Activity: Yes     Comment: lives with wife, travels frequently, no major dietary restrictions. exercises regularly with aerobic, stretch and weights   Other Topics Concern  . Not on file   Social History Narrative    Current Outpatient Prescriptions on File Prior to Visit  Medication Sig Dispense Refill  . brimonidine-timolol (COMBIGAN) 0.2-0.5 % ophthalmic solution Place 1 drop into both eyes 2  (two) times daily.      . brinzolamide (AZOPT) 1 % ophthalmic suspension Place 1 drop into both eyes 3 (three) times daily.      Marland Kitchen ipratropium (ATROVENT) 0.03 % nasal spray PLACE 2 SPRAYS INTO THE NOSE EVERY 12 HOURS 90 mL 1  . JANUVIA 100 MG tablet TAKE 1 TABLET DAILY 90 tablet 1  . metFORMIN (GLUCOPHAGE) 1000 MG tablet 1 tab po bid and 1/2 tab po q noon 225 tablet 2  . ranitidine (ZANTAC) 150 MG tablet TAKE 1 TABLET DAILY 90 tablet 2  . simvastatin (ZOCOR) 10 MG tablet TAKE 1 TABLET AT BEDTIME 90 tablet 1  . travoprost, benzalkonium, (TRAVATAN) 0.004 % ophthalmic solution 1 drop at bedtime.      . valACYclovir (VALTREX) 1000 MG tablet TAKE 1 TABLET DAILY 90 tablet 0  . vardenafil (LEVITRA) 20 MG tablet Take 1 tablet (20 mg total) by mouth daily as needed. 30 tablet 1   No current facility-administered medications on file prior to visit.    No Known Allergies  Review of Systems  Review of Systems  Constitutional: Negative for fever and malaise/fatigue.  HENT: Negative for congestion.   Eyes: Negative for discharge.  Respiratory: Negative for shortness of breath.   Cardiovascular: Negative for chest pain,  palpitations and leg swelling.  Gastrointestinal: Negative for nausea, abdominal pain and diarrhea.  Genitourinary: Negative for dysuria.  Musculoskeletal: Negative for falls.  Skin: Negative for rash.  Neurological: Negative for loss of consciousness and headaches.  Endo/Heme/Allergies: Negative for polydipsia.  Psychiatric/Behavioral: Negative for depression and suicidal ideas. The patient is not nervous/anxious and does not have insomnia.     Objective  BP 118/48 mmHg  Pulse 53  Temp(Src) 97.8 F (36.6 C) (Oral)  Ht 6' 0.5" (1.842 m)  Wt 171 lb (77.565 kg)  BMI 22.86 kg/m2  SpO2 98%  Physical Exam  Physical Exam  Constitutional: He is oriented to person, place, and time and well-developed, well-nourished, and in no distress. No distress.  HENT:  Head:  Normocephalic and atraumatic.  Eyes: Conjunctivae are normal.  Neck: Neck supple. No thyromegaly present.  Cardiovascular: Normal rate, regular rhythm and normal heart sounds.   No murmur heard. Pulmonary/Chest: Effort normal and breath sounds normal. No respiratory distress.  Abdominal: He exhibits no distension and no mass. There is no tenderness.  Musculoskeletal: He exhibits no edema.  Neurological: He is alert and oriented to person, place, and time.  Skin: Skin is warm.  Psychiatric: Memory, affect and judgment normal.    Lab Results  Component Value Date   TSH 1.58 05/17/2014   Lab Results  Component Value Date   WBC 7.8 05/17/2014   HGB 13.9 05/17/2014   HCT 41.4 05/17/2014   MCV 87.7 05/17/2014   PLT 233.0 05/17/2014   Lab Results  Component Value Date   CREATININE 1.1 05/17/2014   BUN 18 05/17/2014   NA 138 05/17/2014   K 4.5 05/17/2014   CL 105 05/17/2014   CO2 27 05/17/2014   Lab Results  Component Value Date   ALT 12 05/17/2014   AST 12 05/17/2014   ALKPHOS 48 05/17/2014   BILITOT 0.6 05/17/2014   Lab Results  Component Value Date   CHOL 141 05/17/2014   Lab Results  Component Value Date   HDL 46.80 05/17/2014   Lab Results  Component Value Date   LDLCALC 78 05/17/2014   Lab Results  Component Value Date   TRIG 79.0 05/17/2014   Lab Results  Component Value Date   CHOLHDL 3 05/17/2014     Assessment & Plan  GERD Avoid offending foods, start probiotics. Do not eat large meals in late evening and consider raising head of bed.   Diabetes mellitus type 2, uncontrolled hgba1c acceptable, minimize simple carbs. Increase exercise as tolerated. Continue current meds  Hyperlipidemia Tolerating statin, encouraged heart healthy diet, avoid trans fats, minimize simple carbs and saturated fats. Increase exercise as tolerated  BPH (benign prostatic hyperplasia) Tamulosin helped his BPH symptoms but it gave him diarrhea after each dose. Will  try switch to Avodart.

## 2014-06-04 NOTE — Assessment & Plan Note (Signed)
Tolerating statin, encouraged heart healthy diet, avoid trans fats, minimize simple carbs and saturated fats. Increase exercise as tolerated 

## 2014-06-04 NOTE — Assessment & Plan Note (Signed)
Tamulosin helped his BPH symptoms but it gave him diarrhea after each dose. Will try switch to Avodart.

## 2014-06-04 NOTE — Assessment & Plan Note (Signed)
hgba1c acceptable, minimize simple carbs. Increase exercise as tolerated. Continue current meds 

## 2014-06-04 NOTE — Assessment & Plan Note (Signed)
Avoid offending foods, start probiotics. Do not eat large meals in late evening and consider raising head of bed.  

## 2014-07-19 ENCOUNTER — Telehealth: Payer: Self-pay | Admitting: Family Medicine

## 2014-07-19 NOTE — Telephone Encounter (Signed)
Caller name: Adell Relation to pt: self Call back number:  [phone] [facility]acy[facility]  Reason for call:   Please call patient. He has questions regarding medication for enlarged prostate. Requesting tamsulosin 90 day supply.

## 2014-07-20 NOTE — Telephone Encounter (Signed)
OK to send him in a 90 day supply of Tamulosin with 1 rf

## 2014-07-20 NOTE — Telephone Encounter (Signed)
Called and spoke with the pt and he stated that he was put on the Tamsulosin and it cause him to have diarrhea, so he was switched to Avodart.  Pt stated that the Avodart was worse it caused him not to empty his bladder and he was having to go to the bedroom more.  The pt said he stopped the Avodart and started himself back on the Tamsulosin,which he had 1 refill left at a locator pharmacy.  Pt said he has changed some things which has made it better to take the Tamsulosin.  Pt feels much better on the Tamsulosin and would like to continue on it.  He is requesting refill to [facility].  Please advise.//AB/CMA

## 2014-07-21 MED ORDER — TAMSULOSIN HCL 0.4 MG PO CAPS: 0.4000 mg | ORAL_CAPSULE | Freq: Every day | ORAL | Status: DC

## 2014-07-21 NOTE — Telephone Encounter (Signed)
Msg left advising Rx sent.      KP

## 2014-09-04 ENCOUNTER — Encounter: Payer: Self-pay | Admitting: Gastroenterology

## 2014-09-14 ENCOUNTER — Other Ambulatory Visit: Payer: Self-pay | Admitting: Family Medicine

## 2014-11-01 ENCOUNTER — Other Ambulatory Visit: Payer: Self-pay | Admitting: Family Medicine

## 2014-11-01 DIAGNOSIS — E118 Type 2 diabetes mellitus with unspecified complications: Secondary | ICD-10-CM

## 2014-11-02 ENCOUNTER — Telehealth: Payer: Self-pay | Admitting: Family Medicine

## 2014-11-02 MED ORDER — RANITIDINE HCL 150 MG PO TABS: 150.0000 mg | ORAL_TABLET | Freq: Every day | ORAL | Status: DC

## 2014-11-02 NOTE — Telephone Encounter (Signed)
Refill done.  

## 2014-11-02 NOTE — Telephone Encounter (Signed)
rantidine 150mg  takes 1 per day needs 90 day supply from [facility]    They failed to ask for this refill when they sent the other one

## 2014-11-07 ENCOUNTER — Encounter: Payer: Self-pay | Admitting: Gastroenterology

## 2014-11-10 ENCOUNTER — Ambulatory Visit (AMBULATORY_SURGERY_CENTER): Payer: Self-pay

## 2014-11-10 VITALS — Ht 72.0 in | Wt 172.0 lb

## 2014-11-10 DIAGNOSIS — Z860100 Personal history of colon polyps, unspecified: Secondary | ICD-10-CM

## 2014-11-10 MED ORDER — SUPREP BOWEL PREP KIT 17.5-3.13-1.6 GM/177ML PO SOLN: 1.0000 | Freq: Once | ORAL | Status: DC

## 2014-11-10 NOTE — Progress Notes (Signed)
No allergies to eggs or soy No diet/weight loss meds No home oxygen No past problems with anesthesia   

## 2014-11-23 ENCOUNTER — Ambulatory Visit (AMBULATORY_SURGERY_CENTER): Payer: Medicare Other | Admitting: Gastroenterology

## 2014-11-23 ENCOUNTER — Telehealth: Payer: Self-pay | Admitting: Family Medicine

## 2014-11-23 ENCOUNTER — Other Ambulatory Visit: Payer: Self-pay | Admitting: Gastroenterology

## 2014-11-23 ENCOUNTER — Encounter: Payer: Self-pay | Admitting: Gastroenterology

## 2014-11-23 VITALS — BP 123/79 | HR 56 | Temp 96.8°F | Resp 14 | Ht 72.0 in | Wt 172.0 lb

## 2014-11-23 DIAGNOSIS — D122 Benign neoplasm of ascending colon: Secondary | ICD-10-CM

## 2014-11-23 DIAGNOSIS — D129 Benign neoplasm of anus and anal canal: Secondary | ICD-10-CM

## 2014-11-23 DIAGNOSIS — D120 Benign neoplasm of cecum: Secondary | ICD-10-CM | POA: Diagnosis not present

## 2014-11-23 DIAGNOSIS — D128 Benign neoplasm of rectum: Secondary | ICD-10-CM

## 2014-11-23 DIAGNOSIS — D124 Benign neoplasm of descending colon: Secondary | ICD-10-CM

## 2014-11-23 DIAGNOSIS — K621 Rectal polyp: Secondary | ICD-10-CM | POA: Diagnosis not present

## 2014-11-23 DIAGNOSIS — Z86010 Personal history of colonic polyps: Secondary | ICD-10-CM

## 2014-11-23 DIAGNOSIS — E119 Type 2 diabetes mellitus without complications: Secondary | ICD-10-CM

## 2014-11-23 DIAGNOSIS — E782 Mixed hyperlipidemia: Secondary | ICD-10-CM

## 2014-11-23 MED ORDER — SODIUM CHLORIDE 0.9 % IV SOLN: 500.0000 mL | INTRAVENOUS | Status: DC

## 2014-11-23 NOTE — Op Note (Signed)
[facility]  [address] [place], [address]   COLONOSCOPY PROCEDURE REPORT  PATIENT: Mario, Gardner  MR#: [id number] BIRTHDATE: May 21, 1935 , 79  yrs. old GENDER: male ENDOSCOPIST: Ladene Artist, MD, [facility] PROCEDURE DATE:  11/23/2014 PROCEDURE:   Colonoscopy, surveillance , Colonoscopy with biopsy, and Colonoscopy with snare polypectomy First Screening Colonoscopy - Avg.  risk and is 50 yrs.  old or older - No.  Prior Negative Screening - Now for repeat screening. N/A  History of Adenoma - Now for follow-up colonoscopy & has been > or = to 3 yrs.  Yes hx of adenoma.  Has been 3 or more years since last colonoscopy.  Polyps removed today? Yes ASA CLASS:   Class II INDICATIONS:Surveillance due to prior colonic neoplasia and PH Colon Adenoma. MEDICATIONS: Monitored anesthesia care, Propofol 200 mg IV, and lidocaine 120 mg IV DESCRIPTION OF PROCEDURE:   After the risks benefits and alternatives of the procedure were thoroughly explained, informed consent was obtained.  The digital rectal exam revealed no abnormalities of the rectum.   The LB [id number]  endoscope was introduced through the anus and advanced to the cecum, which was identified by both the appendix and ileocecal valve. No adverse events experienced.   The quality of the prep was good.  (Suprep was used)  The instrument was then slowly withdrawn as the colon was fully examined. Estimated blood loss is zero unless otherwise noted in this procedure report.    COLON FINDINGS: A sessile polyp measuring 10 mm in size was found at the cecum.  A polypectomy was performed using snare cautery.  The resection was complete, the polyp tissue was completely retrieved and sent to histology.  A semi-pedunculated polyp measuring 25 mm in size was found in the ascending colon. It was located 3 folds distal to the IC valve and on the wall opposite of the IC valve. A polypectomy was performed in a piecemeal  fashion using snare cautery.  The resection was complete, the polyp tissue was completely retrieved and sent to histology.   A sessile polyp measuring 6 mm in size was found in the descending colon.  A polypectomy was performed with a cold snare.  The resection was complete, the polyp tissue was completely retrieved and sent to histology. Three sessile polyps measuring 4-5 mm in size were found in the rectum.  Polypectomies were performed with cold forceps (4 mm polyp) and with a cold snare (two 5 mm polyps).  The resection was complete, the polyp tissue was completely retrieved and sent to histology.  There was moderate diverticulosis noted in the sigmoid colon.   The examination was otherwise normal.  Retroflexed views revealed no abnormalities. The time to cecum = 2.0 Withdrawal time = 16.9   The scope was withdrawn and the procedure completed. COMPLICATIONS: There were no immediate complications.   ENDOSCOPIC IMPRESSION: 1.   Sessile polyp at the cecum; polypectomy was performed using snare cautery 2.   Semi-pedunculated polyp in the ascending colon; polypectomy performed in a piecemeal fashion using snare cautery 3.   Sessile polyp was found in the descending colon; polypectomy performed with a cold snare 4.   Three sessile polyps in the rectum; polypectomies performed with cold forceps and with a cold snare 5.   Moderate diverticulosis was noted in the sigmoid colon  RECOMMENDATIONS: 1.  Hold Aspirin and all other NSAIDS for 2 weeks. 2.  Await pathology results 3.  High fiber diet with liberal fluid  intake. 4.  Repeat Colonoscopy in 6-12 months pending pathology review  eSigned:  Ladene Artist, MD, [facility] 11/23/2014 8:34 AM     PATIENT NAME:  Mario, Gardner MR#: [id number]

## 2014-11-23 NOTE — Telephone Encounter (Signed)
Please order hgba1c, lipid, cmp, for 5/20 and let patient know. Hyperlipidemia, mixed. Also for diabetes 2

## 2014-11-23 NOTE — Telephone Encounter (Signed)
Caller name:Kathy Deane Relationship to patient: Can be reached:[phone] Pharmacy:  Reason for call:Pt is requesting A1C before Monday's app, please advise

## 2014-11-23 NOTE — Progress Notes (Signed)
Pt only passed a sm amount of air in recovery, pt chose to sit on toilette to see if he could get more air out, he was able to pass air in restroom but stated he felt some up high, advised to walk around and drink warm liquids when he got home to help pass more air, pt pretty much all but refused to pass any air in recovery, he was arguing with wife about it, so not sure how much he actually passed, but I did hear him pass air in the restroom-adm

## 2014-11-23 NOTE — Progress Notes (Signed)
Report to PACU, RN, vss, BBS= Clear.  

## 2014-11-23 NOTE — Patient Instructions (Signed)
YOU HAD AN ENDOSCOPIC PROCEDURE TODAY AT [facility]:   Refer to the procedure report that was given to you for any specific questions about what was found during the examination.  If the procedure report does not answer your questions, please call your gastroenterologist to clarify.  If you requested that your care partner not be given the details of your procedure findings, then the procedure report has been included in a sealed envelope for you to review at your convenience later.  YOU SHOULD EXPECT: Some feelings of bloating in the abdomen. Passage of more gas than usual.  Walking can help get rid of the air that was put into your GI tract during the procedure and reduce the bloating. If you had a lower endoscopy (such as a colonoscopy or flexible sigmoidoscopy) you may notice spotting of blood in your stool or on the toilet paper. If you underwent a bowel prep for your procedure, you may not have a normal bowel movement for a few days.  Please Note:  You might notice some irritation and congestion in your nose or some drainage.  This is from the oxygen used during your procedure.  There is no need for concern and it should clear up in a day or so.  SYMPTOMS TO REPORT IMMEDIATELY:   Following lower endoscopy (colonoscopy or flexible sigmoidoscopy):  Excessive amounts of blood in the stool  Significant tenderness or worsening of abdominal pains  Swelling of the abdomen that is new, acute  Fever of 100F or higher   For urgent or emergent issues, a gastroenterologist can be reached at any hour by calling [phone].   DIET: Your first meal following the procedure should be a small meal and then it is ok to progress to your normal diet. Heavy or fried foods are harder to digest and may make you feel nauseous or bloated.  Likewise, meals heavy in dairy and vegetables can increase bloating.  Drink plenty of fluids but you should avoid alcoholic beverages for 24  hours.  ACTIVITY:  You should plan to take it easy for the rest of today and you should NOT DRIVE or use heavy machinery until tomorrow (because of the sedation medicines used during the test).    FOLLOW UP: Our staff will call the number listed on your records the next business day following your procedure to check on you and address any questions or concerns that you may have regarding the information given to you following your procedure. If we do not reach you, we will leave a message.  However, if you are feeling well and you are not experiencing any problems, there is no need to return our call.  We will assume that you have returned to your regular daily activities without incident.  If any biopsies were taken you will be contacted by phone or by letter within the next 1-3 weeks.  Please call us at [phone] if you have not heard about the biopsies in 3 weeks.    SIGNATURES/CONFIDENTIALITY: You and/or your care partner have signed paperwork which will be entered into your electronic medical record.  These signatures attest to the fact that that the information above on your After Visit Summary has been reviewed and is understood.  Full responsibility of the confidentiality of this discharge information lies with you and/or your care-partner.  Polyps, diverticulosis, high fiber diet-handouts given  Repeat colonoscopy  6-12 months  Hold aspirin and NSAIDS(advil, aleve, motrin, naproxen, ibuprofen, etc)  for  2 weeks

## 2014-11-23 NOTE — Progress Notes (Signed)
Called to room to assist during endoscopic procedure.  Patient ID and intended procedure confirmed with present staff. Received instructions for my participation in the procedure from the performing physician.  

## 2014-11-24 NOTE — Telephone Encounter (Signed)
Documentation questions did not appear.  Pt states that he has had no problems following colonoscopy.  He has no questions re Colonoscopy.   He did state that he has had a very runny nose, with sneezing after nasal cannula being used yesterday.  Pt. advised that this was a side effect some pt. have experienced after sedation.  Advised that this would pass and if he felt he needed it could try antihistimine for symptom relief.

## 2014-11-24 NOTE — Addendum Note (Signed)
Addended by: Sharon Seller B on: 11/24/2014 08:21 AM   Modules accepted: Orders

## 2014-11-24 NOTE — Telephone Encounter (Signed)
Patient informed labs entered. Patient had already eaten this morning and was told by our office labs could no longer be done before an appointment.  The patient verbalized being very frustrated regarding messages not getting answered within a reasonable time (message left yesterday 11/23/14 at 8:29am regarding labs, but not entered/answered until this am 11/24/14) and then told could not do labs before appt. And then received a phone call labs entered, he was not happy!!

## 2014-11-27 ENCOUNTER — Ambulatory Visit (INDEPENDENT_AMBULATORY_CARE_PROVIDER_SITE_OTHER): Payer: Medicare Other | Admitting: Family Medicine

## 2014-11-27 ENCOUNTER — Encounter: Payer: Self-pay | Admitting: Family Medicine

## 2014-11-27 VITALS — BP 139/64 | HR 57 | Temp 97.6°F | Ht 72.0 in | Wt 171.4 lb

## 2014-11-27 DIAGNOSIS — N400 Enlarged prostate without lower urinary tract symptoms: Secondary | ICD-10-CM

## 2014-11-27 DIAGNOSIS — E1165 Type 2 diabetes mellitus with hyperglycemia: Secondary | ICD-10-CM | POA: Diagnosis not present

## 2014-11-27 DIAGNOSIS — E785 Hyperlipidemia, unspecified: Secondary | ICD-10-CM | POA: Diagnosis not present

## 2014-11-27 DIAGNOSIS — E118 Type 2 diabetes mellitus with unspecified complications: Secondary | ICD-10-CM

## 2014-11-27 DIAGNOSIS — IMO0002 Reserved for concepts with insufficient information to code with codable children: Secondary | ICD-10-CM

## 2014-11-27 DIAGNOSIS — Z86010 Personal history of colonic polyps: Secondary | ICD-10-CM

## 2014-11-27 LAB — LIPID PANEL
Cholesterol: 158 mg/dL (ref 0–200)
HDL: 56.9 mg/dL (ref >39.00)
LDL Cholesterol: 82 mg/dL (ref 0–99)
NonHDL: 101.1
TRIGLYCERIDES: 96 mg/dL (ref 0.0–149.0)
Total CHOL/HDL Ratio: 3
VLDL: 19.2 mg/dL (ref 0.0–40.0)

## 2014-11-27 LAB — COMPREHENSIVE METABOLIC PANEL
ALT: 11 U/L (ref 0–53)
AST: 13 U/L (ref 0–37)
Albumin: 4.3 g/dL (ref 3.5–5.2)
Alkaline Phosphatase: 64 U/L (ref 39–117)
BILIRUBIN TOTAL: 0.5 mg/dL (ref 0.2–1.2)
BUN: 17 mg/dL (ref 6–23)
CO2: 26 mEq/L (ref 19–32)
CREATININE: 1.07 mg/dL (ref 0.40–1.50)
Calcium: 9.4 mg/dL (ref 8.4–10.5)
Chloride: 103 mEq/L (ref 96–112)
GFR: 71.07 mL/min (ref >60.00)
Glucose, Bld: 141 mg/dL — ABNORMAL HIGH (ref 70–99)
POTASSIUM: 4.1 meq/L (ref 3.5–5.1)
Sodium: 136 mEq/L (ref 135–145)
TOTAL PROTEIN: 6.9 g/dL (ref 6.0–8.3)

## 2014-11-27 LAB — CBC
HCT: 44.4 % (ref 39.0–52.0)
HEMOGLOBIN: 15.1 g/dL (ref 13.0–17.0)
MCHC: 34 g/dL (ref 30.0–36.0)
MCV: 87.8 fl (ref 78.0–100.0)
Platelets: 220 10*3/uL (ref 150.0–400.0)
RBC: 5.06 Mil/uL (ref 4.22–5.81)
RDW: 13.5 % (ref 11.5–15.5)
WBC: 8 10*3/uL (ref 4.0–10.5)

## 2014-11-27 LAB — TSH: TSH: 1.72 u[IU]/mL (ref 0.35–4.50)

## 2014-11-27 LAB — HEMOGLOBIN A1C: Hgb A1c MFr Bld: 6.9 % — ABNORMAL HIGH (ref 4.6–6.5)

## 2014-11-27 MED ORDER — SITAGLIPTIN PHOSPHATE 100 MG PO TABS: 100.0000 mg | ORAL_TABLET | Freq: Every day | ORAL | Status: DC

## 2014-11-27 MED ORDER — SIMVASTATIN 10 MG PO TABS: 10.0000 mg | ORAL_TABLET | Freq: Every day | ORAL | Status: DC

## 2014-11-27 MED ORDER — METFORMIN HCL 1000 MG PO TABS: ORAL_TABLET | ORAL | Status: DC

## 2014-11-27 MED ORDER — VALACYCLOVIR HCL 1 G PO TABS: 1000.0000 mg | ORAL_TABLET | Freq: Every day | ORAL | Status: DC

## 2014-11-27 NOTE — Progress Notes (Signed)
[name]  [id number] Jan 05, 1935 11/27/2014      Progress Note-Follow Up  Subjective  Chief Complaint  Chief Complaint  Patient presents with  . Follow-up    HPI  Patient is a 80 y.o. male in today for routine medical care. Patient is in today for follow-up and generally feeling well. He does note recent colonoscopy revealed 6 polyps but he is moving his bowels well since then. His blood sugars have generally been well controlled. He did have a high of 173 but he was on vacation and had had a significant number of carbs that day. At home his numbers at generally been between 80 and 130. No polyuria or polydipsia. Denies CP/palp/SOB/HA/congestion/fevers/GI or GU c/o. Taking meds as prescribed  Past Medical History  Diagnosis Date  . Diabetes mellitus 2000    type 2  . Hyperlipidemia   . GERD (gastroesophageal reflux disease)   . Glaucoma     followed by Dr Lanell Matar at [facility]  . BPH (benign prostatic hypertrophy)   . Vasomotor rhinitis 05/02/2013    Past Surgical History  Procedure Laterality Date  . Appendectomy  1943    Family History  Problem Relation Age of Onset  . Cancer Sister     breast  . Heart disease Father     MI  . Diabetes Father   . Arthritis Brother     knees  . Heart disease Brother     a fib  . Colon cancer Neg Hx     History   Social History  . Marital Status: Married    Spouse Name: Stanton Kidney  . Number of Children: 0  . Years of Education: N/A   Occupational History  . semi retired Education administrator  .     Social History Main Topics  . Smoking status: Never Smoker   . Smokeless tobacco: Never Used  . Alcohol Use: 0.0 oz/week    0 Standard drinks or equivalent per week     Comment: once monthly "maybe"  . Drug Use: No  . Sexual Activity: Yes     Comment: lives with wife, travels frequently, no major dietary restrictions. exercises regularly with aerobic, stretch and weights   Other Topics Concern  . Not on file   Social  History Narrative    Current Outpatient Prescriptions on File Prior to Visit  Medication Sig Dispense Refill  . brimonidine-timolol (COMBIGAN) 0.2-0.5 % ophthalmic solution Place 1 drop into both eyes 2 (two) times daily.      . brinzolamide (AZOPT) 1 % ophthalmic suspension Place 1 drop into both eyes 3 (three) times daily.      Marland Kitchen ipratropium (ATROVENT) 0.03 % nasal spray PLACE 2 SPRAYS INTO THE NOSE EVERY 12 HOURS 90 mL 1  . ranitidine (ZANTAC) 150 MG tablet Take 1 tablet (150 mg total) by mouth daily. 90 tablet 3  . tamsulosin (FLOMAX) 0.4 MG CAPS capsule Take 1 capsule (0.4 mg total) by mouth daily. 90 capsule 1  . travoprost, benzalkonium, (TRAVATAN) 0.004 % ophthalmic solution 1 drop at bedtime.      . vardenafil (LEVITRA) 20 MG tablet Take 1 tablet (20 mg total) by mouth daily as needed. 30 tablet 1   No current facility-administered medications on file prior to visit.    Allergies  Allergen Reactions  . Levaquin [Levofloxacin In D5w]     hives    Review of Systems  Review of Systems  Constitutional: Negative for fever, chills and malaise/fatigue.  HENT: Negative  for congestion, hearing loss and nosebleeds.   Eyes: Negative for discharge.  Respiratory: Negative for cough, sputum production, shortness of breath and wheezing.   Cardiovascular: Negative for chest pain, palpitations and leg swelling.  Gastrointestinal: Negative for heartburn, nausea, vomiting, abdominal pain, diarrhea, constipation and blood in stool.  Genitourinary: Negative for dysuria, urgency, frequency and hematuria.  Musculoskeletal: Negative for myalgias, back pain and falls.  Skin: Negative for rash.  Neurological: Negative for dizziness, tremors, sensory change, focal weakness, loss of consciousness, weakness and headaches.  Endo/Heme/Allergies: Negative for polydipsia. Does not bruise/bleed easily.  Psychiatric/Behavioral: Negative for depression and suicidal ideas. The patient is not nervous/anxious  and does not have insomnia.     Objective  BP 139/64 mmHg  Pulse 57  Temp(Src) 97.6 F (36.4 C) (Oral)  Ht 6' (1.829 m)  Wt 171 lb 6 oz (77.735 kg)  BMI 23.24 kg/m2  SpO2 100%  Physical Exam  Physical Exam  Constitutional: He is oriented to person, place, and time and well-developed, well-nourished, and in no distress. No distress.  HENT:  Head: Normocephalic and atraumatic.  Eyes: Conjunctivae are normal.  Neck: Neck supple. No thyromegaly present.  Cardiovascular: Normal rate, regular rhythm and normal heart sounds.   No murmur heard. Pulmonary/Chest: Effort normal and breath sounds normal. No respiratory distress.  Abdominal: He exhibits no distension and no mass. There is no tenderness.  Musculoskeletal: He exhibits no edema.  Neurological: He is alert and oriented to person, place, and time.  Skin: Skin is warm.  Psychiatric: Memory, affect and judgment normal.    Lab Results  Component Value Date   TSH 1.58 05/17/2014   Lab Results  Component Value Date   WBC 7.8 05/17/2014   HGB 13.9 05/17/2014   HCT 41.4 05/17/2014   MCV 87.7 05/17/2014   PLT 233.0 05/17/2014   Lab Results  Component Value Date   CREATININE 1.1 05/17/2014   BUN 18 05/17/2014   NA 138 05/17/2014   K 4.5 05/17/2014   CL 105 05/17/2014   CO2 27 05/17/2014   Lab Results  Component Value Date   ALT 12 05/17/2014   AST 12 05/17/2014   ALKPHOS 48 05/17/2014   BILITOT 0.6 05/17/2014   Lab Results  Component Value Date   CHOL 141 05/17/2014   Lab Results  Component Value Date   HDL 46.80 05/17/2014   Lab Results  Component Value Date   LDLCALC 78 05/17/2014   Lab Results  Component Value Date   TRIG 79.0 05/17/2014   Lab Results  Component Value Date   CHOLHDL 3 05/17/2014     Assessment & Plan  Diabetes mellitus type 2, uncontrolled Hi of last month was 173, 159, 169 with a microbrew with dinner  Now without beer sugars 115-120 hgba1c acceptable, minimize simple  carbs. Increase exercise as tolerated. Continue current meds   Hyperlipidemia Tolerating statin, encouraged heart healthy diet, avoid trans fats, minimize simple carbs and saturated fats. Increase exercise as tolerated   BPH (benign prostatic hyperplasia) Good response to Tamulosin   History of colonic polyps Colonoscopy revealed 1/2 dozen polyps, needs repeat colonoscopy within 12 months. No bowel concerns otherwise

## 2014-11-27 NOTE — Progress Notes (Signed)
Pre visit review using our clinic review tool, if applicable. No additional management support is needed unless otherwise documented below in the visit note. 

## 2014-11-27 NOTE — Assessment & Plan Note (Addendum)
Hi of last month was 173, 159, 169 with a microbrew with dinner  Now without beer sugars 115-120 hgba1c acceptable, minimize simple carbs. Increase exercise as tolerated. Continue current meds

## 2014-11-29 ENCOUNTER — Encounter: Payer: Self-pay | Admitting: Gastroenterology

## 2014-12-04 ENCOUNTER — Encounter: Payer: Self-pay | Admitting: Family Medicine

## 2014-12-04 DIAGNOSIS — Z86010 Personal history of colonic polyps: Secondary | ICD-10-CM | POA: Insufficient documentation

## 2014-12-04 NOTE — Assessment & Plan Note (Signed)
Colonoscopy revealed 1/2 dozen polyps, needs repeat colonoscopy within 12 months. No bowel concerns otherwise

## 2014-12-04 NOTE — Assessment & Plan Note (Signed)
Good response to Tamulosin

## 2014-12-04 NOTE — Assessment & Plan Note (Signed)
Tolerating statin, encouraged heart healthy diet, avoid trans fats, minimize simple carbs and saturated fats. Increase exercise as tolerated 

## 2015-01-16 ENCOUNTER — Other Ambulatory Visit: Payer: Self-pay | Admitting: Family Medicine

## 2015-04-03 ENCOUNTER — Telehealth: Payer: Self-pay | Admitting: Family Medicine

## 2015-04-03 NOTE — Telephone Encounter (Signed)
Called the patient left a detailed message that we have not received any information from [facility] and to call them back have refax to my attention.  Will certainly take care of as soon as receive.

## 2015-04-03 NOTE — Telephone Encounter (Signed)
Please call pt when RXs are sent

## 2015-04-03 NOTE — Telephone Encounter (Signed)
Pt said that [facility] faxed request for diabetic testing supplies to Dr. Charlett Blake on 03/23/15.  [facility] Ph# [phone] Pt ID # [id number]  Pt needs a new meter, test strips, but not needing lancets (unless they are different than std). Please send asap. Pt is out of test strips since yesterday.

## 2015-04-04 NOTE — Telephone Encounter (Signed)
Pt calling and stating he is upset that we are requesting the fax from them and why we won't just fax it to them. He said that they were faxing to [phone] and it won't go thru. Pt is providing them with [phone] and having them fax to Attn: Robin.

## 2015-04-04 NOTE — Telephone Encounter (Signed)
Form received via fax. Filled out as much as possible and forwarded to Dr. Charlett Blake for review/signature. JG//CMA

## 2015-04-06 ENCOUNTER — Other Ambulatory Visit: Payer: Self-pay | Admitting: Family Medicine

## 2015-04-06 NOTE — Telephone Encounter (Signed)
Signed form faxed to [facility] successfully. JG//CMA

## 2015-04-26 ENCOUNTER — Encounter: Payer: Self-pay | Admitting: Gastroenterology

## 2015-05-04 ENCOUNTER — Encounter: Payer: Self-pay | Admitting: Gastroenterology

## 2015-05-28 ENCOUNTER — Other Ambulatory Visit (INDEPENDENT_AMBULATORY_CARE_PROVIDER_SITE_OTHER): Payer: Medicare Other

## 2015-05-28 DIAGNOSIS — E1165 Type 2 diabetes mellitus with hyperglycemia: Secondary | ICD-10-CM | POA: Diagnosis not present

## 2015-05-28 DIAGNOSIS — E785 Hyperlipidemia, unspecified: Secondary | ICD-10-CM

## 2015-05-28 DIAGNOSIS — IMO0002 Reserved for concepts with insufficient information to code with codable children: Secondary | ICD-10-CM

## 2015-05-28 DIAGNOSIS — N400 Enlarged prostate without lower urinary tract symptoms: Secondary | ICD-10-CM

## 2015-05-28 LAB — COMPREHENSIVE METABOLIC PANEL
ALBUMIN: 4.1 g/dL (ref 3.5–5.2)
ALT: 11 U/L (ref 0–53)
AST: 11 U/L (ref 0–37)
Alkaline Phosphatase: 63 U/L (ref 39–117)
BILIRUBIN TOTAL: 0.6 mg/dL (ref 0.2–1.2)
BUN: 15 mg/dL (ref 6–23)
CHLORIDE: 105 meq/L (ref 96–112)
CO2: 27 mEq/L (ref 19–32)
CREATININE: 1.1 mg/dL (ref 0.40–1.50)
Calcium: 9.5 mg/dL (ref 8.4–10.5)
GFR: 68.75 mL/min (ref >60.00)
Glucose, Bld: 184 mg/dL — ABNORMAL HIGH (ref 70–99)
Potassium: 4.7 mEq/L (ref 3.5–5.1)
SODIUM: 139 meq/L (ref 135–145)
Total Protein: 6.6 g/dL (ref 6.0–8.3)

## 2015-05-28 LAB — LIPID PANEL
Cholesterol: 156 mg/dL (ref 0–200)
HDL: 53.7 mg/dL (ref >39.00)
LDL CALC: 81 mg/dL (ref 0–99)
NonHDL: 102.23
TRIGLYCERIDES: 105 mg/dL (ref 0.0–149.0)
Total CHOL/HDL Ratio: 3
VLDL: 21 mg/dL (ref 0.0–40.0)

## 2015-05-28 LAB — TSH: TSH: 2.33 u[IU]/mL (ref 0.35–4.50)

## 2015-05-28 LAB — PSA, MEDICARE: PSA: 2.9 ng/ml (ref 0.10–4.00)

## 2015-05-28 LAB — HEMOGLOBIN A1C: Hgb A1c MFr Bld: 7.3 % — ABNORMAL HIGH (ref 4.6–6.5)

## 2015-05-29 ENCOUNTER — Telehealth: Payer: Self-pay | Admitting: Family Medicine

## 2015-05-29 LAB — CBC
HCT: 45.2 % (ref 39.0–52.0)
Hemoglobin: 15.1 g/dL (ref 13.0–17.0)
MCHC: 33.3 g/dL (ref 30.0–36.0)
MCV: 88.7 fl (ref 78.0–100.0)
Platelets: 222 10*3/uL (ref 150.0–400.0)
RBC: 5.1 Mil/uL (ref 4.22–5.81)
RDW: 14.1 % (ref 11.5–15.5)
WBC: 7.3 10*3/uL (ref 4.0–10.5)

## 2015-05-29 NOTE — Telephone Encounter (Signed)
Pt is returning your call for lab results.    CB: [id number]

## 2015-05-30 ENCOUNTER — Telehealth: Payer: Self-pay | Admitting: Behavioral Health

## 2015-05-30 NOTE — Telephone Encounter (Signed)
Informed patient of lab results/provider's recommendations below:  Notes Recorded by Mosie Lukes, MD on 05/29/2015 at 2:33 PM Notify looks good, sugar up but just slightly, watch the simple carbs and we can discuss at visit Notes Recorded by Mosie Lukes, MD on 05/29/2015 at 12:41 PM Notify normal PSA, thanks  Patient understood and voiced that he would speak with his PCP further at his next visit.

## 2015-06-04 ENCOUNTER — Ambulatory Visit (INDEPENDENT_AMBULATORY_CARE_PROVIDER_SITE_OTHER): Payer: Medicare Other | Admitting: Family Medicine

## 2015-06-04 ENCOUNTER — Telehealth: Payer: Self-pay | Admitting: Family Medicine

## 2015-06-04 ENCOUNTER — Encounter: Payer: Self-pay | Admitting: Family Medicine

## 2015-06-04 VITALS — BP 106/72 | HR 61 | Temp 97.6°F | Ht 73.0 in | Wt 168.2 lb

## 2015-06-04 DIAGNOSIS — H409 Unspecified glaucoma: Secondary | ICD-10-CM

## 2015-06-04 DIAGNOSIS — E785 Hyperlipidemia, unspecified: Secondary | ICD-10-CM | POA: Diagnosis not present

## 2015-06-04 DIAGNOSIS — E1310 Other specified diabetes mellitus with ketoacidosis without coma: Secondary | ICD-10-CM | POA: Diagnosis not present

## 2015-06-04 DIAGNOSIS — Z23 Encounter for immunization: Secondary | ICD-10-CM | POA: Diagnosis not present

## 2015-06-04 DIAGNOSIS — H9193 Unspecified hearing loss, bilateral: Secondary | ICD-10-CM

## 2015-06-04 DIAGNOSIS — N400 Enlarged prostate without lower urinary tract symptoms: Secondary | ICD-10-CM

## 2015-06-04 DIAGNOSIS — K219 Gastro-esophageal reflux disease without esophagitis: Secondary | ICD-10-CM | POA: Diagnosis not present

## 2015-06-04 DIAGNOSIS — E1110 Type 2 diabetes mellitus with ketoacidosis without coma: Secondary | ICD-10-CM

## 2015-06-04 MED ORDER — VARDENAFIL HCL 20 MG PO TABS: 20.0000 mg | ORAL_TABLET | Freq: Every day | ORAL | Status: DC | PRN

## 2015-06-04 MED ORDER — GLYBURIDE 2.5 MG PO TABS: 2.5000 mg | ORAL_TABLET | Freq: Every day | ORAL | Status: DC

## 2015-06-04 NOTE — Assessment & Plan Note (Signed)
Avoid offending foods, start probiotics. Do not eat large meals in late evening and consider raising head of bed.  

## 2015-06-04 NOTE — Patient Instructions (Signed)

## 2015-06-04 NOTE — Progress Notes (Signed)
Pre visit review using our clinic review tool, if applicable. No additional management support is needed unless otherwise documented below in the visit note. 

## 2015-06-04 NOTE — Telephone Encounter (Signed)
I would actually offer him 2 options. 3 mn visit with MD for med management and labs and a wellness visit with RN. And/or 3 mn f/u with me and a 6 mn AWV/CPE with me

## 2015-06-04 NOTE — Assessment & Plan Note (Signed)
Tolerating statin, encouraged heart healthy diet, avoid trans fats, minimize simple carbs and saturated fats. Increase exercise as tolerated 

## 2015-06-04 NOTE — Assessment & Plan Note (Signed)
A1C is up. Add Glyburide 2.5 mg daily and minimize simple carbs.

## 2015-06-04 NOTE — Assessment & Plan Note (Signed)
Wears hearing aides, working well and no wax

## 2015-06-04 NOTE — Assessment & Plan Note (Signed)
Tamulosin is helpful discussed possible referral to urology, declines for now will let us know if symptoms worsen

## 2015-06-04 NOTE — Telephone Encounter (Signed)
Left voicemail advising patient to call to schedule appointment

## 2015-06-04 NOTE — Assessment & Plan Note (Signed)
Follows closely with opthamology due to recurrent trouble with increased pressure in left eye. Has an appt for Laser surgery in left eye with Dr Venetia Maxon in December

## 2015-06-04 NOTE — Telephone Encounter (Signed)
Patient was seen today and as per AVS: Return in about 3 months (around 09/06/2015) for Medicare wellness and PE. Next physical appointment is not until May, patient states PCP is monitoring a medication and may not want him to wait that long please advise if May is okay for physical or schedule follow up in March.

## 2015-06-09 ENCOUNTER — Encounter: Payer: Self-pay | Admitting: Family Medicine

## 2015-06-09 NOTE — Progress Notes (Signed)
Subjective:    Patient ID: [name], male    DOB: 1934/11/29, 80 y.o.   MRN: [id number]  Chief Complaint  Patient presents with  . Follow-up    HPI Patient is in today for follow up. No recent illnes. Feels well. Has another surgery scheduled on his left eye for increased pressure. NO HA or visual changes. Surgeon is Dr Venetia Maxon. No recent hospitalization. Denies CP/palp/SOB/HA/congestion/fevers/GI or GU c/o. Taking meds as prescribed  Past Medical History  Diagnosis Date  . Diabetes mellitus 2000    type 2  . Hyperlipidemia   . GERD (gastroesophageal reflux disease)   . Glaucoma     followed by Dr Lanell Matar at [facility]  . BPH (benign prostatic hypertrophy)   . Vasomotor rhinitis 05/02/2013  . History of colonic polyps 12/04/2014    Past Surgical History  Procedure Laterality Date  . Appendectomy  1943  . Eye surgery      laser surgery on left numerous times.     Family History  Problem Relation Age of Onset  . Cancer Sister     breast  . Heart disease Father     MI  . Diabetes Father   . Arthritis Brother     knees  . Heart disease Brother     a fib  . Colon cancer Neg Hx     Social History   Social History  . Marital Status: Married    Spouse Name: Stanton Kidney  . Number of Children: 0  . Years of Education: N/A   Occupational History  . semi retired Education administrator  .     Social History Main Topics  . Smoking status: Never Smoker   . Smokeless tobacco: Never Used  . Alcohol Use: 0.0 oz/week    0 Standard drinks or equivalent per week     Comment: once monthly "maybe"  . Drug Use: No  . Sexual Activity: Yes     Comment: lives with wife, travels frequently, no major dietary restrictions. exercises regularly with aerobic, stretch and weights   Other Topics Concern  . Not on file   Social History Narrative    Outpatient Prescriptions Prior to Visit  Medication Sig Dispense Refill  . brimonidine-timolol (COMBIGAN) 0.2-0.5 % ophthalmic solution  Place 1 drop into both eyes 2 (two) times daily.      . brinzolamide (AZOPT) 1 % ophthalmic suspension Place 1 drop into both eyes 3 (three) times daily.      Marland Kitchen ipratropium (ATROVENT) 0.03 % nasal spray PLACE 2 SPRAYS INTO THE NOSE EVERY 12 HOURS 90 mL 1  . metFORMIN (GLUCOPHAGE) 1000 MG tablet TAKE 1 TABLET TWICE A DAY AND ONE-HALF (1/2) TABLET EVERY NOON 150 tablet 1  . ranitidine (ZANTAC) 150 MG tablet Take 1 tablet (150 mg total) by mouth daily. 90 tablet 3  . simvastatin (ZOCOR) 10 MG tablet Take 1 tablet (10 mg total) by mouth at bedtime. 90 tablet 1  . sitaGLIPtin (JANUVIA) 100 MG tablet Take 1 tablet (100 mg total) by mouth daily. 90 tablet 1  . tamsulosin (FLOMAX) 0.4 MG CAPS capsule TAKE 1 CAPSULE DAILY 90 capsule 1  . travoprost, benzalkonium, (TRAVATAN) 0.004 % ophthalmic solution 1 drop at bedtime.      . valACYclovir (VALTREX) 1000 MG tablet Take 1 tablet (1,000 mg total) by mouth daily. 90 tablet 0  . vardenafil (LEVITRA) 20 MG tablet Take 1 tablet (20 mg total) by mouth daily as needed. 30 tablet 1   No  facility-administered medications prior to visit.    Allergies  Allergen Reactions  . Levaquin [Levofloxacin In D5w]     hives    Review of Systems  Constitutional: Negative for fever and malaise/fatigue.  HENT: Negative for congestion.   Eyes: Negative for discharge.  Respiratory: Negative for shortness of breath.   Cardiovascular: Negative for chest pain, palpitations and leg swelling.  Gastrointestinal: Negative for nausea and abdominal pain.  Genitourinary: Negative for dysuria.  Musculoskeletal: Negative for falls.  Skin: Negative for rash.  Neurological: Negative for loss of consciousness and headaches.  Endo/Heme/Allergies: Negative for environmental allergies.  Psychiatric/Behavioral: Negative for depression. The patient is not nervous/anxious.        Objective:    Physical Exam  Constitutional: He is oriented to person, place, and time. He appears  well-developed and well-nourished. No distress.  HENT:  Head: Normocephalic and atraumatic.  Nose: Nose normal.  Eyes: Right eye exhibits no discharge. Left eye exhibits no discharge.  Neck: Normal range of motion. Neck supple.  Cardiovascular: Normal rate and regular rhythm.   No murmur heard. Pulmonary/Chest: Effort normal and breath sounds normal.  Abdominal: Soft. Bowel sounds are normal. There is no tenderness.  Musculoskeletal: He exhibits no edema.  Neurological: He is alert and oriented to person, place, and time.  Skin: Skin is warm and dry.  Psychiatric: He has a normal mood and affect.  Nursing note and vitals reviewed.   BP 106/72 mmHg  Pulse 61  Temp(Src) 97.6 F (36.4 C) (Oral)  Ht 6\' 1"  (1.854 m)  Wt 168 lb 4 oz (76.318 kg)  BMI 22.20 kg/m2  SpO2 97% Wt Readings from Last 3 Encounters:  06/04/15 168 lb 4 oz (76.318 kg)  11/27/14 171 lb 6 oz (77.735 kg)  11/23/14 172 lb (78.019 kg)     Lab Results  Component Value Date   WBC 7.3 05/28/2015   HGB 15.1 05/28/2015   HCT 45.2 05/28/2015   PLT 222.0 05/28/2015   GLUCOSE 184* 05/28/2015   CHOL 156 05/28/2015   TRIG 105.0 05/28/2015   HDL 53.70 05/28/2015   LDLCALC 81 05/28/2015   ALT 11 05/28/2015   AST 11 05/28/2015   NA 139 05/28/2015   K 4.7 05/28/2015   CL 105 05/28/2015   CREATININE 1.10 05/28/2015   BUN 15 05/28/2015   CO2 27 05/28/2015   TSH 2.33 05/28/2015   PSA 2.90 05/28/2015   HGBA1C 7.3* 05/28/2015   MICROALBUR 0.52 07/01/2012    Lab Results  Component Value Date   TSH 2.33 05/28/2015   Lab Results  Component Value Date   WBC 7.3 05/28/2015   HGB 15.1 05/28/2015   HCT 45.2 05/28/2015   MCV 88.7 05/28/2015   PLT 222.0 05/28/2015   Lab Results  Component Value Date   NA 139 05/28/2015   K 4.7 05/28/2015   CO2 27 05/28/2015   GLUCOSE 184* 05/28/2015   BUN 15 05/28/2015   CREATININE 1.10 05/28/2015   BILITOT 0.6 05/28/2015   ALKPHOS 63 05/28/2015   AST 11 05/28/2015   ALT  11 05/28/2015   PROT 6.6 05/28/2015   ALBUMIN 4.1 05/28/2015   CALCIUM 9.5 05/28/2015   GFR 68.75 05/28/2015   Lab Results  Component Value Date   CHOL 156 05/28/2015   Lab Results  Component Value Date   HDL 53.70 05/28/2015   Lab Results  Component Value Date   LDLCALC 81 05/28/2015   Lab Results  Component Value Date   TRIG  105.0 05/28/2015   Lab Results  Component Value Date   CHOLHDL 3 05/28/2015   Lab Results  Component Value Date   HGBA1C 7.3* 05/28/2015       Assessment & Plan:   Problem List Items Addressed This Visit    BPH (benign prostatic hyperplasia)    Tamulosin is helpful discussed possible referral to urology, declines for now will let us know if symptoms worsen      Relevant Orders   TSH   CBC   Lipid panel   Hemoglobin A1c   Comprehensive metabolic panel   Urine Microalbumin w/creat. ratio   Diabetes mellitus type 2, uncontrolled (HCC)    A1C is up. Add Glyburide 2.5 mg daily and minimize simple carbs.      Relevant Medications   glyBURIDE (DIABETA) 2.5 MG tablet   Other Relevant Orders   TSH   CBC   Lipid panel   Hemoglobin A1c   Comprehensive metabolic panel   Urine Microalbumin w/creat. ratio   GERD    Avoid offending foods, start probiotics. Do not eat large meals in late evening and consider raising head of bed.       Relevant Orders   TSH   CBC   Lipid panel   Hemoglobin A1c   Comprehensive metabolic panel   Urine Microalbumin w/creat. ratio   GLAUCOMA    Follows closely with opthamology due to recurrent trouble with increased pressure in left eye. Has an appt for Laser surgery in left eye with Dr Venetia Maxon in December      Relevant Medications   RESTASIS 0.05 % ophthalmic emulsion   TRAVATAN Z 0.004 % SOLN ophthalmic solution   Hearing loss    Wears hearing aides, working well and no wax      Hyperlipidemia    Tolerating statin, encouraged heart healthy diet, avoid trans fats, minimize simple carbs and  saturated fats. Increase exercise as tolerated      Relevant Medications   vardenafil (LEVITRA) 20 MG tablet   Other Relevant Orders   TSH   CBC   Lipid panel   Hemoglobin A1c   Comprehensive metabolic panel   Urine Microalbumin w/creat. ratio    Other Visit Diagnoses    Encounter for immunization    -  Primary    Need for vaccination with 13-polyvalent pneumococcal conjugate vaccine        Relevant Orders    Pneumococcal conjugate vaccine 13-valent (Completed)       I am having Mr. Caserta start on glyBURIDE. I am also having him maintain his brinzolamide, brimonidine-timolol, travoprost (benzalkonium), ipratropium, ranitidine, sitaGLIPtin, metFORMIN, valACYclovir, simvastatin, tamsulosin, RESTASIS, TRAVATAN Z, and vardenafil.  Meds ordered this encounter  Medications  . RESTASIS 0.05 % ophthalmic emulsion    Sig: Place 1 drop into both eyes 2 (two) times daily.   . TRAVATAN Z 0.004 % SOLN ophthalmic solution    Sig: Place 1 drop into both eyes at bedtime.   . vardenafil (LEVITRA) 20 MG tablet    Sig: Take 1 tablet (20 mg total) by mouth daily as needed.    Dispense:  30 tablet    Refill:  1  . glyBURIDE (DIABETA) 2.5 MG tablet    Sig: Take 1 tablet (2.5 mg total) by mouth daily with breakfast.    Dispense:  90 tablet    Refill:  1     Pavle Wiler, MD

## 2015-06-17 ENCOUNTER — Other Ambulatory Visit: Payer: Self-pay | Admitting: Family Medicine

## 2015-06-27 ENCOUNTER — Other Ambulatory Visit: Payer: Self-pay | Admitting: Family Medicine

## 2015-06-27 ENCOUNTER — Ambulatory Visit (AMBULATORY_SURGERY_CENTER): Payer: Self-pay

## 2015-06-27 VITALS — Ht 72.5 in | Wt 172.8 lb

## 2015-06-27 DIAGNOSIS — Z860100 Personal history of colon polyps, unspecified: Secondary | ICD-10-CM

## 2015-06-27 MED ORDER — IPRATROPIUM BROMIDE 0.03 % NA SOLN: NASAL | Status: DC

## 2015-06-27 MED ORDER — SUPREP BOWEL PREP KIT 17.5-3.13-1.6 GM/177ML PO SOLN: 1.0000 | Freq: Once | ORAL | Status: DC

## 2015-06-27 NOTE — Progress Notes (Signed)
NO ALLERGIES TO EGGS OR SOY NO DIET/WEIGHT LOSS MEDS NO HOME OXYGEN NO PAST PROBLEMS WITH ANESTHESIA  PT REPORTED HE HAD TERRIBLE TIME WITH HIS SINUSES AFTER O2 USE.  SYMPTOMS FOR 2 WEEKS AFTER, PRIMARILY RUNNY NOSE, HAS HX OF DEVIATED SEPTUM.  HAS EMAIL AND INTERNET;REFUSED EMMI

## 2015-06-27 NOTE — Telephone Encounter (Signed)
Refill done.  

## 2015-07-11 ENCOUNTER — Encounter: Payer: Self-pay | Admitting: Gastroenterology

## 2015-07-11 ENCOUNTER — Ambulatory Visit (AMBULATORY_SURGERY_CENTER): Payer: Medicare Other | Admitting: Gastroenterology

## 2015-07-11 VITALS — BP 130/70 | HR 56 | Temp 97.6°F | Resp 21 | Ht 78.0 in | Wt 172.0 lb

## 2015-07-11 DIAGNOSIS — D120 Benign neoplasm of cecum: Secondary | ICD-10-CM | POA: Diagnosis not present

## 2015-07-11 DIAGNOSIS — Z86010 Personal history of colonic polyps: Secondary | ICD-10-CM | POA: Diagnosis not present

## 2015-07-11 DIAGNOSIS — K635 Polyp of colon: Secondary | ICD-10-CM | POA: Diagnosis not present

## 2015-07-11 MED ORDER — SODIUM CHLORIDE 0.9 % IV SOLN: 500.0000 mL | INTRAVENOUS | Status: DC

## 2015-07-11 NOTE — Patient Instructions (Signed)
YOU HAD AN ENDOSCOPIC PROCEDURE TODAY AT [facility]:   Refer to the procedure report that was given to you for any specific questions about what was found during the examination.  If the procedure report does not answer your questions, please call your gastroenterologist to clarify.  If you requested that your care partner not be given the details of your procedure findings, then the procedure report has been included in a sealed envelope for you to review at your convenience later.  YOU SHOULD EXPECT: Some feelings of bloating in the abdomen. Passage of more gas than usual.  Walking can help get rid of the air that was put into your GI tract during the procedure and reduce the bloating. If you had a lower endoscopy (such as a colonoscopy or flexible sigmoidoscopy) you may notice spotting of blood in your stool or on the toilet paper. If you underwent a bowel prep for your procedure, you may not have a normal bowel movement for a few days.  Please Note:  You might notice some irritation and congestion in your nose or some drainage.  This is from the oxygen used during your procedure.  There is no need for concern and it should clear up in a day or so.  SYMPTOMS TO REPORT IMMEDIATELY:   Following lower endoscopy (colonoscopy or flexible sigmoidoscopy):  Excessive amounts of blood in the stool  Significant tenderness or worsening of abdominal pains  Swelling of the abdomen that is new, acute  Fever of 100F or higher   For urgent or emergent issues, a gastroenterologist can be reached at any hour by calling [phone].   DIET: Your first meal following the procedure should be a small meal and then it is ok to progress to your normal diet. Heavy or fried foods are harder to digest and may make you feel nauseous or bloated.  Likewise, meals heavy in dairy and vegetables can increase bloating.  Drink plenty of fluids but you should avoid alcoholic beverages for 24  hours.  ACTIVITY:  You should plan to take it easy for the rest of today and you should NOT DRIVE or use heavy machinery until tomorrow (because of the sedation medicines used during the test).    FOLLOW UP: Our staff will call the number listed on your records the next business day following your procedure to check on you and address any questions or concerns that you may have regarding the information given to you following your procedure. If we do not reach you, we will leave a message.  However, if you are feeling well and you are not experiencing any problems, there is no need to return our call.  We will assume that you have returned to your regular daily activities without incident.  If any biopsies were taken you will be contacted by phone or by letter within the next 1-3 weeks.  Please call us at [phone] if you have not heard about the biopsies in 3 weeks.    SIGNATURES/CONFIDENTIALITY: You and/or your care partner have signed paperwork which will be entered into your electronic medical record.  These signatures attest to the fact that that the information above on your After Visit Summary has been reviewed and is understood.  Full responsibility of the confidentiality of this discharge information lies with you and/or your care-partner.   Handouts were given to your care partner on polyps, diverticulosis, and a high fiber diet with liberal fluid intake. Your blood sugar was  102 in the recovery room. You may resume your current medications today. Await biopsy results. Please call if any questions or concerns.

## 2015-07-11 NOTE — Progress Notes (Signed)
Called to room to assist during endoscopic procedure.  Patient ID and intended procedure confirmed with present staff. Received instructions for my participation in the procedure from the performing physician.  

## 2015-07-11 NOTE — Op Note (Signed)
[place]  [address] [place], [id number]   COLONOSCOPY PROCEDURE REPORT  PATIENT: [name], [name]  MR#: [id number] BIRTHDATE: 1934/12/23 , 80  yrs. old GENDER: male ENDOSCOPIST: Ladene Artist, MD, [facility] PROCEDURE DATE:  07/11/2015 PROCEDURE:   Colonoscopy, surveillance and Colonoscopy with snare polypectomy First Screening Colonoscopy - Avg.  risk and is 50 yrs.  old or older - No.  Prior Negative Screening - Now for repeat screening. N/A  History of Adenoma - Now for follow-up colonoscopy & has been > or = to 3 yrs.  No.  It has been less than 3 yrs since last colonoscopy.  Medical reason.  Polyps removed today? 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 MEDICATIONS: Monitored anesthesia care and Propofol 200 mg IV DESCRIPTION OF PROCEDURE:   After the risks benefits and alternatives of the procedure were thoroughly explained, informed consent was obtained.  The digital rectal exam revealed no abnormalities of the rectum.   The LB [id number]  endoscope was introduced through the anus and advanced to the cecum, which was identified by both the appendix and ileocecal valve. No adverse events experienced.   The quality of the prep was good.  (Suprep was used)  The instrument was then slowly withdrawn as the colon was fully examined. Estimated blood loss is zero unless otherwise noted in this procedure report.    COLON FINDINGS: A sessile polyp measuring 6 mm in size was found at the cecum.  A polypectomy was performed with a cold snare.  The resection was complete, the polyp tissue was completely retrieved and sent to histology.   There was moderate diverticulosis noted in the sigmoid colon with associated colonic spasm, muscular hypertrophy and luminal narrowing.   The colonic mucosa appeared normal at the splenic flexure, in the  descending colon, rectum, transverse colon, at the ileocecal valve, in the ascending colon, and at the hepatic flexure.  Retroflexed views revealed no abnormalities. The time to cecum = 3.1 Withdrawal time = 11.7   The scope was withdrawn and the procedure completed. COMPLICATIONS: There were no immediate complications.  ENDOSCOPIC IMPRESSION: 1.   Sessile polyp at the cecum; polypectomy performed with a cold snare 2.   Moderate diverticulosis in the sigmoid colon 3.   The colonic mucosa otherwise appeared normal  RECOMMENDATIONS: 1.  Await pathology results 2.  High fiber diet with liberal fluid intake. 3.  Repeat Colonoscopy in 3 years.  eSigned:  Ladene Artist, MD, [facility] 07/11/2015 9:08 AM

## 2015-07-11 NOTE — Progress Notes (Signed)
Report to PACU, RN, vss, BBS= Clear.  

## 2015-07-11 NOTE — Progress Notes (Signed)
No problems noted in the recovery room. maw 

## 2015-07-11 NOTE — Progress Notes (Signed)
Patient denies any allergies to eggs or soy. 

## 2015-07-12 ENCOUNTER — Telehealth: Payer: Self-pay

## 2015-07-12 NOTE — Telephone Encounter (Signed)
  Follow up Call-  Call back number 07/11/2015 11/23/2014  Post procedure Call Back phone  # [phone]  Permission to leave phone message Yes Yes     Patient questions:  Do you have a fever, pain , or abdominal swelling? No. Pain Score  0 *  Have you tolerated food without any problems? Yes.    Have you been able to return to your normal activities? Yes.    Do you have any questions about your discharge instructions: Diet   No. Medications  No. Follow up visit  No.  Do you have questions or concerns about your Care? No.  Actions: * If pain score is 4 or above: No action needed, pain <4.

## 2015-07-17 ENCOUNTER — Encounter: Payer: Self-pay | Admitting: Gastroenterology

## 2015-08-26 ENCOUNTER — Other Ambulatory Visit: Payer: Self-pay | Admitting: Family Medicine

## 2015-09-03 ENCOUNTER — Other Ambulatory Visit (INDEPENDENT_AMBULATORY_CARE_PROVIDER_SITE_OTHER): Payer: Medicare Other

## 2015-09-03 DIAGNOSIS — N400 Enlarged prostate without lower urinary tract symptoms: Secondary | ICD-10-CM | POA: Diagnosis not present

## 2015-09-03 DIAGNOSIS — E1310 Other specified diabetes mellitus with ketoacidosis without coma: Secondary | ICD-10-CM

## 2015-09-03 DIAGNOSIS — E1110 Type 2 diabetes mellitus with ketoacidosis without coma: Secondary | ICD-10-CM

## 2015-09-03 DIAGNOSIS — K219 Gastro-esophageal reflux disease without esophagitis: Secondary | ICD-10-CM

## 2015-09-03 DIAGNOSIS — E785 Hyperlipidemia, unspecified: Secondary | ICD-10-CM

## 2015-09-03 LAB — COMPREHENSIVE METABOLIC PANEL
ALT: 12 U/L (ref 0–53)
AST: 12 U/L (ref 0–37)
Albumin: 4.2 g/dL (ref 3.5–5.2)
Alkaline Phosphatase: 56 U/L (ref 39–117)
BILIRUBIN TOTAL: 0.4 mg/dL (ref 0.2–1.2)
BUN: 16 mg/dL (ref 6–23)
CALCIUM: 9 mg/dL (ref 8.4–10.5)
CHLORIDE: 106 meq/L (ref 96–112)
CO2: 26 mEq/L (ref 19–32)
CREATININE: 1.03 mg/dL (ref 0.40–1.50)
GFR: 74.12 mL/min (ref >60.00)
Glucose, Bld: 165 mg/dL — ABNORMAL HIGH (ref 70–99)
Potassium: 4.1 mEq/L (ref 3.5–5.1)
SODIUM: 139 meq/L (ref 135–145)
Total Protein: 6.8 g/dL (ref 6.0–8.3)

## 2015-09-03 LAB — CBC
HCT: 43.3 % (ref 39.0–52.0)
HEMOGLOBIN: 14.5 g/dL (ref 13.0–17.0)
MCHC: 33.5 g/dL (ref 30.0–36.0)
MCV: 88.1 fl (ref 78.0–100.0)
Platelets: 229 10*3/uL (ref 150.0–400.0)
RBC: 4.91 Mil/uL (ref 4.22–5.81)
RDW: 14 % (ref 11.5–15.5)
WBC: 8.9 10*3/uL (ref 4.0–10.5)

## 2015-09-03 LAB — LIPID PANEL
CHOLESTEROL: 161 mg/dL (ref 0–200)
HDL: 58.9 mg/dL (ref >39.00)
LDL Cholesterol: 86 mg/dL (ref 0–99)
NONHDL: 102.5
Total CHOL/HDL Ratio: 3
Triglycerides: 84 mg/dL (ref 0.0–149.0)
VLDL: 16.8 mg/dL (ref 0.0–40.0)

## 2015-09-03 LAB — TSH: TSH: 1.83 u[IU]/mL (ref 0.35–4.50)

## 2015-09-03 LAB — MICROALBUMIN / CREATININE URINE RATIO
CREATININE, U: 179.6 mg/dL
Microalb Creat Ratio: 0.4 mg/g (ref 0.0–30.0)

## 2015-09-03 LAB — HEMOGLOBIN A1C: Hgb A1c MFr Bld: 6.4 % (ref 4.6–6.5)

## 2015-09-04 ENCOUNTER — Other Ambulatory Visit: Payer: Self-pay | Admitting: Family Medicine

## 2015-09-05 ENCOUNTER — Other Ambulatory Visit: Payer: Medicare Other

## 2015-09-17 ENCOUNTER — Encounter: Payer: Self-pay | Admitting: Family Medicine

## 2015-09-17 ENCOUNTER — Ambulatory Visit (INDEPENDENT_AMBULATORY_CARE_PROVIDER_SITE_OTHER): Payer: Medicare Other | Admitting: Family Medicine

## 2015-09-17 VITALS — BP 110/62 | HR 63 | Temp 97.3°F | Ht 72.0 in | Wt 179.4 lb

## 2015-09-17 DIAGNOSIS — E119 Type 2 diabetes mellitus without complications: Secondary | ICD-10-CM | POA: Diagnosis not present

## 2015-09-17 DIAGNOSIS — B351 Tinea unguium: Secondary | ICD-10-CM

## 2015-09-17 DIAGNOSIS — N400 Enlarged prostate without lower urinary tract symptoms: Secondary | ICD-10-CM

## 2015-09-17 DIAGNOSIS — H409 Unspecified glaucoma: Secondary | ICD-10-CM | POA: Diagnosis not present

## 2015-09-17 DIAGNOSIS — K219 Gastro-esophageal reflux disease without esophagitis: Secondary | ICD-10-CM

## 2015-09-17 DIAGNOSIS — E785 Hyperlipidemia, unspecified: Secondary | ICD-10-CM

## 2015-09-17 HISTORY — DX: Tinea unguium: B35.1

## 2015-09-17 NOTE — Assessment & Plan Note (Signed)
Consider soaking feet in distilled white vinegar and hot water nightly then apply vick's vapor rub on antifungal cream.

## 2015-09-17 NOTE — Assessment & Plan Note (Signed)
In December had 5th laser treatment after a respite of roughly, sees Dr Venetia Maxon quarterly due to Glaucoma, is doing well on 3 drops, no recent visual changes

## 2015-09-17 NOTE — Assessment & Plan Note (Signed)
hgba1c acceptable, minimize simple carbs. Increase exercise as tolerated. Continue current meds 

## 2015-09-17 NOTE — Assessment & Plan Note (Signed)
Tolerating statin, encouraged heart healthy diet, avoid trans fats, minimize simple carbs and saturated fats. Increase exercise as tolerated 

## 2015-09-17 NOTE — Assessment & Plan Note (Addendum)
Doing well, no changes,  Continue Rantitidine qhs

## 2015-09-17 NOTE — Patient Instructions (Addendum)
Soak feet in half distilled vinegar and half hot water Nail Ringworm A fungal infection of the nail (tinea unguium/onychomycosis) is common. It is common as the visible part of the nail is composed of dead cells which have no blood supply to help prevent infection. It occurs because fungi are everywhere and will pick any opportunity to grow on any dead material. Because nails are very slow growing they require up to 2 years of treatment with anti-fungal medications. The entire nail back to the base is infected. This includes approximately  of the nail which you cannot see. If your caregiver has prescribed a medication by mouth, take it every day and as directed. No progress will be seen for at least 6 to 9 months. Do not be disappointed! Because fungi live on dead cells with little or no exposure to blood supply, medication delivery to the infection is slow; thus the cure is slow. It is also why you can observe no progress in the first 6 months. The nail becoming cured is the base of the nail, as it has the blood supply. Topical medication such as creams and ointments are usually not effective. Important in successful treatment of nail fungus is closely following the medication regimen that your doctor prescribes. Sometimes you and your caregiver may elect to speed up this process by surgical removal of all the nails. Even this may still require 6 to 9 months of additional oral medications. See your caregiver as directed. Remember there will be no visible improvement for at least 6 months. See your caregiver sooner if other signs of infection (redness and swelling) develop.   This information is not intended to replace advice given to you by your health care provider. Make sure you discuss any questions you have with your health care provider.   Document Released: 06/20/2000 Document Revised: 11/07/2014 Document Reviewed: 12/25/2014 Elsevier Interactive Patient Education [employer].

## 2015-09-17 NOTE — Progress Notes (Signed)
Pre visit review using our clinic review tool, if applicable. No additional management support is needed unless otherwise documented below in the visit note. 

## 2015-09-17 NOTE — Progress Notes (Signed)
Subjective:    Patient ID: [name], male    DO: 1935/01/05, 81 y.o.   MRN: [id number]  Chief Complaint  Patient presents with  . Follow-up    HPI Patient is in today for follow up with numerous concerns.  Patient going to Dr. Marylynn Pearson quarterly for eye exam for glaucoma, patient had fifth laser treatment.  Sugars range from 96-120s but highest typically 137. Feels well, no recent illness or acute concerns. Ranitidine used nightly helps his heartburn well. Denies CP/palp/SOB/HA/congestion/fevers/GI or GU c/o. Taking meds as prescribed     Past Medical History  Diagnosis Date  . Diabetes mellitus 2000    type 2  . Hyperlipidemia   . GERD (gastroesophageal reflux disease)   . Glaucoma     followed by Dr Lanell Matar at [facility]  . BPH (benign prostatic hypertrophy)   . Vasomotor rhinitis 05/02/2013  . History of colonic polyps 12/04/2014  . Diabetes mellitus type 2 in nonobese [facility]) 06/19/2009    Qualifier: Diagnosis of  By: Wynona Luna    . Onychomycosis 09/17/2015    Past Surgical History  Procedure Laterality Date  . Appendectomy  1943  . Eye surgery      laser surgery on left numerous times.     Family History  Problem Relation Age of Onset  . Cancer Sister     breast  . Heart disease Father     MI  . Diabetes Father   . Arthritis Brother     knees  . Heart disease Brother     a fib  . Colon cancer Paternal Uncle 88    Social History   Social History  . Marital Status: Married    Spouse Name: Stanton Kidney  . Number of Children: 0  . Years of Education: N/A   Occupational History  . semi retired Education administrator  .     Social History Main Topics  . Smoking status: Never Smoker   . Smokeless tobacco: Never Used  . Alcohol Use: 0.0 oz/week    0 Standard drinks or equivalent per week     Comment: once monthly "maybe"  . Drug Use: No  . Sexual Activity: Yes     Comment: lives with wife, travels frequently, no major dietary restrictions.  exercises regularly with aerobic, stretch and weights   Other Topics Concern  . Not on file   Social History Narrative    Outpatient Prescriptions Prior to Visit  Medication Sig Dispense Refill  . brimonidine-timolol (COMBIGAN) 0.2-0.5 % ophthalmic solution Place 1 drop into both eyes 2 (two) times daily.      . brinzolamide (AZOPT) 1 % ophthalmic suspension Place 1 drop into both eyes 3 (three) times daily.      Marland Kitchen glyBURIDE (DIABETA) 2.5 MG tablet Take 1 tablet (2.5 mg total) by mouth daily with breakfast. 90 tablet 1  . ipratropium (ATROVENT) 0.03 % nasal spray PLACE 2 SPRAYS INTO THE NOSE EVERY 12 HOURS 90 mL 1  . JANUVIA 100 MG tablet TAKE 1 TABLET DAILY 90 tablet 0  . metFORMIN (GLUCOPHAGE) 1000 MG tablet TAKE 1 TABLET TWICE A DAY AND ONE-HALF (1/2) TABLET EVERY NOON. 150 tablet 0  . ranitidine (ZANTAC) 150 MG tablet Take 1 tablet (150 mg total) by mouth daily. 90 tablet 3  . RESTASIS 0.05 % ophthalmic emulsion Place 1 drop into both eyes 2 (two) times daily.     . simvastatin (ZOCOR) 10 MG tablet TAKE 1 TABLET AT BEDTIME  90 tablet 0  . tamsulosin (FLOMAX) 0.4 MG CAPS capsule TAKE 1 CAPSULE DAILY 90 capsule 1  . TRAVATAN Z 0.004 % SOLN ophthalmic solution Place 1 drop into both eyes at bedtime.     . travoprost, benzalkonium, (TRAVATAN) 0.004 % ophthalmic solution 1 drop at bedtime.      . valACYclovir (VALTREX) 1000 MG tablet Take 1 tablet (1,000 mg total) by mouth daily. 90 tablet 0  . vardenafil (LEVITRA) 20 MG tablet Take 1 tablet (20 mg total) by mouth daily as needed. 30 tablet 1   No facility-administered medications prior to visit.    Allergies  Allergen Reactions  . Levaquin [Levofloxacin In D5w]     hives    Review of Systems  Constitutional: Negative for fever and malaise/fatigue.  HENT: Negative for congestion.   Eyes: Negative for blurred vision.  Respiratory: Negative for shortness of breath.   Cardiovascular: Negative for chest pain, palpitations and leg  swelling.  Gastrointestinal: Negative for nausea, abdominal pain and blood in stool.  Genitourinary: Negative for dysuria and frequency.  Musculoskeletal: Negative for falls.  Skin: Negative for rash.  Neurological: Negative for dizziness, loss of consciousness and headaches.  Endo/Heme/Allergies: Negative for environmental allergies.  Psychiatric/Behavioral: Negative for depression. The patient is not nervous/anxious.        Objective:    Physical Exam  Constitutional: He is oriented to person, place, and time. He appears well-developed and well-nourished. No distress.  HENT:  Head: Normocephalic and atraumatic.  Eyes: Conjunctivae are normal.  Neck: Neck supple. No thyromegaly present.  Cardiovascular: Normal rate, regular rhythm and normal heart sounds.   No murmur heard. Pulmonary/Chest: Effort normal and breath sounds normal. No respiratory distress. He has no wheezes.  Abdominal: Soft. Bowel sounds are normal. He exhibits no mass. There is no tenderness.  Musculoskeletal: He exhibits no edema.  Lymphadenopathy:    He has no cervical adenopathy.  Neurological: He is alert and oriented to person, place, and time.  Skin: Skin is warm and dry.  Psychiatric: He has a normal mood and affect. His behavior is normal.    BP 110/62 mmHg  Pulse 63  Temp(Src) 97.3 F (36.3 C) (Oral)  Ht 6' (1.829 m)  Wt 179 lb 6 oz (81.364 kg)  BMI 24.32 kg/m2  SpO2 98% Wt Readings from Last 3 Encounters:  09/17/15 179 lb 6 oz (81.364 kg)  07/11/15 172 lb (78.019 kg)  06/27/15 172 lb 12.8 oz (78.382 kg)     Lab Results  Component Value Date   WBC 8.9 09/03/2015   HGB 14.5 09/03/2015   HCT 43.3 09/03/2015   PLT 229.0 09/03/2015   GLUCOSE 165* 09/03/2015   CHOL 161 09/03/2015   TRIG 84.0 09/03/2015   HDL 58.90 09/03/2015   LDLCALC 86 09/03/2015   ALT 12 09/03/2015   AST 12 09/03/2015   NA 139 09/03/2015   K 4.1 09/03/2015   CL 106 09/03/2015   CREATININE 1.03 09/03/2015   BUN 16  09/03/2015   CO2 26 09/03/2015   TSH 1.83 09/03/2015   PSA 2.90 05/28/2015   HGBA1C 6.4 09/03/2015   MICROALBUR <0.7 09/03/2015    Lab Results  Component Value Date   TSH 1.83 09/03/2015   Lab Results  Component Value Date   WBC 8.9 09/03/2015   HGB 14.5 09/03/2015   HCT 43.3 09/03/2015   MCV 88.1 09/03/2015   PLT 229.0 09/03/2015   Lab Results  Component Value Date   NA 139 09/03/2015  K 4.1 09/03/2015   CO2 26 09/03/2015   GLUCOSE 165* 09/03/2015   BUN 16 09/03/2015   CREATININE 1.03 09/03/2015   BILITOT 0.4 09/03/2015   ALKPHOS 56 09/03/2015   AST 12 09/03/2015   ALT 12 09/03/2015   PROT 6.8 09/03/2015   ALBUMIN 4.2 09/03/2015   CALCIUM 9.0 09/03/2015   GFR 74.12 09/03/2015   Lab Results  Component Value Date   CHOL 161 09/03/2015   Lab Results  Component Value Date   HDL 58.90 09/03/2015   Lab Results  Component Value Date   LDLCALC 86 09/03/2015   Lab Results  Component Value Date   TRIG 84.0 09/03/2015   Lab Results  Component Value Date   CHOLHDL 3 09/03/2015   Lab Results  Component Value Date   HGBA1C 6.4 09/03/2015       Assessment & Plan:   Problem List Items Addressed This Visit    BPH (benign prostatic hyperplasia)    Tamulosin working tolerably well, patient would consider Urolift in future if symptoms worsen      Relevant Orders   Lipid panel   TSH   CBC   Comprehensive metabolic panel   Diabetes mellitus type 2 in nonobese (HCC)    hgba1c acceptable, minimize simple carbs. Increase exercise as tolerated. Continue current meds.      Relevant Orders   Lipid panel   TSH   CBC   Comprehensive metabolic panel   GERD    Doing well, no changes,  Continue Rantitidine qhs      Relevant Orders   Lipid panel   TSH   CBC   Comprehensive metabolic panel   GLAUCOMA    In December had 5th laser treatment after a respite of roughly, sees Dr Venetia Maxon quarterly due to Glaucoma, is doing well on 3 drops, no recent visual  changes      Relevant Orders   Lipid panel   TSH   CBC   Comprehensive metabolic panel   Hyperlipidemia    Tolerating statin, encouraged heart healthy diet, avoid trans fats, minimize simple carbs and saturated fats. Increase exercise as tolerated      Relevant Orders   Lipid panel   TSH   CBC   Comprehensive metabolic panel   Onychomycosis - Primary    Consider soaking feet in distilled white vinegar and hot water nightly then apply vick's vapor rub on antifungal cream.         I am having Mr. Burgers maintain his brinzolamide, brimonidine-timolol, travoprost (benzalkonium), ranitidine, valACYclovir, tamsulosin, RESTASIS, TRAVATAN Z, vardenafil, glyBURIDE, ipratropium, metFORMIN, JANUVIA, and simvastatin.  No orders of the defined types were placed in this encounter.     Penni Homans, MD

## 2015-09-17 NOTE — Assessment & Plan Note (Signed)
Tamulosin working tolerably well, patient would consider Urolift in future if symptoms worsen

## 2015-10-09 ENCOUNTER — Encounter: Payer: Self-pay | Admitting: Family Medicine

## 2015-11-02 ENCOUNTER — Other Ambulatory Visit: Payer: Self-pay | Admitting: Family Medicine

## 2015-11-13 ENCOUNTER — Other Ambulatory Visit: Payer: Self-pay | Admitting: Family Medicine

## 2015-12-03 ENCOUNTER — Other Ambulatory Visit: Payer: Medicare Other

## 2015-12-07 ENCOUNTER — Ambulatory Visit: Payer: Medicare Other | Admitting: Family Medicine

## 2015-12-21 ENCOUNTER — Other Ambulatory Visit: Payer: Self-pay | Admitting: Family Medicine

## 2015-12-24 ENCOUNTER — Other Ambulatory Visit (INDEPENDENT_AMBULATORY_CARE_PROVIDER_SITE_OTHER): Payer: Medicare Other

## 2015-12-24 DIAGNOSIS — N400 Enlarged prostate without lower urinary tract symptoms: Secondary | ICD-10-CM

## 2015-12-24 DIAGNOSIS — E785 Hyperlipidemia, unspecified: Secondary | ICD-10-CM

## 2015-12-24 DIAGNOSIS — K219 Gastro-esophageal reflux disease without esophagitis: Secondary | ICD-10-CM

## 2015-12-24 DIAGNOSIS — H409 Unspecified glaucoma: Secondary | ICD-10-CM

## 2015-12-24 DIAGNOSIS — E119 Type 2 diabetes mellitus without complications: Secondary | ICD-10-CM

## 2015-12-24 LAB — COMPREHENSIVE METABOLIC PANEL
ALT: 11 U/L (ref 0–53)
AST: 11 U/L (ref 0–37)
Albumin: 4.1 g/dL (ref 3.5–5.2)
Alkaline Phosphatase: 56 U/L (ref 39–117)
BILIRUBIN TOTAL: 0.5 mg/dL (ref 0.2–1.2)
BUN: 19 mg/dL (ref 6–23)
CO2: 21 meq/L (ref 19–32)
Calcium: 9.3 mg/dL (ref 8.4–10.5)
Chloride: 106 mEq/L (ref 96–112)
Creatinine, Ser: 1.11 mg/dL (ref 0.40–1.50)
GFR: 67.93 mL/min (ref >60.00)
GLUCOSE: 129 mg/dL — AB (ref 70–99)
Potassium: 4 mEq/L (ref 3.5–5.1)
Sodium: 138 mEq/L (ref 135–145)
Total Protein: 6.8 g/dL (ref 6.0–8.3)

## 2015-12-24 LAB — CBC
HCT: 43.3 % (ref 39.0–52.0)
HEMOGLOBIN: 14.5 g/dL (ref 13.0–17.0)
MCHC: 33.4 g/dL (ref 30.0–36.0)
MCV: 88.1 fl (ref 78.0–100.0)
Platelets: 235 10*3/uL (ref 150.0–400.0)
RBC: 4.91 Mil/uL (ref 4.22–5.81)
RDW: 14.3 % (ref 11.5–15.5)
WBC: 8.4 10*3/uL (ref 4.0–10.5)

## 2015-12-24 LAB — LIPID PANEL
CHOL/HDL RATIO: 3
Cholesterol: 164 mg/dL (ref 0–200)
HDL: 52.8 mg/dL (ref >39.00)
LDL CALC: 90 mg/dL (ref 0–99)
NONHDL: 111.14
Triglycerides: 105 mg/dL (ref 0.0–149.0)
VLDL: 21 mg/dL (ref 0.0–40.0)

## 2015-12-24 LAB — TSH: TSH: 2.08 u[IU]/mL (ref 0.35–4.50)

## 2015-12-31 ENCOUNTER — Ambulatory Visit (INDEPENDENT_AMBULATORY_CARE_PROVIDER_SITE_OTHER): Payer: Medicare Other | Admitting: Family Medicine

## 2015-12-31 ENCOUNTER — Encounter: Payer: Self-pay | Admitting: Family Medicine

## 2015-12-31 VITALS — BP 120/72 | HR 67 | Temp 97.9°F | Ht 72.0 in | Wt 176.4 lb

## 2015-12-31 DIAGNOSIS — E119 Type 2 diabetes mellitus without complications: Secondary | ICD-10-CM

## 2015-12-31 DIAGNOSIS — E785 Hyperlipidemia, unspecified: Secondary | ICD-10-CM | POA: Diagnosis not present

## 2015-12-31 DIAGNOSIS — K219 Gastro-esophageal reflux disease without esophagitis: Secondary | ICD-10-CM

## 2015-12-31 DIAGNOSIS — J300 Vasomotor rhinitis: Secondary | ICD-10-CM

## 2015-12-31 DIAGNOSIS — B351 Tinea unguium: Secondary | ICD-10-CM

## 2015-12-31 DIAGNOSIS — R351 Nocturia: Secondary | ICD-10-CM

## 2015-12-31 DIAGNOSIS — N400 Enlarged prostate without lower urinary tract symptoms: Secondary | ICD-10-CM | POA: Diagnosis not present

## 2015-12-31 DIAGNOSIS — Z0000 Encounter for general adult medical examination without abnormal findings: Secondary | ICD-10-CM

## 2015-12-31 HISTORY — DX: Encounter for general adult medical examination without abnormal findings: Z00.00

## 2015-12-31 MED ORDER — IPRATROPIUM BROMIDE 0.03 % NA SOLN: 2.0000 | Freq: Two times a day (BID) | NASAL | Status: DC | PRN

## 2015-12-31 NOTE — Patient Instructions (Signed)

## 2015-12-31 NOTE — Assessment & Plan Note (Signed)
Consider daily vinegar soaks

## 2015-12-31 NOTE — Assessment & Plan Note (Signed)
Avoid offending foods, start probiotics. Do not eat large meals in late evening and consider raising head of bed.  

## 2015-12-31 NOTE — Assessment & Plan Note (Signed)
Uses Astelin prn as needed with good results.

## 2015-12-31 NOTE — Progress Notes (Signed)
Subjective:    Patient ID: [name], male    DOB: Dec 18, 1934, 81 y.o.   MRN: [id number]  Chief Complaint  Patient presents with  . Follow-up    HPI Patient is in today for follow up. He feels well today. He denies any recent illness or acute concerns. His FSG well below 150, this am 115. Denies polyuria or polydipsia. Denies CP/palp/SOB/HA/congestion/fevers/GI or GU c/o. Taking meds as prescribed  Past Medical History  Diagnosis Date  . Diabetes mellitus 2000    type 2  . Hyperlipidemia   . GERD (gastroesophageal reflux disease)   . Glaucoma     follow[facility] by Dr Lanell Matar at [facility]  . BPH (benign prostatic hypertrophy)   . Vasomotor rhinitis 05/02/2013  . History of colonic polyps 12/04/2014  . Diabetes mellitus type 2 in nonobese [facility]) 06/19/2009    Qualifier: Diagnosis of  By: Wynona Luna    . Onychomycosis 09/17/2015  . Medicare annual wellness visit, subsequent 12/31/2015    Past Surgical History  Procedure Laterality Date  . Appendectomy  1943  . Eye surgery      laser surgery on left numerous times.     Family History  Problem Relation Age of Onset  . Cancer Sister     breast  . Heart disease Father     MI  . Diabetes Father   . Arthritis Brother     knees  . Heart disease Brother     a fib  . Colon cancer Paternal Uncle 73    Social History   Social History  . Marital Status: Married    Spouse Name: Stanton Kidney  . Number of Children: 0  . Years of Education: N/A   Occupational History  . semi retired Education administrator  .     Social History Main Topics  . Smoking status: Never Smoker   . Smokeless tobacco: Never Used  . Alcohol Use: 0.0 oz/week    0 Standard drinks or equivalent per week     Comment: once monthly "maybe"  . Drug Use: No  . Sexual Activity: Yes     Comment: lives with wife, travels frequently, no major dietary restrictions. exercises regularly with aerobic, stretch and weights   Other Topics Concern  . Not on file    Social History Narrative    Outpatient Prescriptions Prior to Visit  Medication Sig Dispense Refill  . brimonidine-timolol (COMBIGAN) 0.2-0.5 % ophthalmic solution Place 1 drop into both eyes 2 (two) times daily.      . brinzolamide (AZOPT) 1 % ophthalmic suspension Place 1 drop into both eyes 3 (three) times daily.      Marland Kitchen glyBURIDE (DIABETA) 2.5 MG tablet TAKE 1 TABLET DAILY WITH BREAKFAST 90 tablet 0  . JANUVIA 100 MG tablet TAKE 1 TABLET DAILY 90 tablet 0  . metFORMIN (GLUCOPHAGE) 1000 MG tablet TAKE 1 TABLET TWICE A DAY AND ONE-HALF (1/2) TABLET EVERY NOON. 150 tablet 0  . ranitidine (ZANTAC) 150 MG tablet TAKE 1 TABLET DAILY 90 tablet 1  . RESTASIS 0.05 % ophthalmic emulsion Place 1 drop into both eyes 2 (two) times daily.     . simvastatin (ZOCOR) 10 MG tablet TAKE 1 TABLET AT BEDTIME 90 tablet 0  . tamsulosin (FLOMAX) 0.4 MG CAPS capsule TAKE 1 CAPSULE DAILY 90 capsule 1  . TRAVATAN Z 0.004 % SOLN ophthalmic solution Place 1 drop into both eyes at bedtime.     . travoprost, benzalkonium, (TRAVATAN) 0.004 % ophthalmic  solution 1 drop at bedtime.      . valACYclovir (VALTREX) 1000 MG tablet Take 1 tablet (1,000 mg total) by mouth daily. 90 tablet 0  . vardenafil (LEVITRA) 20 MG tablet Take 1 tablet (20 mg total) by mouth daily as needed. 30 tablet 1  . ipratropium (ATROVENT) 0.03 % nasal spray PLACE 2 SPRAYS INTO THE NOSE EVERY 12 HOURS 90 mL 1   No facility-administered medications prior to visit.    Allergies  Allergen Reactions  . Levaquin [Levofloxacin In D5w]     hives    Review of Systems  Constitutional: Negative for fever and malaise/fatigue.  HENT: Positive for congestion.   Eyes: Negative for blurred vision.  Respiratory: Negative for shortness of breath.   Cardiovascular: Negative for chest pain, palpitations and leg swelling.  Gastrointestinal: Negative for nausea, abdominal pain and blood in stool.  Genitourinary: Negative for dysuria and frequency.   Musculoskeletal: Negative for falls.  Skin: Negative for rash.  Neurological: Negative for dizziness, loss of consciousness and headaches.  Endo/Heme/Allergies: Negative for environmental allergies.  Psychiatric/Behavioral: Negative for depression. The patient is not nervous/anxious.        Objective:    Physical Exam  Constitutional: He is oriented to person, place, and time. He appears well-developed and well-nourished. No distress.  HENT:  Head: Normocephalic and atraumatic.  Eyes: Conjunctivae are normal.  Neck: Neck supple. No thyromegaly present.  Cardiovascular: Normal rate, regular rhythm and normal heart sounds.   No murmur heard. Pulmonary/Chest: Effort normal and breath sounds normal. No respiratory distress. He has no wheezes.  Abdominal: Soft. Bowel sounds are normal. He exhibits no mass. There is no tenderness.  Musculoskeletal: He exhibits no edema.  Lymphadenopathy:    He has no cervical adenopathy.  Neurological: He is alert and oriented to person, place, and time.  Skin: Skin is warm and dry.  Psychiatric: He has a normal mood and affect. His behavior is normal.    BP 120/72 mmHg  Pulse 67  Temp(Src) 97.9 F (36.6 C) (Oral)  Ht 6' (1.829 m)  Wt 176 lb 6 oz (80.003 kg)  BMI 23.92 kg/m2  SpO2 97% Wt Readings from Last 3 Encounters:  12/31/15 176 lb 6 oz (80.003 kg)  09/17/15 179 lb 6 oz (81.364 kg)  07/11/15 172 lb (78.019 kg)     Lab Results  Component Value Date   WBC 8.4 12/24/2015   HGB 14.5 12/24/2015   HCT 43.3 12/24/2015   PLT 235.0 12/24/2015   GLUCOSE 129* 12/24/2015   CHOL 164 12/24/2015   TRIG 105.0 12/24/2015   HDL 52.80 12/24/2015   LDLCALC 90 12/24/2015   ALT 11 12/24/2015   AST 11 12/24/2015   NA 138 12/24/2015   K 4.0 12/24/2015   CL 106 12/24/2015   CREATININE 1.11 12/24/2015   BUN 19 12/24/2015   CO2 21 12/24/2015   TSH 2.08 12/24/2015   PSA 2.90 05/28/2015   HGBA1C 6.4 09/03/2015   MICROALBUR <0.7 09/03/2015     Lab Results  Component Value Date   TSH 2.08 12/24/2015   Lab Results  Component Value Date   WBC 8.4 12/24/2015   HGB 14.5 12/24/2015   HCT 43.3 12/24/2015   MCV 88.1 12/24/2015   PLT 235.0 12/24/2015   Lab Results  Component Value Date   NA 138 12/24/2015   K 4.0 12/24/2015   CO2 21 12/24/2015   GLUCOSE 129* 12/24/2015   BUN 19 12/24/2015   CREATININE 1.11 12/24/2015  BILITOT 0.5 12/24/2015   ALKPHOS 56 12/24/2015   AST 11 12/24/2015   ALT 11 12/24/2015   PROT 6.8 12/24/2015   ALBUMIN 4.1 12/24/2015   CALCIUM 9.3 12/24/2015   GFR 67.93 12/24/2015   Lab Results  Component Value Date   CHOL 164 12/24/2015   Lab Results  Component Value Date   HDL 52.80 12/24/2015   Lab Results  Component Value Date   LDLCALC 90 12/24/2015   Lab Results  Component Value Date   TRIG 105.0 12/24/2015   Lab Results  Component Value Date   CHOLHDL 3 12/24/2015   Lab Results  Component Value Date   HGBA1C 6.4 09/03/2015       Assessment & Plan:   Problem List Items Addressed This Visit    Vasomotor rhinitis    Uses Astelin prn as needed with good results.      Onychomycosis    Consider daily vinegar soaks      Relevant Orders   CBC   TSH   Hemoglobin A1c   PSA, Medicare   Lipid panel   Comprehensive metabolic panel   Microalbumin / creatinine urine ratio   Medicare annual wellness visit, subsequent   Relevant Orders   CBC   TSH   Hemoglobin A1c   PSA, Medicare   Lipid panel   Comprehensive metabolic panel   Microalbumin / creatinine urine ratio   Hyperlipidemia    Tolerating statin, encouraged heart healthy diet, avoid trans fats, minimize simple carbs and saturated fats. Increase exercise as tolerated      Relevant Orders   CBC   TSH   Hemoglobin A1c   PSA, Medicare   Lipid panel   Comprehensive metabolic panel   Microalbumin / creatinine urine ratio   GERD    Avoid offending foods, start probiotics. Do not eat large meals in late  evening and consider raising head of bed.       Relevant Orders   CBC   TSH   Hemoglobin A1c   PSA, Medicare   Lipid panel   Comprehensive metabolic panel   Microalbumin / creatinine urine ratio   Diabetes mellitus type 2 in nonobese (HCC)    hgba1c acceptable, minimize simple carbs. Increase exercise as tolerated. Continue current meds, recent fsg well controlled      Relevant Orders   CBC   TSH   Hemoglobin A1c   PSA, Medicare   Lipid panel   Comprehensive metabolic panel   Microalbumin / creatinine urine ratio   BPH (benign prostatic hyperplasia) - Primary   Relevant Orders   CBC   TSH   Hemoglobin A1c   PSA, Medicare   Lipid panel   Comprehensive metabolic panel   Microalbumin / creatinine urine ratio    Other Visit Diagnoses    Nocturia        Relevant Orders    PSA, Medicare       I have changed Mr. Zeiser's ipratropium. I am also having him maintain his brinzolamide, brimonidine-timolol, travoprost (benzalkonium), valACYclovir, RESTASIS, TRAVATAN Z, vardenafil, tamsulosin, ranitidine, JANUVIA, metFORMIN, glyBURIDE, and simvastatin.  Meds ordered this encounter  Medications  . ipratropium (ATROVENT) 0.03 % nasal spray    Sig: Place 2 sprays into both nostrils 2 (two) times daily as needed for rhinitis.    Dispense:  90 mL    Refill:  1     BLYTH, STACEY, MD

## 2015-12-31 NOTE — Assessment & Plan Note (Signed)
hgba1c acceptable, minimize simple carbs. Increase exercise as tolerated. Continue current meds, recent fsg well controlled

## 2015-12-31 NOTE — Assessment & Plan Note (Signed)
Tolerating statin, encouraged heart healthy diet, avoid trans fats, minimize simple carbs and saturated fats. Increase exercise as tolerated 

## 2016-01-28 ENCOUNTER — Other Ambulatory Visit: Payer: Self-pay | Admitting: Family Medicine

## 2016-03-02 ENCOUNTER — Other Ambulatory Visit: Payer: Self-pay | Admitting: Family Medicine

## 2016-03-17 ENCOUNTER — Other Ambulatory Visit: Payer: Self-pay | Admitting: Family Medicine

## 2016-04-03 NOTE — Progress Notes (Signed)
Pre visit review using our clinic review tool, if applicable. No additional management support is needed unless otherwise documented below in the visit note. 

## 2016-04-03 NOTE — Progress Notes (Addendum)
Subjective:   [name] is a 81 y.o. male who presents for an Initial Medicare Annual Wellness Visit.  Review of Systems  No ROS.  Medicare Wellness Visit.  Cardiac Risk Factors include: advanced age (>64men, >46 women);diabetes mellitus;dyslipidemia;hypertension;male gender  Sleep patterns: Sleeps 6-7 hrs nightly. Sleep interrupted by getting up 2-3x nightly to void, although pt states sometimes he does not have to get up at all. Feels rested on waking   Home Safety/Smoke Alarms: Lives in home w/ wife. Feels safe in home. Smoke alarms present. Living environment; residence and Firearm Safety: Stored in a safe place.  Seat Belt Safety/Bike Helmet: Wears seat belt.   Counseling:   Eye Exam- Previously saw Dr. Marylynn Pearson, but has appt w/ Dr. Deloria Lair next week for diabetic eye exam. Dental- Does not see dentist regularly, but last saw about 6 months ago. Does not wear dentures, no dental issues reported.  Male:   [facility]- 07/11/15 w/ Dr. Lucio Edward; sessile polyp at the cecum; polypectomy performed with a cold snare, moderate diverticulosis in the sigmoid colon. 3 year recall. PSA-  Lab Results  Component Value Date   PSA 2.90 05/28/2015   PSA 2.35 08/04/2013   PSA 2.27 04/02/2012         Objective:    Today's Vitals   04/04/16 0859  BP: (!) 142/70  Pulse: 73  Resp: 18  SpO2: 99%  Weight: 175 lb (79.4 kg)  Height: 6' 0.5" (1.842 m)   Body mass index is 23.41 kg/m.  Current Medications (verified) Outpatient Encounter Prescriptions as of 04/04/2016  Medication Sig  . Brinzolamide-Brimonidine [facility] OP) Place into both eyes 3 (three) times daily.  Marland Kitchen glyBURIDE (DIABETA) 2.5 MG tablet TAKE 1 TABLET DAILY WITH BREAKFAST  . ipratropium (ATROVENT) 0.03 % nasal spray Place 2 sprays into both nostrils 2 (two) times daily as needed for rhinitis.  Marland Kitchen JANUVIA 100 MG tablet TAKE 1 TABLET DAILY  . metFORMIN (GLUCOPHAGE) 1000 MG tablet TAKE 1 TABLET TWICE A DAY AND  ONE-HALF (1/2) TABLET EVERY NOON.  . ranitidine (ZANTAC) 150 MG tablet TAKE 1 TABLET DAILY  . RESTASIS 0.05 % ophthalmic emulsion Place 1 drop into both eyes 2 (two) times daily.   . simvastatin (ZOCOR) 10 MG tablet TAKE 1 TABLET AT BEDTIME  . tamsulosin (FLOMAX) 0.4 MG CAPS capsule TAKE 1 CAPSULE DAILY  . TRAVATAN Z 0.004 % SOLN ophthalmic solution Place 1 drop into both eyes at bedtime.   . travoprost, benzalkonium, (TRAVATAN) 0.004 % ophthalmic solution 1 drop at bedtime.    . valACYclovir (VALTREX) 1000 MG tablet Take 1 tablet (1,000 mg total) by mouth daily.  . vardenafil (LEVITRA) 20 MG tablet Take 1 tablet (20 mg total) by mouth daily as needed.  . [DISCONTINUED] brimonidine-timolol (COMBIGAN) 0.2-0.5 % ophthalmic solution Place 1 drop into both eyes 2 (two) times daily.    . [DISCONTINUED] brinzolamide (AZOPT) 1 % ophthalmic suspension Place 1 drop into both eyes 3 (three) times daily.     No facility-administered encounter medications on file as of 04/04/2016.     Allergies (verified) Levaquin [levofloxacin in d5w]   History: Past Medical History:  Diagnosis Date  . BPH (benign prostatic hypertrophy)   . Diabetes mellitus 2000   type 2  . Diabetes mellitus type 2 in nonobese [facility]) 06/19/2009   Qualifier: Diagnosis of  By: Wynona Luna    . GERD (gastroesophageal reflux disease)   . Glaucoma    followed by  Dr Lanell Matar at [facility]  . History of colonic polyps 12/04/2014  . Hyperlipidemia   . Medicare annual wellness visit, subsequent 12/31/2015  . Onychomycosis 09/17/2015  . Vasomotor rhinitis 05/02/2013   Past Surgical History:  Procedure Laterality Date  . APPENDECTOMY  1943  . EYE SURGERY     laser surgery on left numerous times.    Family History  Problem Relation Age of Onset  . Cancer Sister     breast  . Heart disease Father     MI  . Diabetes Father   . Arthritis Brother     knees  . Heart disease Brother     a fib  . Colon cancer Paternal  Uncle 66   Social History   Occupational History  . semi retired Education administrator  .  Retired   Social History Main Topics  . Smoking status: Never Smoker  . Smokeless tobacco: Never Used  . Alcohol use 0.0 oz/week     Comment: once monthly "maybe"  . Drug use: No  . Sexual activity: Yes     Comment: lives with wife, travels frequently, no major dietary restrictions. exercises regularly with aerobic, stretch and weights   Tobacco Counseling Counseling given: Not Answered   Activities of Daily Living In your present state of health, do you have any difficulty performing the following activities: 04/04/2016 12/31/2015  Hearing? N Y  Vision? N N  Difficulty concentrating or making decisions? N N  Walking or climbing stairs? N N  Dressing or bathing? N N  Doing errands, shopping? N N  Preparing Food and eating ? N -  Using the Toilet? N -  In the past six months, have you accidently leaked urine? N -  Do you have problems with loss of bowel control? N -  Managing your Medications? N -  Managing your Finances? N -  Housekeeping or managing your Housekeeping? N -  Some recent data might be hidden    Immunizations and Health Maintenance Immunization History  Administered Date(s) Administered  . Influenza Split 05/20/2011  . Influenza Whole 04/24/2009, 04/16/2010  . Influenza, High Dose Seasonal PF 05/02/2013  . Influenza,inj,Quad PF,36+ Mos 06/04/2015  . Influenza-Unspecified 05/05/2014  . Pneumococcal Conjugate-13 06/04/2015  . Pneumococcal Polysaccharide-23 03/20/2009  . Td 11/16/2007  . Zoster 03/27/2009   Health Maintenance Due  Topic Date Due  . FOOT EXAM  01/20/1945  . OPHTHALMOLOGY EXAM  08/24/2012  . HEMOGLOBIN A1C  03/02/2016    Patient Care Team: Mosie Lukes, MD as PCP - General (Family Medicine) Hurman Horn, MD as Consulting Physician (Ophthalmology)  Indicate any recent Medical Services you may have received from other than [facility] providers in the past  year (date may be approximate).    Assessment:   This is a routine wellness examination for [name]. Physical assessment deferred to PCP.  Hearing/Vision screen Hearing Screening Comments: Able to hear conversational tones w/o difficulty. No issues reported. Pt not interested in hearing screening. Vision Screening Comments: Previously followed by Dr. Marylynn Pearson. Has upcoming appt w/ Dr. Deloria Lair. Wearing glasses, no issues reported. Pt has glaucoma, states he will get referral from Dr. Zadie Rhine for new glaucoma specialist.  Dietary issues and exercise activities discussed: Current Exercise Habits: Home exercise routine, Type of exercise: stretching;walking, Intensity: Moderate  Diet (meal preparation, eat out, water intake, caffeinated beverages, dairy products, fruits and vegetables): Eats 3 meals daily, tries to eat well-balanced diet. Wife does most of the cooking but pt is able  to prepare meals. Drinks water throughout the day, tries to decrease fluid intake a few hours prior to bedtime. Drinks cup of coffee in the mornings.  Pt concerns:  1) Pt feels that BPH symptoms have worsened since last visit. Endorses worsening urinary frequency and urgency. Denies dribbling/urinary incontinence. He would like to proceed w/ urology referral to be evaluated for Uro-lift procedure, but is not sure where he should go. Discussed w/ Dr. Charlett Blake, and I will contact [facility] to see if they have providers experienced w/ this procedure and refer appropriately.   2) Pt stated several times, "This is a waste of my time" regarding AWV and stated that he will not be having another wellness visit in the future. Pt was offered the opportunity to reschedule AWV w/ PCP, but declined. Throughout appt, pt sat slouched in chair w/ arms folded and stated several times, 'just do what you need to do and let's get this over with.' He refused flu shot, Hgb A1c, and MMS exam today. Also expressed dissatisfaction with his  overall care from this office, stating that he has not had prostate exam in many years and PCP 'has no idea if my prostate is the size of a walnut or a basketball' and 'what kind of care is that?' I listened to pt and offered to have PCP include DRE w/ next OV and even schedule sooner visit for prostate exam, but pt declined.    3) BP borderline today. Pt feels likely emotional cause due to his frustration about the appt today. Feel this is reasonable given no h/o HTN or elevated BP. Follow-up w/ PCP as scheduled. BP Readings from Last 3 Encounters:  04/04/16 (!) 142/70  12/31/15 120/72  09/17/15 110/62        Goals    . Healthy Lifestyle          Continue to eat heart healthy diet (full of fruits, vegetables, whole grains, lean protein, water--limit salt, fat, and sugar intake) and increase physical activity as tolerated. Continue doing brain stimulating activities (puzzles, reading, adult coloring books, staying active) to keep memory sharp.        Depression Screen PHQ 2/9 Scores 04/04/2016 12/31/2015 11/27/2014  PHQ - 2 Score 0 0 0    Fall Risk Fall Risk  04/04/2016 12/31/2015 11/27/2014  Falls in the past year? No No No    Cognitive Function: MMSE - Mini Mental State Exam 04/04/2016  Not completed: Refused    Screening Tests Health Maintenance  Topic Date Due  . FOOT EXAM  01/20/1945  . OPHTHALMOLOGY EXAM  08/24/2012  . HEMOGLOBIN A1C  03/02/2016  . INFLUENZA VACCINE  10/04/2016 (Originally 02/05/2016)  . URINE MICROALBUMIN  09/02/2016  . TETANUS/TDAP  11/15/2017  . COLONOSCOPY  07/10/2018  . ZOSTAVAX  Completed  . PNA vac Low Risk Adult  Completed        Plan:   Follow-up with Dr. Charlett Blake as scheduled.  Urology referral pending research of local provider who is experienced w/ Uro-lift procedure.  During the course of the visit Demari was educated and counseled about the following appropriate screening and preventive services:   Vaccines to include Pneumoccal,  Influenza, Hepatitis B, Td, Zostavax, HCV  Colorectal cancer screening  Cardiovascular disease screening  Diabetes screening  Glaucoma screening  Nutrition counseling  Prostate cancer screening  Pt declined AVS.    Dorrene German, RN   04/04/2016   RN AWV note reviewed. Agree with documention and plan. Penni Homans, MD

## 2016-04-04 ENCOUNTER — Encounter: Payer: Self-pay | Admitting: Family Medicine

## 2016-04-04 ENCOUNTER — Encounter: Payer: Self-pay | Admitting: *Deleted

## 2016-04-04 ENCOUNTER — Other Ambulatory Visit: Payer: Self-pay | Admitting: *Deleted

## 2016-04-04 ENCOUNTER — Ambulatory Visit (INDEPENDENT_AMBULATORY_CARE_PROVIDER_SITE_OTHER): Payer: Medicare Other | Admitting: *Deleted

## 2016-04-04 VITALS — BP 142/70 | HR 73 | Resp 18 | Ht 72.5 in | Wt 175.0 lb

## 2016-04-04 DIAGNOSIS — E119 Type 2 diabetes mellitus without complications: Secondary | ICD-10-CM

## 2016-04-04 DIAGNOSIS — Z0000 Encounter for general adult medical examination without abnormal findings: Secondary | ICD-10-CM

## 2016-04-04 DIAGNOSIS — N400 Enlarged prostate without lower urinary tract symptoms: Secondary | ICD-10-CM | POA: Diagnosis not present

## 2016-04-04 NOTE — Assessment & Plan Note (Signed)
Pt reports compliance w/ medication. Declines A1c today.   Lab Results  Component Value Date   HGBA1C 6.4 09/03/2015

## 2016-04-04 NOTE — Patient Instructions (Signed)
Follow-up with Dr. Charlett Blake as scheduled.

## 2016-04-04 NOTE — Assessment & Plan Note (Signed)
Pt reports worsening urinary urgency/frequency. Would like to pursue urology workup for possible Uro-lift procedure. Will research local providers who do procedure and refer pt appropriately, verbal okay per Dr. Charlett Blake.

## 2016-04-07 LAB — HM DIABETES EYE EXAM

## 2016-04-16 ENCOUNTER — Other Ambulatory Visit: Payer: Self-pay | Admitting: Family Medicine

## 2016-04-28 ENCOUNTER — Encounter: Payer: Self-pay | Admitting: Family Medicine

## 2016-05-02 ENCOUNTER — Other Ambulatory Visit: Payer: Self-pay | Admitting: Family Medicine

## 2016-05-11 ENCOUNTER — Other Ambulatory Visit: Payer: Self-pay | Admitting: Family Medicine

## 2016-05-30 ENCOUNTER — Other Ambulatory Visit: Payer: Self-pay | Admitting: Family Medicine

## 2016-06-06 ENCOUNTER — Other Ambulatory Visit (INDEPENDENT_AMBULATORY_CARE_PROVIDER_SITE_OTHER): Payer: Medicare Other

## 2016-06-06 DIAGNOSIS — B351 Tinea unguium: Secondary | ICD-10-CM

## 2016-06-06 DIAGNOSIS — E119 Type 2 diabetes mellitus without complications: Secondary | ICD-10-CM | POA: Diagnosis not present

## 2016-06-06 DIAGNOSIS — K219 Gastro-esophageal reflux disease without esophagitis: Secondary | ICD-10-CM | POA: Diagnosis not present

## 2016-06-06 DIAGNOSIS — E785 Hyperlipidemia, unspecified: Secondary | ICD-10-CM | POA: Diagnosis not present

## 2016-06-06 DIAGNOSIS — Z0000 Encounter for general adult medical examination without abnormal findings: Secondary | ICD-10-CM | POA: Diagnosis not present

## 2016-06-06 DIAGNOSIS — R351 Nocturia: Secondary | ICD-10-CM

## 2016-06-06 DIAGNOSIS — N400 Enlarged prostate without lower urinary tract symptoms: Secondary | ICD-10-CM | POA: Diagnosis not present

## 2016-06-06 LAB — COMPREHENSIVE METABOLIC PANEL
ALK PHOS: 57 U/L (ref 39–117)
ALT: 11 U/L (ref 0–53)
AST: 9 U/L (ref 0–37)
Albumin: 4 g/dL (ref 3.5–5.2)
BILIRUBIN TOTAL: 0.4 mg/dL (ref 0.2–1.2)
BUN: 20 mg/dL (ref 6–23)
CO2: 23 mEq/L (ref 19–32)
CREATININE: 1.22 mg/dL (ref 0.40–1.50)
Calcium: 8.9 mg/dL (ref 8.4–10.5)
Chloride: 108 mEq/L (ref 96–112)
GFR: 60.84 mL/min (ref >60.00)
GLUCOSE: 148 mg/dL — AB (ref 70–99)
Potassium: 3.9 mEq/L (ref 3.5–5.1)
Sodium: 139 mEq/L (ref 135–145)
TOTAL PROTEIN: 6.2 g/dL (ref 6.0–8.3)

## 2016-06-06 LAB — CBC
HCT: 41.5 % (ref 39.0–52.0)
Hemoglobin: 14.1 g/dL (ref 13.0–17.0)
MCHC: 33.9 g/dL (ref 30.0–36.0)
MCV: 88.3 fl (ref 78.0–100.0)
Platelets: 229 10*3/uL (ref 150.0–400.0)
RBC: 4.71 Mil/uL (ref 4.22–5.81)
RDW: 14.6 % (ref 11.5–15.5)
WBC: 8.2 10*3/uL (ref 4.0–10.5)

## 2016-06-06 LAB — LIPID PANEL
Cholesterol: 154 mg/dL (ref 0–200)
HDL: 47.9 mg/dL (ref >39.00)
LDL Cholesterol: 86 mg/dL (ref 0–99)
NONHDL: 105.82
TRIGLYCERIDES: 100 mg/dL (ref 0.0–149.0)
Total CHOL/HDL Ratio: 3
VLDL: 20 mg/dL (ref 0.0–40.0)

## 2016-06-06 LAB — MICROALBUMIN / CREATININE URINE RATIO
Creatinine,U: 159.9 mg/dL
MICROALB/CREAT RATIO: 0.4 mg/g (ref 0.0–30.0)
Microalb, Ur: <0.7 mg/dL (ref 0.0–1.9)

## 2016-06-06 LAB — TSH: TSH: 2.02 u[IU]/mL (ref 0.35–4.50)

## 2016-06-06 LAB — HEMOGLOBIN A1C: HEMOGLOBIN A1C: 6.3 % (ref 4.6–6.5)

## 2016-06-06 LAB — PSA, MEDICARE: PSA: 3.06 ng/mL (ref 0.10–4.00)

## 2016-06-09 ENCOUNTER — Ambulatory Visit (INDEPENDENT_AMBULATORY_CARE_PROVIDER_SITE_OTHER): Payer: Medicare Other | Admitting: Family Medicine

## 2016-06-09 ENCOUNTER — Other Ambulatory Visit: Payer: Medicare Other

## 2016-06-09 ENCOUNTER — Encounter: Payer: Self-pay | Admitting: Family Medicine

## 2016-06-09 VITALS — BP 112/68 | HR 71 | Temp 97.7°F | Ht 73.0 in | Wt 169.2 lb

## 2016-06-09 DIAGNOSIS — Z0000 Encounter for general adult medical examination without abnormal findings: Secondary | ICD-10-CM

## 2016-06-09 DIAGNOSIS — E119 Type 2 diabetes mellitus without complications: Secondary | ICD-10-CM

## 2016-06-09 DIAGNOSIS — E782 Mixed hyperlipidemia: Secondary | ICD-10-CM | POA: Diagnosis not present

## 2016-06-09 DIAGNOSIS — H409 Unspecified glaucoma: Secondary | ICD-10-CM | POA: Diagnosis not present

## 2016-06-09 DIAGNOSIS — R350 Frequency of micturition: Secondary | ICD-10-CM

## 2016-06-09 DIAGNOSIS — N401 Enlarged prostate with lower urinary tract symptoms: Secondary | ICD-10-CM

## 2016-06-09 LAB — URINALYSIS
BILIRUBIN URINE: NEGATIVE
Hgb urine dipstick: NEGATIVE
LEUKOCYTES UA: NEGATIVE
NITRITE: NEGATIVE
PH: 6.5 (ref 5.0–8.0)
SPECIFIC GRAVITY, URINE: 1.02 (ref 1.000–1.030)
TOTAL PROTEIN, URINE-UPE24: NEGATIVE
Urine Glucose: NEGATIVE
Urobilinogen, UA: 0.2 (ref 0.0–1.0)

## 2016-06-09 MED ORDER — GLUCOSE BLOOD VI STRP: ORAL_STRIP | 6 refills | Status: DC

## 2016-06-09 MED ORDER — WALGREENS LANCETS MISC
6 refills | Status: AC
Start: 2016-06-09 — End: ?

## 2016-06-09 NOTE — Assessment & Plan Note (Signed)
Encouraged heart healthy diet, increase exercise, avoid trans fats, consider a krill oil cap daily 

## 2016-06-09 NOTE — Assessment & Plan Note (Signed)
hgba1c acceptable, minimize simple carbs. Increase exercise as tolerated. Continue current meds 

## 2016-06-09 NOTE — Patient Instructions (Signed)
Preventive Care 79 Years and Older, Male Preventive care refers to lifestyle choices and visits with your health care provider that can promote health and wellness. What does preventive care include?  A yearly physical exam. This is also called an annual well check.  Dental exams once or twice a year.  Routine eye exams. Ask your health care provider how often you should have your eyes checked.  Personal lifestyle choices, including:  Daily care of your teeth and gums.  Regular physical activity.  Eating a healthy diet.  Avoiding tobacco and drug use.  Limiting alcohol use.  Practicing safe sex.  Taking low doses of aspirin every day.  Taking vitamin and mineral supplements as recommended by your health care provider. What happens during an annual well check? The services and screenings done by your health care provider during your annual well check will depend on your age, overall health, lifestyle risk factors, and family history of disease. Counseling  Your health care provider may ask you questions about your:  Alcohol use.  Tobacco use.  Drug use.  Emotional well-being.  Home and relationship well-being.  Sexual activity.  Eating habits.  History of falls.  Memory and ability to understand (cognition).  Work and work environment. Screening  You may have the following tests or measurements:  Height, weight, and BMI.  Blood pressure.  Lipid and cholesterol levels. These may be checked every 5 years, or more frequently if you are over 79 years old.  Skin check.  Lung cancer screening. You may have this screening every year starting at age 79 if you have a 30-pack-year history of smoking and currently smoke or have quit within the past 15 years.  Fecal occult blood test (FOBT) of the stool. You may have this test every year starting at age 79.  Flexible sigmoidoscopy or colonoscopy. You may have a sigmoidoscopy every 5 years or a colonoscopy every 10  years starting at age 79.  Prostate cancer screening. Recommendations will vary depending on your family history and other risks.  Hepatitis C blood test.  Hepatitis B blood test.  Sexually transmitted disease (STD) testing.  Diabetes screening. This is done by checking your blood sugar (glucose) after you have not eaten for a while (fasting). You may have this done every 1-3 years.  Abdominal aortic aneurysm (AAA) screening. You may need this if you are a current or former smoker.  Osteoporosis. You may be screened starting at age 79 if you are at high risk. Talk with your health care provider about your test results, treatment options, and if necessary, the need for more tests. Vaccines  Your health care provider may recommend certain vaccines, such as:  Influenza vaccine. This is recommended every year.  Tetanus, diphtheria, and acellular pertussis (Tdap, Td) vaccine. You may need a Td booster every 10 years.  Varicella vaccine. You may need this if you have not been vaccinated.  Zoster vaccine. You may need this after age 60.  Measles, mumps, and rubella (MMR) vaccine. You may need at least one dose of MMR if you were born in 1957 or later. You may also need a second dose.  Pneumococcal 13-valent conjugate (PCV13) vaccine. One dose is recommended after age 65.  Pneumococcal polysaccharide (PPSV23) vaccine. One dose is recommended after age 65.  Meningococcal vaccine. You may need this if you have certain conditions.  Hepatitis A vaccine. You may need this if you have certain conditions or if you travel or work in places where   you may be exposed to hepatitis A.  Hepatitis B vaccine. You may need this if you have certain conditions or if you travel or work in places where you may be exposed to hepatitis B.  Haemophilus influenzae type b (Hib) vaccine. You may need this if you have certain risk factors. Talk to your health care provider about which screenings and vaccines you  need and how often you need them. This information is not intended to replace advice given to you by your health care provider. Make sure you discuss any questions you have with your health care provider. Document Released: 07/20/2015 Document Revised: 03/12/2016 Document Reviewed: 04/24/2015 Elsevier Interactive Patient Education  2017 [employer].

## 2016-06-09 NOTE — Assessment & Plan Note (Signed)
Has established care with [facility] and is awaiting uroplasty. 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 They will have to wait til January after his eye surgeries are completed

## 2016-06-09 NOTE — Progress Notes (Signed)
Pre visit review using our clinic review tool, if applicable. No additional management support is needed unless otherwise documented below in the visit note. 

## 2016-06-09 NOTE — Progress Notes (Signed)
Patient ID: [name], male   DOB: [birth year], 81 y.o.   MRN: [id number]   Subjective:    Patient ID: [name], male    DOB: [birth year], 81 y.o.   MRN: [id number]  Chief Complaint  Patient presents with  . Medicare Wellness    HPI Patient is in today for annual medicare wellness exam. He is doing well. He has been following closely with a new opthamologist, Dr Zadie Rhine who has referred him to Dr Katy Fitch for surgery for cataract removal and glaucoma surgery. The eye in his left eye has recently spiked from 14 to 30. They want to relieve the pressure with surgery but they have also placed him on Diamox. At tid dosing he had hematuria but at bid dosing he is doing better. Denies CP/palp/SOB/HA/congestion/fevers/GI or GU c/o. Taking meds as prescribed. No polydipsia. Notes blood sugars in am ranging from 100 to 115 rountinely. He notes the Diamox has stolen his appetite resulting in weight loss. Encouraged to increase protein intake.  Past Medical History:  Diagnosis Date  . BPH (benign prostatic hypertrophy)   . Diabetes mellitus 2000   type 2  . Diabetes mellitus type 2 in nonobese [facility]) 06/19/2009   Qualifier: Diagnosis of  By: Wynona Luna    . GERD (gastroesophageal reflux disease)   . Glaucoma    followed by Dr Lanell Matar at [facility]  . History of colonic polyps 12/04/2014  . Hyperlipidemia   . Medicare annual wellness visit, subsequent 12/31/2015  . Onychomycosis 09/17/2015  . Vasomotor rhinitis 05/02/2013    Past Surgical History:  Procedure Laterality Date  . APPENDECTOMY  1943  . EYE SURGERY     laser surgery on left numerous times.     Family History  Problem Relation Age of Onset  . Cancer Sister     breast  . Obstructive Sleep Apnea Sister   . Heart disease Father     MI  . Diabetes Father   . Arthritis Brother     knees  . Heart disease Brother     a fib  . Colon cancer Paternal Uncle 57    Social History   Social History  . Marital  status: Married    Spouse name: Mary  . Number of children: 0  . Years of education: N/A   Occupational History  . semi retired Education administrator  .  Retired   Social History Main Topics  . Smoking status: Never Smoker  . Smokeless tobacco: Never Used  . Alcohol use 0.0 oz/week     Comment: once monthly "maybe"  . Drug use: No  . Sexual activity: Yes     Comment: lives with wife, travels frequently, no major dietary restrictions. exercises regularly with aerobic, stretch and weights   Other Topics Concern  . Not on file   Social History Narrative  . No narrative on file    Outpatient Medications Prior to Visit  Medication Sig Dispense Refill  . glyBURIDE (DIABETA) 2.5 MG tablet TAKE 1 TABLET DAILY WITH BREAKFAST 90 tablet 0  . ipratropium (ATROVENT) 0.03 % nasal spray Place 2 sprays into both nostrils 2 (two) times daily as needed for rhinitis. 90 mL 1  . JANUVIA 100 MG tablet TAKE 1 TABLET DAILY 90 tablet 0  . metFORMIN (GLUCOPHAGE) 1000 MG tablet TAKE 1 TABLET TWICE A DAY AND ONE-HALF (1/2) TABLET EVERY NOON. 150 tablet 0  . ranitidine (ZANTAC) 150 MG tablet TAKE 1 TABLET DAILY 90 tablet 1  .  RESTASIS 0.05 % ophthalmic emulsion Place 1 drop into both eyes 2 (two) times daily.     . simvastatin (ZOCOR) 10 MG tablet TAKE 1 TABLET AT BEDTIME 90 tablet 1  . tamsulosin (FLOMAX) 0.4 MG CAPS capsule TAKE 1 CAPSULE DAILY 90 capsule 1  . TRAVATAN Z 0.004 % SOLN ophthalmic solution Place 1 drop into both eyes at bedtime.     . travoprost, benzalkonium, (TRAVATAN) 0.004 % ophthalmic solution 1 drop at bedtime.      . valACYclovir (VALTREX) 1000 MG tablet Take 1 tablet (1,000 mg total) by mouth daily. 90 tablet 0  . vardenafil (LEVITRA) 20 MG tablet Take 1 tablet (20 mg total) by mouth daily as needed. 30 tablet 1  . Brinzolamide-Brimonidine [facility]) Place into both eyes 3 (three) times daily.     No facility-administered medications prior to visit.     Allergies  Allergen Reactions    . Levaquin [Levofloxacin In D5w] Hives    Review of Systems  Constitutional: Positive for weight loss. Negative for chills, fever and malaise/fatigue.  HENT: Negative for congestion and hearing loss.   Eyes: Negative for pain, discharge and redness.  Respiratory: Negative for cough, sputum production and shortness of breath.   Cardiovascular: Negative for chest pain, palpitations and leg swelling.  Gastrointestinal: Negative for abdominal pain, blood in stool, constipation, diarrhea, heartburn, nausea and vomiting.  Genitourinary: Positive for frequency, hematuria and urgency. Negative for dysuria.  Musculoskeletal: Negative for back pain, falls and myalgias.  Skin: Negative for rash.  Neurological: Negative for dizziness, sensory change, loss of consciousness, weakness and headaches.  Endo/Heme/Allergies: Negative for environmental allergies. Does not bruise/bleed easily.  Psychiatric/Behavioral: Negative for depression and suicidal ideas. The patient is not nervous/anxious and does not have insomnia.        Objective:    Physical Exam  Constitutional: He is oriented to person, place, and time. He appears well-developed and well-nourished. No distress.  HENT:  Head: Normocephalic and atraumatic.  Eyes: Conjunctivae are normal.  Neck: Neck supple. No thyromegaly present.  Cardiovascular: Normal rate, regular rhythm and normal heart sounds.   No murmur heard. Pulmonary/Chest: Effort normal and breath sounds normal. No respiratory distress. He has no wheezes.  Abdominal: Soft. Bowel sounds are normal. He exhibits no mass. There is no tenderness.  Musculoskeletal: He exhibits no edema.  Lymphadenopathy:    He has no cervical adenopathy.  Neurological: He is alert and oriented to person, place, and time.  Skin: Skin is warm and dry.  Psychiatric: He has a normal mood and affect. His behavior is normal.    BP 112/68 (BP Location: Left Arm, Patient Position: Sitting, Cuff Size:  Normal)   Pulse 71   Temp 97.7 F (36.5 C) (Oral)   Ht 6\' 1"  (1.854 m)   Wt 169 lb 4 oz (76.8 kg)   SpO2 98%   BMI 22.33 kg/m  Wt Readings from Last 3 Encounters:  06/09/16 169 lb 4 oz (76.8 kg)  04/04/16 175 lb (79.4 kg)  12/31/15 176 lb 6 oz (80 kg)     Lab Results  Component Value Date   WBC 8.2 06/06/2016   HGB 14.1 06/06/2016   HCT 41.5 06/06/2016   PLT 229.0 06/06/2016   GLUCOSE 148 (H) 06/06/2016   CHOL 154 06/06/2016   TRIG 100.0 06/06/2016   HDL 47.90 06/06/2016   LDLCALC 86 06/06/2016   ALT 11 06/06/2016   AST 9 06/06/2016   NA 139 06/06/2016   K  3.9 06/06/2016   CL 108 06/06/2016   CREATININE 1.22 06/06/2016   BUN 20 06/06/2016   CO2 23 06/06/2016   TSH 2.02 06/06/2016   PSA 3.06 06/06/2016   HGBA1C 6.3 06/06/2016   MICROALBUR <0.7 06/06/2016    Lab Results  Component Value Date   TSH 2.02 06/06/2016   Lab Results  Component Value Date   WBC 8.2 06/06/2016   HGB 14.1 06/06/2016   HCT 41.5 06/06/2016   MCV 88.3 06/06/2016   PLT 229.0 06/06/2016   Lab Results  Component Value Date   NA 139 06/06/2016   K 3.9 06/06/2016   CO2 23 06/06/2016   GLUCOSE 148 (H) 06/06/2016   BUN 20 06/06/2016   CREATININE 1.22 06/06/2016   BILITOT 0.4 06/06/2016   ALKPHOS 57 06/06/2016   AST 9 06/06/2016   ALT 11 06/06/2016   PROT 6.2 06/06/2016   ALBUMIN 4.0 06/06/2016   CALCIUM 8.9 06/06/2016   GFR 60.84 06/06/2016   Lab Results  Component Value Date   CHOL 154 06/06/2016   Lab Results  Component Value Date   HDL 47.90 06/06/2016   Lab Results  Component Value Date   LDLCALC 86 06/06/2016   Lab Results  Component Value Date   TRIG 100.0 06/06/2016   Lab Results  Component Value Date   CHOLHDL 3 06/06/2016   Lab Results  Component Value Date   HGBA1C 6.3 06/06/2016       Assessment & Plan:   Problem List Items Addressed This Visit    Diabetes mellitus type 2 in nonobese ([place])    hgba1c acceptable, minimize simple carbs. Increase  exercise as tolerated. Continue current meds      Relevant Orders   Hemoglobin A1c   Comprehensive metabolic panel   TSH   Hyperlipidemia    Encouraged heart healthy diet, increase exercise, avoid trans fats, consider a krill oil cap daily      Relevant Medications   acetaZOLAMIDE (DIAMOX) 250 MG tablet   Other Relevant Orders   TSH   Lipid panel   Glaucoma (increased eye pressure)    Follows with Dr Zadie Rhine and Dr Katy Fitch now. His pressure increased in left eye from 14 to 30 so they had to put him on Diamox 250 tid with hematuria backed off to bid and no visible blood noted will check UA today.      Relevant Medications   brimonidine-timolol (COMBIGAN) 0.2-0.5 % ophthalmic solution   BPH (benign prostatic hyperplasia)    Has established care with [facility] and is awaiting uroplasty. 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 They will have to wait til January after his eye surgeries are completed      Medicare annual wellness visit, subsequent    Patient denies any difficulties at home. No trouble with ADLs, depression or falls. See EMR for functional status screen and depression screen. No recent changes to vision or hearing. Is UTD with immunizations. Is UTD with screening. Discussed Advanced Directives. Encouraged heart healthy diet, exercise as tolerated and adequate sleep. See patient's problem list for health risk factors to monitor. See AVS for preventative healthcare recommendation schedule.       Other Visit Diagnoses    Urinary frequency    -  Primary   Relevant Orders   Urinalysis (Completed)   CBC   TSH      I have discontinued Mr. Rachlin Brinzolamide-Brimonidine [facility] OP). I am  also having him maintain his travoprost (benzalkonium), valACYclovir, RESTASIS, TRAVATAN Z, vardenafil, ipratropium, JANUVIA, simvastatin, metFORMIN, tamsulosin, ranitidine, glyBURIDE, acetaZOLAMIDE, brimonidine-timolol, [employer] LANCETS,  and glucose blood.  Meds ordered this encounter  Medications  . acetaZOLAMIDE (DIAMOX) 250 MG tablet    Sig: Take 250 mg by mouth 2 (two) times daily.  . brimonidine-timolol (COMBIGAN) 0.2-0.5 % ophthalmic solution    Sig: Place 1 drop into both eyes 2 (two) times daily.  Marland Kitchen DISCONTD: glucose blood (TRUE METRIX BLOOD GLUCOSE TEST) test strip    Sig: Use as directed once daily to check blood sugar..  DX E11.9  . DISCONTDFestus Barren LANCETS MISC    Sig: Use as directed once daily to check blood sugar.  DX E11.9  . [employer] LANCETS MISC    Sig: Use as directed once daily to check blood sugar.  DX E11.9    Dispense:  100 each    Refill:  6  . glucose blood (TRUE METRIX BLOOD GLUCOSE TEST) test strip    Sig: Use as directed once daily to check blood sugar..  DX E11.9    Dispense:  100 each    Refill:  6     Penni Homans, MD

## 2016-06-09 NOTE — Assessment & Plan Note (Signed)
Patient denies any difficulties at home. No trouble with ADLs, depression or falls. See EMR for functional status screen and depression screen. No recent changes to vision or hearing. Is UTD with immunizations. Is UTD with screening. Discussed Advanced Directives. Encouraged heart healthy diet, exercise as tolerated and adequate sleep. See patient's problem list for health risk factors to monitor. See AVS for preventative healthcare recommendation schedule. 

## 2016-06-09 NOTE — Assessment & Plan Note (Signed)
Follows with Dr Zadie Rhine and Dr Katy Fitch now. His pressure increased in left eye from 14 to 30 so they had to put him on Diamox 250 tid with hematuria backed off to bid and no visible blood noted will check UA today.

## 2016-06-11 ENCOUNTER — Other Ambulatory Visit: Payer: Self-pay | Admitting: Family Medicine

## 2016-06-11 NOTE — Telephone Encounter (Signed)
Refill sent per [facility] refill protocol/SLS  

## 2016-07-03 ENCOUNTER — Other Ambulatory Visit: Payer: Self-pay | Admitting: Family Medicine

## 2016-07-31 ENCOUNTER — Telehealth: Payer: Self-pay | Admitting: Family Medicine

## 2016-07-31 MED ORDER — GLUCOSE BLOOD VI STRP: ORAL_STRIP | 6 refills | Status: DC

## 2016-07-31 NOTE — Telephone Encounter (Signed)
Self.   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 He need to have TRUE METRIX BLOOD GLUCOSE TEST sent to the [facility] on [address].   He says that he will be out in 10 days.  He would like a confirmation call once sent if possible.

## 2016-07-31 NOTE — Telephone Encounter (Signed)
Sent in as requested 

## 2016-08-24 ENCOUNTER — Other Ambulatory Visit: Payer: Self-pay | Admitting: Family Medicine

## 2016-09-04 HISTORY — PX: TRABECULECTOMY: SHX107

## 2016-09-06 ENCOUNTER — Other Ambulatory Visit: Payer: Self-pay | Admitting: Family Medicine

## 2016-09-09 ENCOUNTER — Other Ambulatory Visit: Payer: Self-pay | Admitting: Family Medicine

## 2016-09-09 MED ORDER — METFORMIN HCL 1000 MG PO TABS: ORAL_TABLET | ORAL | 2 refills | Status: DC

## 2016-11-04 ENCOUNTER — Other Ambulatory Visit: Payer: Self-pay | Admitting: Family Medicine

## 2016-11-25 ENCOUNTER — Other Ambulatory Visit: Payer: Self-pay | Admitting: Family Medicine

## 2016-12-08 ENCOUNTER — Other Ambulatory Visit (INDEPENDENT_AMBULATORY_CARE_PROVIDER_SITE_OTHER): Payer: Medicare Other

## 2016-12-08 DIAGNOSIS — E782 Mixed hyperlipidemia: Secondary | ICD-10-CM

## 2016-12-08 DIAGNOSIS — R350 Frequency of micturition: Secondary | ICD-10-CM | POA: Diagnosis not present

## 2016-12-08 DIAGNOSIS — E119 Type 2 diabetes mellitus without complications: Secondary | ICD-10-CM

## 2016-12-08 LAB — COMPREHENSIVE METABOLIC PANEL
ALT: 14 U/L (ref 0–53)
AST: 16 U/L (ref 0–37)
Albumin: 4.3 g/dL (ref 3.5–5.2)
Alkaline Phosphatase: 60 U/L (ref 39–117)
BUN: 24 mg/dL — AB (ref 6–23)
CHLORIDE: 106 meq/L (ref 96–112)
CO2: 26 meq/L (ref 19–32)
Calcium: 9.3 mg/dL (ref 8.4–10.5)
Creatinine, Ser: 1.27 mg/dL (ref 0.40–1.50)
GFR: 58.01 mL/min — ABNORMAL LOW (ref >60.00)
Glucose, Bld: 175 mg/dL — ABNORMAL HIGH (ref 70–99)
POTASSIUM: 4.3 meq/L (ref 3.5–5.1)
SODIUM: 138 meq/L (ref 135–145)
Total Bilirubin: 0.6 mg/dL (ref 0.2–1.2)
Total Protein: 6.8 g/dL (ref 6.0–8.3)

## 2016-12-08 LAB — LIPID PANEL
Cholesterol: 171 mg/dL (ref 0–200)
HDL: 60.6 mg/dL (ref >39.00)
LDL CALC: 96 mg/dL (ref 0–99)
NONHDL: 110.23
Total CHOL/HDL Ratio: 3
Triglycerides: 69 mg/dL (ref 0.0–149.0)
VLDL: 13.8 mg/dL (ref 0.0–40.0)

## 2016-12-08 LAB — CBC
HCT: 43.7 % (ref 39.0–52.0)
Hemoglobin: 14.7 g/dL (ref 13.0–17.0)
MCHC: 33.7 g/dL (ref 30.0–36.0)
MCV: 89.3 fl (ref 78.0–100.0)
PLATELETS: 274 10*3/uL (ref 150.0–400.0)
RBC: 4.89 Mil/uL (ref 4.22–5.81)
RDW: 14 % (ref 11.5–15.5)
WBC: 8 10*3/uL (ref 4.0–10.5)

## 2016-12-08 LAB — HEMOGLOBIN A1C: Hgb A1c MFr Bld: 6.8 % — ABNORMAL HIGH (ref 4.6–6.5)

## 2016-12-08 LAB — TSH: TSH: 1.49 u[IU]/mL (ref 0.35–4.50)

## 2016-12-11 ENCOUNTER — Encounter: Payer: Self-pay | Admitting: Family Medicine

## 2016-12-11 ENCOUNTER — Ambulatory Visit (INDEPENDENT_AMBULATORY_CARE_PROVIDER_SITE_OTHER): Payer: Medicare Other | Admitting: Family Medicine

## 2016-12-11 VITALS — BP 120/64 | HR 65 | Temp 97.8°F | Resp 18 | Wt 171.4 lb

## 2016-12-11 DIAGNOSIS — E119 Type 2 diabetes mellitus without complications: Secondary | ICD-10-CM

## 2016-12-11 DIAGNOSIS — R350 Frequency of micturition: Secondary | ICD-10-CM

## 2016-12-11 DIAGNOSIS — E782 Mixed hyperlipidemia: Secondary | ICD-10-CM | POA: Diagnosis not present

## 2016-12-11 DIAGNOSIS — N401 Enlarged prostate with lower urinary tract symptoms: Secondary | ICD-10-CM

## 2016-12-11 DIAGNOSIS — K219 Gastro-esophageal reflux disease without esophagitis: Secondary | ICD-10-CM

## 2016-12-11 NOTE — Assessment & Plan Note (Signed)
hgba1c acceptable, minimize simple carbs. Increase exercise as tolerated. Continue current meds 

## 2016-12-11 NOTE — Progress Notes (Signed)
Subjective:  I acted as a Education administrator for Dr. Charlett Blake. [place], [place]  Patient ID: [name], male    DOB: 1935/04/11, 82 y.o.   MRN: [id number]  No chief complaint on file.   HPI  Patient is in today for a 6 month follow up. Patient has no acute concerns. He states he has been doing well at home continues to work at home daily with no st backs. No recent febrile illness or acute hospitalizations. Denies CP/palp/SOB/HA/congestion/fevers/GI or GU c/o. Taking meds as prescribed. He continues to stay active and eats a heart healthy diet. Has undergone b/l cataract removal and done well. Also underwent a Urolift and his urinary frequency has resolved.    Patient Care Team: Mosie Lukes, MD as PCP - General (Family Medicine) Zadie Rhine Clent Demark, MD as Consulting Physician (Ophthalmology)   Past Medical History:  Diagnosis Date  . BPH (benign prostatic hypertrophy)   . Diabetes mellitus 2000   type 2  . Diabetes mellitus type 2 in nonobese [facility]) 06/19/2009   Qualifier: Diagnosis of  By: Wynona Luna    . GERD (gastroesophageal reflux disease)   . Glaucoma    followed by Dr Lanell Matar at [facility]  . History of colonic polyps 12/04/2014  . Hyperlipidemia   . Medicare annual wellness visit, subsequent 12/31/2015  . Onychomycosis 09/17/2015  . Vasomotor rhinitis 05/02/2013    Past Surgical History:  Procedure Laterality Date  . APPENDECTOMY  1943  . EYE SURGERY     laser surgery on left numerous times.     Family History  Problem Relation Age of Onset  . Cancer Sister        breast  . Obstructive Sleep Apnea Sister   . Heart disease Father        MI  . Diabetes Father   . Arthritis Brother        knees  . Heart disease Brother        a fib  . Colon cancer Paternal Uncle 63    Social History   Social History  . Marital status: Married    Spouse name: Mary  . Number of children: 0  . Years of education: N/A   Occupational History  . semi retired Education administrator  .   Retired   Social History Main Topics  . Smoking status: Never Smoker  . Smokeless tobacco: Never Used  . Alcohol use 0.0 oz/week     Comment: once monthly "maybe"  . Drug use: No  . Sexual activity: Yes     Comment: lives with wife, travels frequently, no major dietary restrictions. exercises regularly with aerobic, stretch and weights   Other Topics Concern  . Not on file   Social History Narrative  . No narrative on file    Outpatient Medications Prior to Visit  Medication Sig Dispense Refill  . brimonidine-timolol (COMBIGAN) 0.2-0.5 % ophthalmic solution Place 1 drop into the right eye 2 (two) times daily.     Marland Kitchen glucose blood (TRUE METRIX BLOOD GLUCOSE TEST) test strip Use as directed once daily to check blood sugar..  DX E11.9 100 each 6  . glyBURIDE (DIABETA) 2.5 MG tablet TAKE 1 TABLET DAILY WITH BREAKFAST 90 tablet 0  . ipratropium (ATROVENT) 0.03 % nasal spray Place 2 sprays into both nostrils 2 (two) times daily as needed for rhinitis. 90 mL 1  . JANUVIA 100 MG tablet TAKE 1 TABLET DAILY 90 tablet 2  . metFORMIN (GLUCOPHAGE) 1000 MG  tablet Take 1 tablet twice a day and one-half (1/2) tablet every noon 225 tablet 2  . ranitidine (ZANTAC) 150 MG tablet TAKE 1 TABLET DAILY 90 tablet 1  . RESTASIS 0.05 % ophthalmic emulsion Place 1 drop into both eyes 2 (two) times daily.     . simvastatin (ZOCOR) 10 MG tablet TAKE 1 TABLET AT BEDTIME 90 tablet 2  . tamsulosin (FLOMAX) 0.4 MG CAPS capsule TAKE 1 CAPSULE DAILY 90 capsule 1  . travoprost, benzalkonium, (TRAVATAN) 0.004 % ophthalmic solution 1 drop at bedtime.      . valACYclovir (VALTREX) 1000 MG tablet Take 1 tablet (1,000 mg total) by mouth daily. 90 tablet 0  . vardenafil (LEVITRA) 20 MG tablet Take 1 tablet (20 mg total) by mouth daily as needed. 30 tablet 1  . [place] LANCETS MISC Use as directed once daily to check blood sugar.  DX E11.9 100 each 6  . acetaZOLAMIDE (DIAMOX) 250 MG tablet Take 250 mg by mouth 2 (two) times  daily.    . TRAVATAN Z 0.004 % SOLN ophthalmic solution Place 1 drop into both eyes at bedtime.      No facility-administered medications prior to visit.     Allergies  Allergen Reactions  . Levaquin [Levofloxacin In D5w] Hives    Review of Systems  Constitutional: Negative for fever and malaise/fatigue.  HENT: Negative for congestion.   Eyes: Negative for blurred vision.  Respiratory: Negative for cough and shortness of breath.   Cardiovascular: Negative for chest pain, palpitations and leg swelling.  Gastrointestinal: Negative for vomiting.  Musculoskeletal: Negative for back pain.  Skin: Negative for rash.  Neurological: Negative for loss of consciousness and headaches.       Objective:    Physical Exam  Constitutional: He is oriented to person, place, and time. He appears well-developed and well-nourished. No distress.  HENT:  Head: Normocephalic and atraumatic.  Eyes: Conjunctivae are normal.  Neck: Normal range of motion. No thyromegaly present.  Cardiovascular: Normal rate and regular rhythm.   Pulmonary/Chest: Effort normal and breath sounds normal. He has no wheezes.  Abdominal: Soft. Bowel sounds are normal. There is no tenderness.  Musculoskeletal: Normal range of motion. He exhibits no edema or deformity.  Neurological: He is alert and oriented to person, place, and time.  Skin: Skin is warm and dry. He is not diaphoretic.  Psychiatric: He has a normal mood and affect.    BP 120/64   Pulse 65   Temp 97.8 F (36.6 C) (Oral)   Resp 18   Wt 171 lb 6.4 oz (77.7 kg)   SpO2 97%   BMI 22.61 kg/m  Wt Readings from Last 3 Encounters:  12/11/16 171 lb 6.4 oz (77.7 kg)  06/09/16 169 lb 4 oz (76.8 kg)  04/04/16 175 lb (79.4 kg)   BP Readings from Last 3 Encounters:  12/11/16 120/64  06/09/16 112/68  04/04/16 (!) 142/70     Immunization History  Administered Date(s) Administered  . Influenza Split 05/20/2011  . Influenza Whole 04/24/2009, 04/16/2010  .  Influenza, High Dose Seasonal PF 05/02/2013  . Influenza,inj,Quad PF,36+ Mos 06/04/2015  . Influenza-Unspecified 05/05/2014, 04/30/2016  . Pneumococcal Conjugate-13 06/04/2015  . Pneumococcal Polysaccharide-23 03/20/2009  . Td 11/16/2007  . Zoster 03/27/2009    Health Maintenance  Topic Date Due  . FOOT EXAM  01/20/1947  . INFLUENZA VACCINE  02/04/2017  . OPHTHALMOLOGY EXAM  04/07/2017  . URINE MICROALBUMIN  06/06/2017  . HEMOGLOBIN A1C  06/09/2017  .  TETANUS/TDAP  11/15/2017  . COLONOSCOPY  07/10/2018  . PNA vac Low Risk Adult  Completed    Lab Results  Component Value Date   WBC 8.0 12/08/2016   HGB 14.7 12/08/2016   HCT 43.7 12/08/2016   PLT 274.0 12/08/2016   GLUCOSE 175 (H) 12/08/2016   CHOL 171 12/08/2016   TRIG 69.0 12/08/2016   HDL 60.60 12/08/2016   LDLCALC 96 12/08/2016   ALT 14 12/08/2016   AST 16 12/08/2016   NA 138 12/08/2016   K 4.3 12/08/2016   CL 106 12/08/2016   CREATININE 1.27 12/08/2016   BUN 24 (H) 12/08/2016   CO2 26 12/08/2016   TSH 1.49 12/08/2016   PSA 3.06 06/06/2016   HGBA1C 6.8 (H) 12/08/2016   MICROALBUR <0.7 06/06/2016    Lab Results  Component Value Date   TSH 1.49 12/08/2016   Lab Results  Component Value Date   WBC 8.0 12/08/2016   HGB 14.7 12/08/2016   HCT 43.7 12/08/2016   MCV 89.3 12/08/2016   PLT 274.0 12/08/2016   Lab Results  Component Value Date   NA 138 12/08/2016   K 4.3 12/08/2016   CO2 26 12/08/2016   GLUCOSE 175 (H) 12/08/2016   BUN 24 (H) 12/08/2016   CREATININE 1.27 12/08/2016   BILITOT 0.6 12/08/2016   ALKPHOS 60 12/08/2016   AST 16 12/08/2016   ALT 14 12/08/2016   PROT 6.8 12/08/2016   ALBUMIN 4.3 12/08/2016   CALCIUM 9.3 12/08/2016   GFR 58.01 (L) 12/08/2016   Lab Results  Component Value Date   CHOL 171 12/08/2016   Lab Results  Component Value Date   HDL 60.60 12/08/2016   Lab Results  Component Value Date   LDLCALC 96 12/08/2016   Lab Results  Component Value Date   TRIG 69.0  12/08/2016   Lab Results  Component Value Date   CHOLHDL 3 12/08/2016   Lab Results  Component Value Date   HGBA1C 6.8 (H) 12/08/2016         Assessment & Plan:   Problem List Items Addressed This Visit    Diabetes mellitus type 2 in nonobese ([place]) - Primary    hgba1c acceptable, minimize simple carbs. Increase exercise as tolerated. Continue current meds      Relevant Orders   Hemoglobin A1c   CBC   Comprehensive metabolic panel   TSH   Hyperlipidemia    Tolerating statin, encouraged heart healthy diet, avoid trans fats, minimize simple carbs and saturated fats. Increase exercise as tolerated      Relevant Orders   Lipid panel   GERD    No complaints today, usually diet controlled      BPH (benign prostatic hyperplasia)    Under went urolift at [facility] with Dr Eveline Keto is no longer getting up at night.       Relevant Orders   CBC   Comprehensive metabolic panel   TSH      I have discontinued Mr. Mehringer TRAVATAN Z and acetaZOLAMIDE. I am also having him maintain his travoprost (benzalkonium), valACYclovir, RESTASIS, vardenafil, ipratropium, tamsulosin, brimonidine-timolol, [employer] LANCETS, glucose blood, simvastatin, JANUVIA, metFORMIN, ranitidine, glyBURIDE, prednisoLONE acetate, and brinzolamide.  Meds ordered this encounter  Medications  . prednisoLONE acetate (PRED FORTE) 1 % ophthalmic suspension    Sig: Place 1 drop into the left eye daily.  . brinzolamide (AZOPT) 1 % ophthalmic suspension    Sig: Place 1 drop into the right eye 3 (three) times daily.    Marland Kitchen  CMA served as Education administrator during this visit. History, Physical and Plan performed by medical provider. Documentation and orders reviewed and attested to.  Penni Homans, MD

## 2016-12-11 NOTE — Patient Instructions (Signed)
Hyland's leg cramp medicine and hydrate roughly 64 oz of fluids daily Carbohydrate Counting for Diabetes Mellitus, Adult Carbohydrate counting is a method for keeping track of how many carbohydrates you eat. Eating carbohydrates naturally increases the amount of sugar (glucose) in the blood. Counting how many carbohydrates you eat helps keep your blood glucose within normal limits, which helps you manage your diabetes (diabetes mellitus). It is important to know how many carbohydrates you can safely have in each meal. This is different for every person. A diet and nutrition specialist (registered dietitian) can help you make a meal plan and calculate how many carbohydrates you should have at each meal and snack. Carbohydrates are found in the following foods:  Grains, such as breads and cereals.  Dried beans and soy products.  Starchy vegetables, such as potatoes, peas, and corn.  Fruit and fruit juices.  Milk and yogurt.  Sweets and snack foods, such as cake, cookies, candy, chips, and soft drinks.  How do I count carbohydrates? There are two ways to count carbohydrates in food. You can use either of the methods or a combination of both. Reading "Nutrition Facts" on packaged food The "Nutrition Facts" list is included on the labels of almost all packaged foods and beverages in the [place] It includes:  The serving size.  Information about nutrients in each serving, including the grams (g) of carbohydrate per serving.  To use the "Nutrition Facts":  Decide how many servings you will have.  Multiply the number of servings by the number of carbohydrates per serving.  The resulting number is the total amount of carbohydrates that you will be having.  Learning standard serving sizes of other foods When you eat foods containing carbohydrates that are not packaged or do not include "Nutrition Facts" on the label, you need to measure the servings in order to count the amount of  carbohydrates:  Measure the foods that you will eat with a food scale or measuring cup, if needed.  Decide how many standard-size servings you will eat.  Multiply the number of servings by 15. Most carbohydrate-rich foods have about 15 g of carbohydrates per serving. ? For example, if you eat 8 oz (170 g) of strawberries, you will have eaten 2 servings and 30 g of carbohydrates (2 servings x 15 g = 30 g).  For foods that have more than one food mixed, such as soups and casseroles, you must count the carbohydrates in each food that is included.  The following list contains standard serving sizes of common carbohydrate-rich foods. Each of these servings has about 15 g of carbohydrates:   hamburger bun or  English muffin.   oz (15 mL) syrup.   oz (14 g) jelly.  1 slice of bread.  1 six-inch tortilla.  3 oz (85 g) cooked rice or pasta.  4 oz (113 g) cooked dried beans.  4 oz (113 g) starchy vegetable, such as peas, corn, or potatoes.  4 oz (113 g) hot cereal.  4 oz (113 g) mashed potatoes or  of a large baked potato.  4 oz (113 g) canned or frozen fruit.  4 oz (120 mL) fruit juice.  4-6 crackers.  6 chicken nuggets.  6 oz (170 g) unsweetened dry cereal.  6 oz (170 g) plain fat-free yogurt or yogurt sweetened with artificial sweeteners.  8 oz (240 mL) milk.  8 oz (170 g) fresh fruit or one small piece of fruit.  24 oz (680 g) popped popcorn.  Example  of carbohydrate counting Sample meal  3 oz (85 g) chicken breast.  6 oz (170 g) brown rice.  4 oz (113 g) corn.  8 oz (240 mL) milk.  8 oz (170 g) strawberries with sugar-free whipped topping. Carbohydrate calculation 1. Identify the foods that contain carbohydrates: ? Rice. ? Corn. ? Milk. ? Strawberries. 2. Calculate how many servings you have of each food: ? 2 servings rice. ? 1 serving corn. ? 1 serving milk. ? 1 serving strawberries. 3. Multiply each number of servings by 15 g: ? 2 servings  rice x 15 g = 30 g. ? 1 serving corn x 15 g = 15 g. ? 1 serving milk x 15 g = 15 g. ? 1 serving strawberries x 15 g = 15 g. 4. Add together all of the amounts to find the total grams of carbohydrates eaten: ? 30 g + 15 g + 15 g + 15 g = 75 g of carbohydrates total. This information is not intended to replace advice given to you by your health care provider. Make sure you discuss any questions you have with your health care provider. Document Released: 06/23/2005 Document Revised: 01/11/2016 Document Reviewed: 12/05/2015 Elsevier Interactive Patient Education  [employer].

## 2016-12-11 NOTE — Assessment & Plan Note (Signed)
Tolerating statin, encouraged heart healthy diet, avoid trans fats, minimize simple carbs and saturated fats. Increase exercise as tolerated 

## 2016-12-11 NOTE — Assessment & Plan Note (Signed)
Under went urolift at [facility] with Dr Eveline Keto is no longer getting up at night.

## 2016-12-14 NOTE — Assessment & Plan Note (Signed)
No complaints today, usually diet controlled

## 2017-02-08 ENCOUNTER — Other Ambulatory Visit: Payer: Self-pay | Admitting: Family Medicine

## 2017-04-18 ENCOUNTER — Other Ambulatory Visit: Payer: Self-pay | Admitting: Family Medicine

## 2017-05-22 ENCOUNTER — Other Ambulatory Visit: Payer: Self-pay | Admitting: Family Medicine

## 2017-05-25 NOTE — Progress Notes (Signed)
Subjective:   [name] is a 82 y.o. male who presents for Medicare Annual/Subsequent preventive examination.  Review of Systems:  No ROS.  Medicare Wellness Visit. Additional risk factors are reflected in the social history.  Cardiac Risk Factors include: advanced age (>56men, >49 women);diabetes mellitus;dyslipidemia;male gender Sleep patterns: Pt states he sleeps fine.   Male:   CCS- last 07/17/15: recall 3 yrs     PSA-  Lab Results  Component Value Date   PSA 3.06 06/06/2016   PSA 2.90 05/28/2015   PSA 2.35 08/04/2013       Objective:    Vitals: BP 138/80 (BP Location: Left Arm, Patient Position: Sitting, Cuff Size: Normal)   Pulse 71   Ht 6\' 1"  (1.854 m)   Wt 177 lb (80.3 kg)   SpO2 99%   BMI 23.35 kg/m   Body mass index is 23.35 kg/m.  Tobacco Social History   Tobacco Use  Smoking Status Never Smoker  Smokeless Tobacco Never Used     Counseling given: Not Answered   Past Medical History:  Diagnosis Date  . BPH (benign prostatic hypertrophy)   . Diabetes mellitus 2000   type 2  . Diabetes mellitus type 2 in nonobese [facility]) 06/19/2009   Qualifier: Diagnosis of  By: Wynona Luna    . GERD (gastroesophageal reflux disease)   . Glaucoma    followed by Dr Lanell Matar at [facility]  . History of colonic polyps 12/04/2014  . Hyperlipidemia   . Medicare annual wellness visit, subsequent 12/31/2015  . Onychomycosis 09/17/2015  . Vasomotor rhinitis 05/02/2013   Past Surgical History:  Procedure Laterality Date  . APPENDECTOMY  1943  . CYSTOSCOPY WITH INSERTION OF UROLIFT    . EYE SURGERY     laser surgery on left numerous times.   . TRABECULECTOMY Left 09/04/2016   Family History  Problem Relation Age of Onset  . Cancer Sister        breast  . Obstructive Sleep Apnea Sister   . Heart disease Father        MI  . Diabetes Father   . Arthritis Brother        knees  . Heart disease Brother        a fib  . Colon cancer Paternal Uncle  38   Social History   Substance and Sexual Activity  Sexual Activity Yes   Comment: lives with wife, travels frequently, no major dietary restrictions. exercises regularly with aerobic, stretch and weights    Outpatient Encounter Medications as of 06/01/2017  Medication Sig  . brimonidine-timolol (COMBIGAN) 0.2-0.5 % ophthalmic solution Place 1 drop into the right eye 2 (two) times daily.   . brinzolamide (AZOPT) 1 % ophthalmic suspension Place 1 drop into the right eye 3 (three) times daily.  Marland Kitchen glucose blood (TRUE METRIX BLOOD GLUCOSE TEST) test strip Use as directed once daily to check blood sugar..  DX E11.9  . glyBURIDE (DIABETA) 2.5 MG tablet TAKE 1 TABLET DAILY WITH BREAKFAST  . ipratropium (ATROVENT) 0.03 % nasal spray Place 2 sprays into both nostrils 2 (two) times daily as needed for rhinitis.  Marland Kitchen JANUVIA 100 MG tablet TAKE 1 TABLET DAILY  . metFORMIN (GLUCOPHAGE) 1000 MG tablet Take 1 tablet twice a day and one-half (1/2) tablet every noon  . prednisoLONE acetate (PRED FORTE) 1 % ophthalmic suspension Place 1 drop into the left eye daily.  . ranitidine (ZANTAC) 150 MG tablet TAKE 1 TABLET DAILY  .  RESTASIS 0.05 % ophthalmic emulsion Place 1 drop into both eyes 2 (two) times daily.   . simvastatin (ZOCOR) 10 MG tablet TAKE 1 TABLET AT BEDTIME  . travoprost, benzalkonium, (TRAVATAN) 0.004 % ophthalmic solution 1 drop at bedtime.    . valACYclovir (VALTREX) 1000 MG tablet Take 1 tablet (1,000 mg total) by mouth daily.  . vardenafil (LEVITRA) 20 MG tablet Take 1 tablet (20 mg total) by mouth daily as needed.  Festus Barren LANCETS MISC Use as directed once daily to check blood sugar.  DX E11.9  . [DISCONTINUED] glyBURIDE (DIABETA) 2.5 MG tablet TAKE 1 TABLET DAILY WITH BREAKFAST  . [DISCONTINUED] JANUVIA 100 MG tablet TAKE 1 TABLET DAILY  . [DISCONTINUED] simvastatin (ZOCOR) 10 MG tablet TAKE 1 TABLET AT BEDTIME  . [DISCONTINUED] tamsulosin (FLOMAX) 0.4 MG CAPS capsule TAKE 1 CAPSULE  DAILY   No facility-administered encounter medications on file as of 06/01/2017.     Activities of Daily Living In your present state of health, do you have any difficulty performing the following activities: 06/01/2017  Hearing? N  Vision? N  Comment Reading glasses.  Difficulty concentrating or making decisions? N  Walking or climbing stairs? N  Dressing or bathing? N  Doing errands, shopping? N  Preparing Food and eating ? N  Using the Toilet? N  In the past six months, have you accidently leaked urine? N  Do you have problems with loss of bowel control? N  Managing your Medications? N  Managing your Finances? N  Housekeeping or managing your Housekeeping? N  Some recent data might be hidden    Patient Care Team: Mosie Lukes, MD as PCP - General (Family Medicine) Zadie Rhine Clent Demark, MD as Consulting Physician (Ophthalmology)   Assessment:    Physical assessment deferred to PCP.  Exercise Activities and Dietary recommendations Current Exercise Habits: Structured exercise class, Type of exercise: treadmill, Time (Minutes): 30, Frequency (Times/Week): 7, Weekly Exercise (Minutes/Week): 210, Intensity: Mild Diet (meal preparation, eat out, water intake, caffeinated beverages, dairy products, fruits and vegetables): in general, a "healthy" diet         Goals    . Maintain health      Fall Risk Fall Risk  06/01/2017 04/04/2016 12/31/2015 11/27/2014  Falls in the past year? No No No No   Depression Screen PHQ 2/9 Scores 06/01/2017 04/04/2016 12/31/2015 11/27/2014  PHQ - 2 Score 0 0 0 0    Cognitive Function MMSE - Mini Mental State Exam 06/01/2017 04/04/2016  Not completed: Refused Refused        Immunization History  Administered Date(s) Administered  . Influenza Split 05/20/2011  . Influenza Whole 04/24/2009, 04/16/2010  . Influenza, High Dose Seasonal PF 05/02/2013  . Influenza,inj,Quad PF,6+ Mos 06/04/2015  . Influenza-Unspecified 05/05/2014, 04/30/2016  .  Pneumococcal Conjugate-13 06/04/2015  . Pneumococcal Polysaccharide-23 03/20/2009  . Td 11/16/2007  . Zoster 03/27/2009   Screening Tests Health Maintenance  Topic Date Due  . FOOT EXAM  01/20/1945  . OPHTHALMOLOGY EXAM  04/07/2017  . URINE MICROALBUMIN  06/06/2017  . HEMOGLOBIN A1C  06/09/2017  . TETANUS/TDAP  11/15/2017  . COLONOSCOPY  07/10/2018  . INFLUENZA VACCINE  Completed  . PNA vac Low Risk Adult  Completed      Plan:   Follow up with PCP as directed  Continue to eat heart healthy diet (full of fruits, vegetables, whole grains, lean protein, water--limit salt, fat, and sugar intake) and increase physical activity as tolerated.  Continue doing brain stimulating activities (  puzzles, reading, adult coloring books, staying active) to keep memory sharp.    I have personally reviewed and noted the following in the patient's chart:   . Medical and social history . Use of alcohol, tobacco or illicit drugs  . Current medications and supplements . Functional ability and status . Nutritional status . Physical activity . Advanced directives . List of other physicians . Hospitalizations, surgeries, and ER visits in previous 12 months . Vitals . Screenings to include cognitive, depression, and falls . Referrals and appointments  In addition, I have reviewed and discussed with patient certain preventive protocols, quality metrics, and best practice recommendations. A written personalized care plan for preventive services as well as general preventive health recommendations were provided to patient.     Shela Nevin, [place]  06/01/2017  Medical screening examination was performed by Health Coach and as supervising physician I was immediately available for consultation/collaboration. I have reviewed documentation and agree with assessment and plan.  Penni Homans, MD

## 2017-06-01 ENCOUNTER — Encounter: Payer: Self-pay | Admitting: *Deleted

## 2017-06-01 ENCOUNTER — Ambulatory Visit (INDEPENDENT_AMBULATORY_CARE_PROVIDER_SITE_OTHER): Payer: Medicare Other | Admitting: *Deleted

## 2017-06-01 DIAGNOSIS — N401 Enlarged prostate with lower urinary tract symptoms: Secondary | ICD-10-CM

## 2017-06-01 DIAGNOSIS — E782 Mixed hyperlipidemia: Secondary | ICD-10-CM

## 2017-06-01 DIAGNOSIS — E119 Type 2 diabetes mellitus without complications: Secondary | ICD-10-CM | POA: Diagnosis not present

## 2017-06-01 DIAGNOSIS — R350 Frequency of micturition: Secondary | ICD-10-CM

## 2017-06-01 LAB — COMPREHENSIVE METABOLIC PANEL
ALT: 17 U/L (ref 0–53)
AST: 15 U/L (ref 0–37)
Albumin: 4.1 g/dL (ref 3.5–5.2)
Alkaline Phosphatase: 51 U/L (ref 39–117)
BUN: 17 mg/dL (ref 6–23)
CALCIUM: 9.5 mg/dL (ref 8.4–10.5)
CHLORIDE: 104 meq/L (ref 96–112)
CO2: 28 meq/L (ref 19–32)
Creatinine, Ser: 1.27 mg/dL (ref 0.40–1.50)
GFR: 57.94 mL/min — AB (ref >60.00)
GLUCOSE: 187 mg/dL — AB (ref 70–99)
POTASSIUM: 4.5 meq/L (ref 3.5–5.1)
Sodium: 139 mEq/L (ref 135–145)
Total Bilirubin: 0.5 mg/dL (ref 0.2–1.2)
Total Protein: 6.7 g/dL (ref 6.0–8.3)

## 2017-06-01 LAB — CBC
HEMATOCRIT: 44.4 % (ref 39.0–52.0)
HEMOGLOBIN: 14.8 g/dL (ref 13.0–17.0)
MCHC: 33.3 g/dL (ref 30.0–36.0)
MCV: 89.7 fl (ref 78.0–100.0)
PLATELETS: 272 10*3/uL (ref 150.0–400.0)
RBC: 4.95 Mil/uL (ref 4.22–5.81)
RDW: 14.1 % (ref 11.5–15.5)
WBC: 7.7 10*3/uL (ref 4.0–10.5)

## 2017-06-01 LAB — LIPID PANEL
Cholesterol: 169 mg/dL (ref 0–200)
HDL: 54.8 mg/dL (ref >39.00)
LDL CALC: 90 mg/dL (ref 0–99)
NonHDL: 114.52
TRIGLYCERIDES: 125 mg/dL (ref 0.0–149.0)
Total CHOL/HDL Ratio: 3
VLDL: 25 mg/dL (ref 0.0–40.0)

## 2017-06-01 LAB — TSH: TSH: 1.44 u[IU]/mL (ref 0.35–4.50)

## 2017-06-01 LAB — HEMOGLOBIN A1C: Hgb A1c MFr Bld: 6.7 % — ABNORMAL HIGH (ref 4.6–6.5)

## 2017-06-01 NOTE — Patient Instructions (Signed)
Continue to eat heart healthy diet (full of fruits, vegetables, whole grains, lean protein, water--limit salt, fat, and sugar intake) and increase physical activity as tolerated.  Continue doing brain stimulating activities (puzzles, reading, adult coloring books, staying active) to keep memory sharp.    [name] , Thank you for taking time to come for your Medicare Wellness Visit. I appreciate your ongoing commitment to your health goals. Please review the following plan we discussed and let me know if I can assist you in the future.   These are the goals we discussed: Goals    . Maintain health       This is a list of the screening recommended for you and due dates:  Health Maintenance  Topic Date Due  . Complete foot exam   01/20/1945  . Eye exam for diabetics  04/07/2017  . Urine Protein Check  06/06/2017  . Hemoglobin A1C  06/09/2017  . Tetanus Vaccine  11/15/2017  . Colon Cancer Screening  07/10/2018  . Flu Shot  Completed  . Pneumonia vaccines  Completed     Health Maintenance, Male A healthy lifestyle and preventive care is important for your health and wellness. Ask your health care provider about what schedule of regular examinations is right for you. What should I know about weight and diet? Eat a Healthy Diet  Eat plenty of vegetables, fruits, whole grains, low-fat dairy products, and lean protein.  Do not eat a lot of foods high in solid fats, added sugars, or salt.  Maintain a Healthy Weight Regular exercise can help you achieve or maintain a healthy weight. You should:  Do at least 150 minutes of exercise each week. The exercise should increase your heart rate and make you sweat (moderate-intensity exercise).  Do strength-training exercises at least twice a week.  Watch Your Levels of Cholesterol and Blood Lipids  Have your blood tested for lipids and cholesterol every 5 years starting at 80 years of age. If you are at high risk for heart disease, you  should start having your blood tested when you are 80 years old. You may need to have your cholesterol levels checked more often if: ? Your lipid or cholesterol levels are high. ? You are older than 80 years of age. ? You are at high risk for heart disease.  What should I know about cancer screening? Many types of cancers can be detected early and may often be prevented. Lung Cancer  You should be screened every year for lung cancer if: ? You are a current smoker who has smoked for at least 30 years. ? You are a former smoker who has quit within the past 15 years.  Talk to your health care provider about your screening options, when you should start screening, and how often you should be screened.  Colorectal Cancer  Routine colorectal cancer screening usually begins at 81 years of age and should be repeated every 5-10 years until you are 80 years old. You may need to be screened more often if early forms of precancerous polyps or small growths are found. Your health care provider may recommend screening at an earlier age if you have risk factors for colon cancer.  Your health care provider may recommend using home test kits to check for hidden blood in the stool.  A small camera at the end of a tube can be used to examine your colon (sigmoidoscopy or colonoscopy). This checks for the earliest forms of colorectal cancer.  Prostate  and Testicular Cancer  Depending on your age and overall health, your health care provider may do certain tests to screen for prostate and testicular cancer.  Talk to your health care provider about any symptoms or concerns you have about testicular or prostate cancer.  Skin Cancer  Check your skin from head to toe regularly.  Tell your health care provider about any new moles or changes in moles, especially if: ? There is a change in a mole's size, shape, or color. ? You have a mole that is larger than a pencil eraser.  Always use sunscreen. Apply  sunscreen liberally and repeat throughout the day.  Protect yourself by wearing long sleeves, pants, a wide-brimmed hat, and sunglasses when outside.  What should I know about heart disease, diabetes, and high blood pressure?  If you are 79-34 years of age, have your blood pressure checked every 3-5 years. If you are 45 years of age or older, have your blood pressure checked every year. You should have your blood pressure measured twice-once when you are at a hospital or clinic, and once when you are not at a hospital or clinic. Record the average of the two measurements. To check your blood pressure when you are not at a hospital or clinic, you can use: ? An automated blood pressure machine at a pharmacy. ? A home blood pressure monitor.  Talk to your health care provider about your target blood pressure.  If you are between 77-5 years old, ask your health care provider if you should take aspirin to prevent heart disease.  Have regular diabetes screenings by checking your fasting blood sugar level. ? If you are at a normal weight and have a low risk for diabetes, have this test once every three years after the age of 66. ? If you are overweight and have a high risk for diabetes, consider being tested at a younger age or more often.  A one-time screening for abdominal aortic aneurysm (AAA) by ultrasound is recommended for men aged 53-75 years who are current or former smokers. What should I know about preventing infection? Hepatitis B If you have a higher risk for hepatitis B, you should be screened for this virus. Talk with your health care provider to find out if you are at risk for hepatitis B infection. Hepatitis C Blood testing is recommended for:  Everyone born from [birth year] through 1963.  Anyone with known risk factors for hepatitis C.  Sexually Transmitted Diseases (STDs)  You should be screened each year for STDs including gonorrhea and chlamydia if: ? You are sexually active  and are younger than 80 years of age. ? You are older than 80 years of age and your health care provider tells you that you are at risk for this type of infection. ? Your sexual activity has changed since you were last screened and you are at an increased risk for chlamydia or gonorrhea. Ask your health care provider if you are at risk.  Talk with your health care provider about whether you are at high risk of being infected with HIV. Your health care provider may recommend a prescription medicine to help prevent HIV infection.  What else can I do?  Schedule regular health, dental, and eye exams.  Stay current with your vaccines (immunizations).  Do not use any tobacco products, such as cigarettes, chewing tobacco, and e-cigarettes. If you need help quitting, ask your health care provider.  Limit alcohol intake to no more than 2 drinks  per day. One drink equals 12 ounces of beer, 5 ounces of wine, or 1 ounces of hard liquor.  Do not use street drugs.  Do not share needles.  Ask your health care provider for help if you need support or information about quitting drugs.  Tell your health care provider if you often feel depressed.  Tell your health care provider if you have ever been abused or do not feel safe at home. This information is not intended to replace advice given to you by your health care provider. Make sure you discuss any questions you have with your health care provider. Document Released: 12/20/2007 Document Revised: 02/20/2016 Document Reviewed: 03/27/2015 Elsevier Interactive Patient Education  [employer].

## 2017-06-08 ENCOUNTER — Ambulatory Visit: Payer: Medicare Other | Admitting: *Deleted

## 2017-06-19 ENCOUNTER — Telehealth: Payer: Self-pay | Admitting: Family Medicine

## 2017-06-19 ENCOUNTER — Encounter: Payer: Self-pay | Admitting: *Deleted

## 2017-06-19 MED ORDER — VALACYCLOVIR HCL 1 G PO TABS: 1000.0000 mg | ORAL_TABLET | Freq: Every day | ORAL | 0 refills | Status: DC

## 2017-06-19 NOTE — Addendum Note (Signed)
Addended by: Kem Boroughs D on: 06/19/2017 03:22 PM   Modules accepted: Orders

## 2017-06-19 NOTE — Telephone Encounter (Signed)
Patient needed a refill on valtrex.  He states that he will not be doing another AWV.  He was not pleased.  He wanted a copy of lab results.    [name] will you call him to schedule an appointment with Dr. Charlett Blake for follow up.  He rushed me off the phone before I could try to schedule.  Rx sent in and labs printed and mailed

## 2017-06-19 NOTE — Telephone Encounter (Signed)
Called patient and left message to return call

## 2017-06-19 NOTE — Telephone Encounter (Signed)
Spoke with [name] regarding his annual wellness visit. Patient stated that he had his wellness on 06/01/17 and that you were supposed to follow up with him regarding his prescriptions. He stated during his visit he had a very detailed conversation regarding his medications. He also has questions regarding his lab results. Patient would like for you to give him a call on his cell at [phone]

## 2017-08-02 ENCOUNTER — Other Ambulatory Visit: Payer: Self-pay | Admitting: Family Medicine

## 2017-08-31 ENCOUNTER — Other Ambulatory Visit: Payer: Self-pay | Admitting: Family Medicine

## 2017-09-23 ENCOUNTER — Other Ambulatory Visit: Payer: Self-pay | Admitting: Family Medicine

## 2017-10-23 ENCOUNTER — Other Ambulatory Visit: Payer: Self-pay | Admitting: Family Medicine

## 2017-10-27 NOTE — Telephone Encounter (Deleted)
p 

## 2017-11-07 ENCOUNTER — Other Ambulatory Visit: Payer: Self-pay | Admitting: Family Medicine

## 2017-11-16 ENCOUNTER — Telehealth: Payer: Self-pay | Admitting: Family Medicine

## 2017-11-16 DIAGNOSIS — E782 Mixed hyperlipidemia: Secondary | ICD-10-CM

## 2017-11-16 DIAGNOSIS — E119 Type 2 diabetes mellitus without complications: Secondary | ICD-10-CM

## 2017-11-16 DIAGNOSIS — E785 Hyperlipidemia, unspecified: Secondary | ICD-10-CM

## 2017-11-16 NOTE — Telephone Encounter (Signed)
Copied from [place] [id number]. Topic: Quick Communication - See Telephone Encounter >> Nov 16, 2017  2:33 PM Ether Griffins B wrote: CRM for notification. See Telephone encounter for: 11/16/17.  Pt would like some labs ordered for the appt on 12/08/17. He states his blood sugar has been slowly inching up and he has some concerns about that. He has months of recordings of your daily blood sugar check.

## 2017-11-16 NOTE — Telephone Encounter (Signed)
Spoke with patient I made him aware of calling his insurance company to make sure they will pay for lab work prior to his visit. He stated he does not see a problem why they would not pay and he suggested I place him on the lab schedule and place his orders. I have placed his orders and placed him on the schedule

## 2017-12-02 ENCOUNTER — Telehealth: Payer: Self-pay | Admitting: *Deleted

## 2017-12-02 ENCOUNTER — Other Ambulatory Visit (INDEPENDENT_AMBULATORY_CARE_PROVIDER_SITE_OTHER): Payer: Medicare Other

## 2017-12-02 DIAGNOSIS — E785 Hyperlipidemia, unspecified: Secondary | ICD-10-CM | POA: Diagnosis not present

## 2017-12-02 DIAGNOSIS — E119 Type 2 diabetes mellitus without complications: Secondary | ICD-10-CM

## 2017-12-02 LAB — CBC
HEMATOCRIT: 42.1 % (ref 39.0–52.0)
Hemoglobin: 14.2 g/dL (ref 13.0–17.0)
MCHC: 33.6 g/dL (ref 30.0–36.0)
MCV: 91.9 fl (ref 78.0–100.0)
Platelets: 264 10*3/uL (ref 150.0–400.0)
RBC: 4.58 Mil/uL (ref 4.22–5.81)
RDW: 14 % (ref 11.5–15.5)
WBC: 7.4 10*3/uL (ref 4.0–10.5)

## 2017-12-02 LAB — LIPID PANEL
CHOLESTEROL: 163 mg/dL (ref 0–200)
HDL: 56.8 mg/dL (ref >39.00)
LDL CALC: 82 mg/dL (ref 0–99)
NonHDL: 105.92
TRIGLYCERIDES: 120 mg/dL (ref 0.0–149.0)
Total CHOL/HDL Ratio: 3
VLDL: 24 mg/dL (ref 0.0–40.0)

## 2017-12-02 LAB — COMPREHENSIVE METABOLIC PANEL
ALBUMIN: 4.2 g/dL (ref 3.5–5.2)
ALK PHOS: 55 U/L (ref 39–117)
ALT: 16 U/L (ref 0–53)
AST: 13 U/L (ref 0–37)
BILIRUBIN TOTAL: 0.7 mg/dL (ref 0.2–1.2)
BUN: 27 mg/dL — ABNORMAL HIGH (ref 6–23)
CALCIUM: 9.4 mg/dL (ref 8.4–10.5)
CHLORIDE: 103 meq/L (ref 96–112)
CO2: 27 mEq/L (ref 19–32)
CREATININE: 1.4 mg/dL (ref 0.40–1.50)
GFR: 51.71 mL/min — ABNORMAL LOW (ref >60.00)
Glucose, Bld: 196 mg/dL — ABNORMAL HIGH (ref 70–99)
Potassium: 5.1 mEq/L (ref 3.5–5.1)
Sodium: 138 mEq/L (ref 135–145)
Total Protein: 6.7 g/dL (ref 6.0–8.3)

## 2017-12-02 LAB — TSH: TSH: 1.63 u[IU]/mL (ref 0.35–4.50)

## 2017-12-02 LAB — HEMOGLOBIN A1C: HEMOGLOBIN A1C: 6.7 % — AB (ref 4.6–6.5)

## 2017-12-02 NOTE — Telephone Encounter (Signed)
Patient came in for blood draw today and wanted a PSA done.  Last PSA was drawn 06/06/16 and it was 3.06.  Has an appt with you on 12/08/17 for follow up diabetes.  We add test on but we need to add on within 24 hours.  Can we add on?

## 2017-12-02 NOTE — Telephone Encounter (Signed)
Yes please add PSA for nocturia.

## 2017-12-03 ENCOUNTER — Other Ambulatory Visit (INDEPENDENT_AMBULATORY_CARE_PROVIDER_SITE_OTHER): Payer: Medicare Other

## 2017-12-03 ENCOUNTER — Ambulatory Visit (INDEPENDENT_AMBULATORY_CARE_PROVIDER_SITE_OTHER): Payer: Medicare Other | Admitting: Family Medicine

## 2017-12-03 VITALS — BP 131/77 | HR 73 | Temp 97.9°F | Resp 18 | Wt 171.0 lb

## 2017-12-03 DIAGNOSIS — G8929 Other chronic pain: Secondary | ICD-10-CM | POA: Diagnosis not present

## 2017-12-03 DIAGNOSIS — R972 Elevated prostate specific antigen [PSA]: Secondary | ICD-10-CM

## 2017-12-03 DIAGNOSIS — E782 Mixed hyperlipidemia: Secondary | ICD-10-CM

## 2017-12-03 DIAGNOSIS — R351 Nocturia: Secondary | ICD-10-CM | POA: Diagnosis not present

## 2017-12-03 DIAGNOSIS — M549 Dorsalgia, unspecified: Secondary | ICD-10-CM | POA: Diagnosis not present

## 2017-12-03 DIAGNOSIS — E119 Type 2 diabetes mellitus without complications: Secondary | ICD-10-CM | POA: Diagnosis not present

## 2017-12-03 LAB — PSA: PSA: 6.05 ng/mL — ABNORMAL HIGH (ref 0.10–4.00)

## 2017-12-03 MED ORDER — VARDENAFIL HCL 20 MG PO TABS: 20.0000 mg | ORAL_TABLET | Freq: Every day | ORAL | 2 refills | Status: DC | PRN

## 2017-12-03 NOTE — Patient Instructions (Addendum)
Consider Trulicity or Victoza injections  Could consider stopping the Glyburide and/or Januvia if we add the above meds  For now increase the Metformin to 2.5 tabs daily divided into 3 doses, ie 1 tab twice daily morning and night and 1/2 tab around noon  If you add that third dose of metformin and sugars drop consider holding the Glyburide and see if the numbers normalize  Prostate-Specific Antigen Test Why am I having this test? The prostate-specific antigen (PSA) test is performed to determine how much PSA you have in your blood. PSA is a type of protein that is normally present in the prostate gland. Certain conditions can cause PSA blood levels to increase, such as:  Infection in the prostate (prostatitis).  Enlargement of the prostate (hypertrophy).  Prostate cancer.  Because PSA levels increase greatly from prostate cancer, this test can be used to confirm a diagnosis of prostate cancer. It may also be used to monitor treatment for prostate cancer and to watch for a return of prostate cancer after treatment has finished. This test has a very high false-positive rate. Therefore, routine PSA screening for all men is no longer recommended. A false-positive result is incorrect because it indicates a condition or finding is present when it is not. What kind of sample is taken? A blood sample is required for this test. It is usually collected by inserting a needle into a vein or by sticking a finger with a small needle. How do I prepare for this test? There is no preparation required for this test. However, there are factors that can affect the results of a PSA test. To get the most accurate results:  Avoid having a rectal exam within several hours before having your blood drawn for this test.  Avoid having any procedures performed on the prostate gland within 6 weeks of having this test.  Avoid ejaculating within 24 hours of having this test.  Tell your health care provider if you  had a recent urinary tract infection (UTI).  Tell your health care provider if you are taking medicines to assist with hair growth, such as finasteride.  Tell your health care provider if you have been exposed to a medicine called diethylstilbestrol.  Let your health care provider know if any of these factors apply to you. You may be asked to reschedule the test. What are the reference ranges? Reference ranges are established after testing a large group of people. Reference ranges may vary among different people, labs, and hospitals. It is your responsibility to obtain your test results. Ask the lab or department performing the test when and how you will get your results.  Low: 0-2.5 ng/mL.  Slightly to moderately elevated: 2.6-10.0 ng/mL.  Moderately elevated: 10.0-19.9 ng/mL.  Significantly elevated: 20 ng/mL or greater.  What do the results mean? PSA test results greater than 4 ng/mL are found in the majority of men with prostate cancer. If your test result is above this level, this can indicate an increased risk for prostate cancer. Increased PSA levels can also indicate other health conditions. Talk with your health care provider to discuss your results, treatment options, and if necessary, the need for more tests. Talk with your health care provider if you have any questions about your results. Talk with your health care provider to discuss your results, treatment options, and if necessary, the need for more tests. Talk with your health care provider if you have any questions about your results. This information is not intended to  replace advice given to you by your health care provider. Make sure you discuss any questions you have with your health care provider. Document Released: 07/26/2004 Document Revised: 02/27/2016 Document Reviewed: 11/16/2013 Elsevier Interactive Patient Education  [employer].

## 2017-12-03 NOTE — Progress Notes (Signed)
Subjective:  I acted as a Education administrator for Dr. Charlett Blake. Joeziah Voit, [place]  Patient ID: [name], male    DOB: 28-Feb-1935, 83 y.o.   MRN: [id number]  No chief complaint on file.   HPI  Patient is in today for a follow up to discuss labs. He reports he feels well. No recent febrile illness or hospitalizations. He has been checking his sugars at home and notes numbers generally between 100 and 160. No polyuria or polydipsia. Denies CP/palp/SOB/HA/congestion/fevers/GI or GU c/o. Taking meds as prescribed  Patient Care Team: Mosie Lukes, MD as PCP - General (Family Medicine) Zadie Rhine Clent Demark, MD as Consulting Physician (Ophthalmology)   Past Medical History:  Diagnosis Date  . BPH (benign prostatic hypertrophy)   . Diabetes mellitus 2000   type 2  . Diabetes mellitus type 2 in nonobese [facility]) 06/19/2009   Qualifier: Diagnosis of  By: Wynona Luna    . GERD (gastroesophageal reflux disease)   . Glaucoma    followed by Dr Lanell Matar at [facility]  . History of colonic polyps 12/04/2014  . Hyperlipidemia   . Medicare annual wellness visit, subsequent 12/31/2015  . Onychomycosis 09/17/2015  . Vasomotor rhinitis 05/02/2013    Past Surgical History:  Procedure Laterality Date  . APPENDECTOMY  1943  . CYSTOSCOPY WITH INSERTION OF UROLIFT    . EYE SURGERY     laser surgery on left numerous times.   . TRABECULECTOMY Left 09/04/2016    Family History  Problem Relation Age of Onset  . Cancer Sister        breast  . Obstructive Sleep Apnea Sister   . Heart disease Father        MI  . Diabetes Father   . Arthritis Brother        knees  . Heart disease Brother        a fib  . Colon cancer Paternal Uncle 27    Social History   Socioeconomic History  . Marital status: Married    Spouse name: Mary  . Number of children: 0  . Years of education: Not on file  . Highest education level: Not on file  Occupational History  . 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 Financial resource strain: Not on file  . Food insecurity:    Worry: Not on file    Inability: Not on file  . Transportation needs:    Medical: Not on file    Non-medical: Not on file  Tobacco Use  . Smoking status: Never Smoker  . Smokeless tobacco: Never Used  Substance and Sexual Activity  . Alcohol use: Yes    Alcohol/week: 0.0 oz    Comment: once monthly "maybe"  . Drug use: No  . Sexual activity: Yes    Comment: lives with wife, travels frequently, no major dietary restrictions. exercises regularly with aerobic, stretch and weights  Lifestyle  . Physical activity:    Days per week: Not on file    Minutes per session: Not on file  . Stress: Not on file  Relationships  . Social connections:    Talks on phone: Not on file    Gets together: Not on file    Attends religious service: Not on file    Active member of club or organization: Not on file    Attends meetings of clubs or organizations: Not on file    Relationship status: Not  on file  . Intimate partner violence:    Fear of current or ex partner: Not on file    Emotionally abused: Not on file    Physically abused: Not on file    Forced sexual activity: Not on file  Other Topics Concern  . Not on file  Social History Narrative  . Not on file    Outpatient Medications Prior to Visit  Medication Sig Dispense Refill  . brimonidine-timolol (COMBIGAN) 0.2-0.5 % ophthalmic solution Place 1 drop into the right eye 2 (two) times daily.     . brinzolamide (AZOPT) 1 % ophthalmic suspension Place 1 drop into the right eye 3 (three) times daily.    Marland Kitchen glyBURIDE (DIABETA) 2.5 MG tablet TAKE 1 TABLET DAILY WITH BREAKFAST 90 tablet 0  . ipratropium (ATROVENT) 0.03 % nasal spray Place 2 sprays into both nostrils 2 (two) times daily as needed for rhinitis. 90 mL 1  . JANUVIA 100 MG tablet TAKE 1 TABLET DAILY 90 tablet 2  . metFORMIN (GLUCOPHAGE) 1000 MG tablet TAKE 1 TABLET TWICE A DAY AND  ONE-HALF (1/2) TABLET EVERY NOON 225 tablet 2  . prednisoLONE acetate (PRED FORTE) 1 % ophthalmic suspension Place 1 drop into the left eye daily.    . ranitidine (ZANTAC) 150 MG tablet TAKE 1 TABLET DAILY 90 tablet 1  . RESTASIS 0.05 % ophthalmic emulsion Place 1 drop into both eyes 2 (two) times daily.     . simvastatin (ZOCOR) 10 MG tablet TAKE 1 TABLET AT BEDTIME 90 tablet 2  . travoprost, benzalkonium, (TRAVATAN) 0.004 % ophthalmic solution 1 drop at bedtime.      . TRUE METRIX BLOOD GLUCOSE TEST test strip USE AS DIRECTED TO CHECK BLOOD SUGAR ONCE DAILY 1 each 0  . valACYclovir (VALTREX) 1000 MG tablet TAKE 1 TABLET DAILY 90 tablet 0  . vardenafil (LEVITRA) 20 MG tablet Take 1 tablet (20 mg total) by mouth daily as needed. 30 tablet 1  . [employer] LANCETS MISC Use as directed once daily to check blood sugar.  DX E11.9 100 each 6   No facility-administered medications prior to visit.     Allergies  Allergen Reactions  . Levaquin [Levofloxacin In D5w] Hives    Review of Systems  Constitutional: Negative for fever and malaise/fatigue.  HENT: Negative for congestion.   Eyes: Negative for blurred vision.  Respiratory: Negative for shortness of breath.   Cardiovascular: Negative for chest pain, palpitations and leg swelling.  Gastrointestinal: Negative for abdominal pain, blood in stool and nausea.  Genitourinary: Negative for dysuria and frequency.  Musculoskeletal: Negative for falls.  Skin: Negative for rash.  Neurological: Negative for dizziness, loss of consciousness and headaches.  Endo/Heme/Allergies: Negative for environmental allergies.  Psychiatric/Behavioral: Negative for depression. The patient is not nervous/anxious.        Objective:    Physical Exam  Constitutional: He is oriented to person, place, and time. He appears well-developed and well-nourished. No distress.  HENT:  Head: Normocephalic and atraumatic.  Nose: Nose normal.  Eyes: Right eye exhibits no  discharge. Left eye exhibits no discharge.  Neck: Normal range of motion. Neck supple.  Cardiovascular: Normal rate and regular rhythm.  No murmur heard. Pulmonary/Chest: Effort normal and breath sounds normal.  Abdominal: Soft. Bowel sounds are normal. There is no tenderness.  Musculoskeletal: He exhibits no edema.  Neurological: He is alert and oriented to person, place, and time.  Skin: Skin is warm and dry.  Psychiatric: He has a normal mood  and affect.  Nursing note and vitals reviewed.   There were no vitals taken for this visit. Wt Readings from Last 3 Encounters:  06/01/17 177 lb (80.3 kg)  12/11/16 171 lb 6.4 oz (77.7 kg)  06/09/16 169 lb 4 oz (76.8 kg)   BP Readings from Last 3 Encounters:  06/01/17 138/80  12/11/16 120/64  06/09/16 112/68     Immunization History  Administered Date(s) Administered  . Influenza Split 05/20/2011  . Influenza Whole 04/24/2009, 04/16/2010  . Influenza, High Dose Seasonal PF 05/02/2013  . Influenza,inj,Quad PF,6+ Mos 06/04/2015  . Influenza-Unspecified 05/05/2014, 04/30/2016  . Pneumococcal Conjugate-13 06/04/2015  . Pneumococcal Polysaccharide-23 03/20/2009  . Td 11/16/2007  . Zoster 03/27/2009    Health Maintenance  Topic Date Due  . FOOT EXAM  01/20/1947  . OPHTHALMOLOGY EXAM  04/07/2017  . URINE MICROALBUMIN  06/06/2017  . TETANUS/TDAP  11/15/2017  . INFLUENZA VACCINE  02/04/2018  . HEMOGLOBIN A1C  06/04/2018  . COLONOSCOPY  07/10/2018  . PNA vac Low Risk Adult  Completed    Lab Results  Component Value Date   WBC 7.4 12/02/2017   HGB 14.2 12/02/2017   HCT 42.1 12/02/2017   PLT 264.0 12/02/2017   GLUCOSE 196 (H) 12/02/2017   CHOL 163 12/02/2017   TRIG 120.0 12/02/2017   HDL 56.80 12/02/2017   LDLCALC 82 12/02/2017   ALT 16 12/02/2017   AST 13 12/02/2017   NA 138 12/02/2017   K 5.1 12/02/2017   CL 103 12/02/2017   CREATININE 1.40 12/02/2017   BUN 27 (H) 12/02/2017   CO2 27 12/02/2017   TSH 1.63 12/02/2017     PSA 6.05 (H) 12/03/2017   HGBA1C 6.7 (H) 12/02/2017   MICROALBUR <0.7 06/06/2016    Lab Results  Component Value Date   TSH 1.63 12/02/2017   Lab Results  Component Value Date   WBC 7.4 12/02/2017   HGB 14.2 12/02/2017   HCT 42.1 12/02/2017   MCV 91.9 12/02/2017   PLT 264.0 12/02/2017   Lab Results  Component Value Date   NA 138 12/02/2017   K 5.1 12/02/2017   CO2 27 12/02/2017   GLUCOSE 196 (H) 12/02/2017   BUN 27 (H) 12/02/2017   CREATININE 1.40 12/02/2017   BILITOT 0.7 12/02/2017   ALKPHOS 55 12/02/2017   AST 13 12/02/2017   ALT 16 12/02/2017   PROT 6.7 12/02/2017   ALBUMIN 4.2 12/02/2017   CALCIUM 9.4 12/02/2017   GFR 51.71 (L) 12/02/2017   Lab Results  Component Value Date   CHOL 163 12/02/2017   Lab Results  Component Value Date   HDL 56.80 12/02/2017   Lab Results  Component Value Date   LDLCALC 82 12/02/2017   Lab Results  Component Value Date   TRIG 120.0 12/02/2017   Lab Results  Component Value Date   CHOLHDL 3 12/02/2017   Lab Results  Component Value Date   HGBA1C 6.7 (H) 12/02/2017         Assessment & Plan:   Problem List Items Addressed This Visit    None      I am having Brewer L. Defenbaugh maintain his travoprost (benzalkonium), RESTASIS, vardenafil, ipratropium, brimonidine-timolol, [employer] LANCETS, prednisoLONE acetate, brinzolamide, JANUVIA, simvastatin, metFORMIN, valACYclovir, TRUE METRIX BLOOD GLUCOSE TEST, ranitidine, and glyBURIDE.  No orders of the defined types were placed in this encounter.   CMA served as Education administrator during this visit. History, Physical and Plan performed by medical provider. Documentation and orders reviewed and attested  to.  [name], RMA

## 2017-12-03 NOTE — Telephone Encounter (Signed)
Add on for PSA faxed to [facility], they will let us know if its to late to be added.

## 2017-12-06 NOTE — Assessment & Plan Note (Signed)
Encouraged heart healthy diet, increase exercise, avoid trans fats, consider a krill oil cap daily 

## 2017-12-06 NOTE — Assessment & Plan Note (Signed)
hgba1c acceptable, minimize simple carbs. Increase exercise as tolerated. Continue current meds. His recent fsg at home has snuck up recently as high as the 160s but usually 130s, no changes to meds

## 2017-12-08 ENCOUNTER — Ambulatory Visit: Payer: Medicare Other | Admitting: Family Medicine

## 2017-12-27 ENCOUNTER — Other Ambulatory Visit: Payer: Self-pay | Admitting: Family Medicine

## 2018-01-22 ENCOUNTER — Other Ambulatory Visit: Payer: Self-pay | Admitting: Family Medicine

## 2018-03-05 ENCOUNTER — Other Ambulatory Visit (INDEPENDENT_AMBULATORY_CARE_PROVIDER_SITE_OTHER): Payer: Medicare Other

## 2018-03-05 DIAGNOSIS — E782 Mixed hyperlipidemia: Secondary | ICD-10-CM | POA: Diagnosis not present

## 2018-03-05 DIAGNOSIS — E119 Type 2 diabetes mellitus without complications: Secondary | ICD-10-CM | POA: Diagnosis not present

## 2018-03-05 LAB — COMPREHENSIVE METABOLIC PANEL
ALK PHOS: 56 U/L (ref 39–117)
ALT: 15 U/L (ref 0–53)
AST: 15 U/L (ref 0–37)
Albumin: 4.2 g/dL (ref 3.5–5.2)
BILIRUBIN TOTAL: 0.5 mg/dL (ref 0.2–1.2)
BUN: 22 mg/dL (ref 6–23)
CALCIUM: 9.4 mg/dL (ref 8.4–10.5)
CO2: 30 meq/L (ref 19–32)
Chloride: 103 mEq/L (ref 96–112)
Creatinine, Ser: 1.23 mg/dL (ref 0.40–1.50)
GFR: 60.01 mL/min (ref >60.00)
Glucose, Bld: 169 mg/dL — ABNORMAL HIGH (ref 70–99)
POTASSIUM: 5.1 meq/L (ref 3.5–5.1)
Sodium: 139 mEq/L (ref 135–145)
TOTAL PROTEIN: 6.3 g/dL (ref 6.0–8.3)

## 2018-03-05 LAB — LIPID PANEL
CHOL/HDL RATIO: 3
Cholesterol: 156 mg/dL (ref 0–200)
HDL: 56 mg/dL (ref >39.00)
LDL Cholesterol: 86 mg/dL (ref 0–99)
NonHDL: 100.15
TRIGLYCERIDES: 72 mg/dL (ref 0.0–149.0)
VLDL: 14.4 mg/dL (ref 0.0–40.0)

## 2018-03-05 LAB — HEMOGLOBIN A1C: Hgb A1c MFr Bld: 6.6 % — ABNORMAL HIGH (ref 4.6–6.5)

## 2018-03-12 ENCOUNTER — Ambulatory Visit (INDEPENDENT_AMBULATORY_CARE_PROVIDER_SITE_OTHER): Payer: Medicare Other | Admitting: Family Medicine

## 2018-03-12 DIAGNOSIS — E119 Type 2 diabetes mellitus without complications: Secondary | ICD-10-CM | POA: Diagnosis not present

## 2018-03-12 DIAGNOSIS — R350 Frequency of micturition: Secondary | ICD-10-CM

## 2018-03-12 DIAGNOSIS — E782 Mixed hyperlipidemia: Secondary | ICD-10-CM | POA: Diagnosis not present

## 2018-03-12 DIAGNOSIS — N401 Enlarged prostate with lower urinary tract symptoms: Secondary | ICD-10-CM

## 2018-03-12 MED ORDER — VALACYCLOVIR HCL 1 G PO TABS: 1000.0000 mg | ORAL_TABLET | Freq: Every day | ORAL | 1 refills | Status: DC

## 2018-03-12 MED ORDER — IPRATROPIUM BROMIDE 0.03 % NA SOLN: 2.0000 | Freq: Two times a day (BID) | NASAL | 1 refills | Status: DC | PRN

## 2018-03-12 MED ORDER — METFORMIN HCL 1000 MG PO TABS: ORAL_TABLET | ORAL | 2 refills | Status: DC

## 2018-03-12 NOTE — Assessment & Plan Note (Signed)
PSA was elevated to 6.05 was referred back to urology Dr Dorina Hoyer and they rechecked and he was checked in their office and it was in the 5s. Will follow up with them in October

## 2018-03-12 NOTE — Progress Notes (Signed)
Subjective:  I acted as a Education administrator for Dr. Charlett Blake. [name], [place]  Patient ID: [name], male    DOB: [birth year], 83 y.o.   MRN: [id number]  No chief complaint on file.   HPI  Patient is in today for a 3 month follow up.He is following up on his DM, hyperlipidemia and other medical concerns. He has no acite concerns. No recent febrile illness or acute hospitalizations. Denies CP/palp/SOB/HA/congestion/fevers/GI or GU c/o. Taking meds as prescribed. No polyuria or polydipsia.    Patient Care Team: Mosie Lukes, MD as PCP - General (Family Medicine) Zadie Rhine Clent Demark, MD as Consulting Physician (Ophthalmology)   Past Medical History:  Diagnosis Date  . BPH (benign prostatic hypertrophy)   . Diabetes mellitus 2000   type 2  . Diabetes mellitus type 2 in nonobese [facility]) 06/19/2009   Qualifier: Diagnosis of  By: Wynona Luna    . GERD (gastroesophageal reflux disease)   . Glaucoma    followed by Dr Lanell Matar at [facility]  . History of colonic polyps 12/04/2014  . Hyperlipidemia   . Medicare annual wellness visit, subsequent 12/31/2015  . Onychomycosis 09/17/2015  . Vasomotor rhinitis 05/02/2013    Past Surgical History:  Procedure Laterality Date  . APPENDECTOMY  1943  . CYSTOSCOPY WITH INSERTION OF UROLIFT    . EYE SURGERY     laser surgery on left numerous times.   . TRABECULECTOMY Left 09/04/2016    Family History  Problem Relation Age of Onset  . Cancer Sister        breast  . Obstructive Sleep Apnea Sister   . Heart disease Father        MI  . Diabetes Father   . Arthritis Brother        knees  . Heart disease Brother        a fib  . Colon cancer Paternal Uncle 69    Social History   Socioeconomic History  . Marital status: Married    Spouse name: Mary  . Number of children: 0  . Years of education: Not on file  . Highest education level: Not on file  Occupational History  . 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 Financial resource strain: Not on file  . Food insecurity:    Worry: Not on file    Inability: Not on file  . Transportation needs:    Medical: Not on file    Non-medical: Not on file  Tobacco Use  . Smoking status: Never Smoker  . Smokeless tobacco: Never Used  Substance and Sexual Activity  . Alcohol use: Yes    Alcohol/week: 0.0 standard drinks    Comment: once monthly "maybe"  . Drug use: No  . Sexual activity: Yes    Comment: lives with wife, travels frequently, no major dietary restrictions. exercises regularly with aerobic, stretch and weights  Lifestyle  . Physical activity:    Days per week: Not on file    Minutes per session: Not on file  . Stress: Not on file  Relationships  . Social connections:    Talks on phone: Not on file    Gets together: Not on file    Attends religious service: Not on file    Active member of club or organization: Not on file    Attends meetings of clubs or organizations: Not on file    Relationship status: Not on  file  . Intimate partner violence:    Fear of current or ex partner: Not on file    Emotionally abused: Not on file    Physically abused: Not on file    Forced sexual activity: Not on file  Other Topics Concern  . Not on file  Social History Narrative  . Not on file    Outpatient Medications Prior to Visit  Medication Sig Dispense Refill  . brimonidine-timolol (COMBIGAN) 0.2-0.5 % ophthalmic solution Place 1 drop into the right eye 2 (two) times daily.     . brinzolamide (AZOPT) 1 % ophthalmic suspension Place 1 drop into the right eye 3 (three) times daily.    Marland Kitchen glyBURIDE (DIABETA) 2.5 MG tablet TAKE 1 TABLET DAILY WITH BREAKFAST 90 tablet 0  . JANUVIA 100 MG tablet TAKE 1 TABLET DAILY 90 tablet 2  . ranitidine (ZANTAC) 150 MG tablet TAKE 1 TABLET DAILY 90 tablet 1  . RESTASIS 0.05 % ophthalmic emulsion Place 1 drop into both eyes 2 (two) times daily.     . simvastatin (ZOCOR) 10 MG tablet TAKE  1 TABLET AT BEDTIME 90 tablet 2  . travoprost, benzalkonium, (TRAVATAN) 0.004 % ophthalmic solution 1 drop at bedtime.      . TRUE METRIX BLOOD GLUCOSE TEST test strip TEST EVERY DAY 100 each 3  . vardenafil (LEVITRA) 20 MG tablet Take 1 tablet (20 mg total) by mouth daily as needed. 30 tablet 2  . W[employer] LANCETS MISC Use as directed once daily to check blood sugar.  DX E11.9 100 each 6  . ipratropium (ATROVENT) 0.03 % nasal spray Place 2 sprays into both nostrils 2 (two) times daily as needed for rhinitis. 90 mL 1  . metFORMIN (GLUCOPHAGE) 1000 MG tablet TAKE 1 TABLET TWICE A DAY AND ONE-HALF (1/2) TABLET EVERY NOON 225 tablet 2  . valACYclovir (VALTREX) 1000 MG tablet TAKE 1 TABLET DAILY 90 tablet 0   No facility-administered medications prior to visit.     Allergies  Allergen Reactions  . Levaquin [Levofloxacin In D5w] Hives    Review of Systems  Constitutional: Negative for fever and malaise/fatigue.  HENT: Negative for congestion.   Eyes: Negative for blurred vision and double vision.  Respiratory: Negative for shortness of breath.   Cardiovascular: Negative for chest pain, palpitations and leg swelling.  Gastrointestinal: Negative for abdominal pain, blood in stool and nausea.  Genitourinary: Negative for dysuria and frequency.  Musculoskeletal: Negative for falls.  Skin: Negative for rash.  Neurological: Negative for dizziness, loss of consciousness and headaches.  Endo/Heme/Allergies: Negative for environmental allergies.  Psychiatric/Behavioral: Negative for depression. The patient is not nervous/anxious.        Objective:    Physical Exam  Constitutional: He is oriented to person, place, and time. He appears well-developed and well-nourished. No distress.  HENT:  Head: Normocephalic and atraumatic.  Nose: Nose normal.  Eyes: Right eye exhibits no discharge. Left eye exhibits no discharge.  Neck: Normal range of motion. Neck supple.  Cardiovascular: Normal rate  and regular rhythm.  No murmur heard. Pulmonary/Chest: Effort normal and breath sounds normal.  Abdominal: Soft. Bowel sounds are normal. There is no tenderness.  Musculoskeletal: He exhibits no edema.  Neurological: He is alert and oriented to person, place, and time.  Skin: Skin is warm and dry.  Psychiatric: He has a normal mood and affect.  Nursing note and vitals reviewed.   BP (!) 98/58 (BP Location: Left Arm, Patient Position: Sitting, Cuff Size: Normal)  Pulse 61   Temp (!) 97.5 F (36.4 C) (Oral)   Resp 18   Ht 6' (1.829 m)   Wt 174 lb 12.8 oz (79.3 kg)   SpO2 97%   BMI 23.71 kg/m  Wt Readings from Last 3 Encounters:  03/12/18 174 lb 12.8 oz (79.3 kg)  12/03/17 171 lb (77.6 kg)  06/01/17 177 lb (80.3 kg)   BP Readings from Last 3 Encounters:  03/12/18 (!) 98/58  12/03/17 131/77  06/01/17 138/80     Immunization History  Administered Date(s) Administered  . Influenza Split 05/20/2011  . Influenza Whole 04/24/2009, 04/16/2010  . Influenza, High Dose Seasonal PF 05/02/2013  . Influenza,inj,Quad PF,6+ Mos 06/04/2015  . Influenza-Unspecified 05/05/2014, 04/30/2016  . Pneumococcal Conjugate-13 06/04/2015  . Pneumococcal Polysaccharide-23 03/20/2009  . Td 11/16/2007  . Zoster 03/27/2009    Health Maintenance  Topic Date Due  . FOOT EXAM  01/20/1947  . OPHTHALMOLOGY EXAM  04/07/2017  . URINE MICROALBUMIN  06/06/2017  . TETANUS/TDAP  11/15/2017  . INFLUENZA VACCINE  02/04/2018  . COLONOSCOPY  07/10/2018  . HEMOGLOBIN A1C  09/04/2018  . PNA vac Low Risk Adult  Completed    Lab Results  Component Value Date   WBC 7.4 12/02/2017   HGB 14.2 12/02/2017   HCT 42.1 12/02/2017   PLT 264.0 12/02/2017   GLUCOSE 169 (H) 03/05/2018   CHOL 156 03/05/2018   TRIG 72.0 03/05/2018   HDL 56.00 03/05/2018   LDLCALC 86 03/05/2018   ALT 15 03/05/2018   AST 15 03/05/2018   NA 139 03/05/2018   K 5.1 03/05/2018   CL 103 03/05/2018   CREATININE 1.23 03/05/2018   BUN  22 03/05/2018   CO2 30 03/05/2018   TSH 1.63 12/02/2017   PSA 6.05 (H) 12/03/2017   HGBA1C 6.6 (H) 03/05/2018   MICROALBUR <0.7 06/06/2016    Lab Results  Component Value Date   TSH 1.63 12/02/2017   Lab Results  Component Value Date   WBC 7.4 12/02/2017   HGB 14.2 12/02/2017   HCT 42.1 12/02/2017   MCV 91.9 12/02/2017   PLT 264.0 12/02/2017   Lab Results  Component Value Date   NA 139 03/05/2018   K 5.1 03/05/2018   CO2 30 03/05/2018   GLUCOSE 169 (H) 03/05/2018   BUN 22 03/05/2018   CREATININE 1.23 03/05/2018   BILITOT 0.5 03/05/2018   ALKPHOS 56 03/05/2018   AST 15 03/05/2018   ALT 15 03/05/2018   PROT 6.3 03/05/2018   ALBUMIN 4.2 03/05/2018   CALCIUM 9.4 03/05/2018   GFR 60.01 03/05/2018   Lab Results  Component Value Date   CHOL 156 03/05/2018   Lab Results  Component Value Date   HDL 56.00 03/05/2018   Lab Results  Component Value Date   LDLCALC 86 03/05/2018   Lab Results  Component Value Date   TRIG 72.0 03/05/2018   Lab Results  Component Value Date   CHOLHDL 3 03/05/2018   Lab Results  Component Value Date   HGBA1C 6.6 (H) 03/05/2018         Assessment & Plan:   Problem List Items Addressed This Visit    Diabetes mellitus type 2 in nonobese ([place])    hgba1c acceptable, minimize simple carbs. Increase exercise as tolerated. Continue current meds      Relevant Medications   metFORMIN (GLUCOPHAGE) 1000 MG tablet   Other Relevant Orders   Hemoglobin A1c   CBC   Comprehensive metabolic panel  TSH   Hyperlipidemia    Tolerating statin, encouraged heart healthy diet, avoid trans fats, minimize simple carbs and saturated fats. Increase exercise as tolerated      Relevant Orders   Lipid panel   TSH   BPH (benign prostatic hyperplasia)    PSA was elevated to 6.05 was referred back to urology Dr Dorina Hoyer and they rechecked and he was checked in their office and it was in the 5s. Will follow up with them in October      Relevant  Orders   CBC   Comprehensive metabolic panel      I have changed Iona Beard L. Quinonez's valACYclovir and metFORMIN. I am also having him maintain his travoprost (benzalkonium), RESTASIS, brimonidine-timolol, [employer] LANCETS, brinzolamide, ranitidine, vardenafil, TRUE METRIX BLOOD GLUCOSE TEST, JANUVIA, glyBURIDE, simvastatin, and ipratropium.  Meds ordered this encounter  Medications  . ipratropium (ATROVENT) 0.03 % nasal spray    Sig: Place 2 sprays into both nostrils 2 (two) times daily as needed for rhinitis.    Dispense:  90 mL    Refill:  1  . valACYclovir (VALTREX) 1000 MG tablet    Sig: Take 1 tablet (1,000 mg total) by mouth daily.    Dispense:  90 tablet    Refill:  1  . metFORMIN (GLUCOPHAGE) 1000 MG tablet    Sig: Take 1 tablet twice a day and one-half tablet every noon    Dispense:  225 tablet    Refill:  2    CMA served as scribe during this visit. History, Physical and Plan performed by medical provider. Documentation and orders reviewed and attested to.  Penni Homans, MD

## 2018-03-12 NOTE — Assessment & Plan Note (Signed)
Tolerating statin, encouraged heart healthy diet, avoid trans fats, minimize simple carbs and saturated fats. Increase exercise as tolerated 

## 2018-03-12 NOTE — Assessment & Plan Note (Signed)
hgba1c acceptable, minimize simple carbs. Increase exercise as tolerated. Continue current meds 

## 2018-03-12 NOTE — Patient Instructions (Signed)
Shingrix is the new shingles shot, 2 shots over 2-6 month window. At the pharmacy Carbohydrate Counting for Diabetes Mellitus, Adult Carbohydrate counting is a method for keeping track of how many carbohydrates you eat. Eating carbohydrates naturally increases the amount of sugar (glucose) in the blood. Counting how many carbohydrates you eat helps keep your blood glucose within normal limits, which helps you manage your diabetes (diabetes mellitus). It is important to know how many carbohydrates you can safely have in each meal. This is different for every person. A diet and nutrition specialist (registered dietitian) can help you make a meal plan and calculate how many carbohydrates you should have at each meal and snack. Carbohydrates are found in the following foods:  Grains, such as breads and cereals.  Dried beans and soy products.  Starchy vegetables, such as potatoes, peas, and corn.  Fruit and fruit juices.  Milk and yogurt.  Sweets and snack foods, such as cake, cookies, candy, chips, and soft drinks.  How do I count carbohydrates? There are two ways to count carbohydrates in food. You can use either of the methods or a combination of both. Reading "Nutrition Facts" on packaged food The "Nutrition Facts" list is included on the labels of almost all packaged foods and beverages in the [place] It includes:  The serving size.  Information about nutrients in each serving, including the grams (g) of carbohydrate per serving.  To use the "Nutrition Facts":  Decide how many servings you will have.  Multiply the number of servings by the number of carbohydrates per serving.  The resulting number is the total amount of carbohydrates that you will be having.  Learning standard serving sizes of other foods When you eat foods containing carbohydrates that are not packaged or do not include "Nutrition Facts" on the label, you need to measure the servings in order to count the amount of  carbohydrates:  Measure the foods that you will eat with a food scale or measuring cup, if needed.  Decide how many standard-size servings you will eat.  Multiply the number of servings by 15. Most carbohydrate-rich foods have about 15 g of carbohydrates per serving. ? For example, if you eat 8 oz (170 g) of strawberries, you will have eaten 2 servings and 30 g of carbohydrates (2 servings x 15 g = 30 g).  For foods that have more than one food mixed, such as soups and casseroles, you must count the carbohydrates in each food that is included.  The following list contains standard serving sizes of common carbohydrate-rich foods. Each of these servings has about 15 g of carbohydrates:   hamburger bun or  English muffin.   oz (15 mL) syrup.   oz (14 g) jelly.  1 slice of bread.  1 six-inch tortilla.  3 oz (85 g) cooked rice or pasta.  4 oz (113 g) cooked dried beans.  4 oz (113 g) starchy vegetable, such as peas, corn, or potatoes.  4 oz (113 g) hot cereal.  4 oz (113 g) mashed potatoes or  of a large baked potato.  4 oz (113 g) canned or frozen fruit.  4 oz (120 mL) fruit juice.  4-6 crackers.  6 chicken nuggets.  6 oz (170 g) unsweetened dry cereal.  6 oz (170 g) plain fat-free yogurt or yogurt sweetened with artificial sweeteners.  8 oz (240 mL) milk.  8 oz (170 g) fresh fruit or one small piece of fruit.  24 oz (680 g) popped  popcorn.  Example of carbohydrate counting Sample meal  3 oz (85 g) chicken breast.  6 oz (170 g) brown rice.  4 oz (113 g) corn.  8 oz (240 mL) milk.  8 oz (170 g) strawberries with sugar-free whipped topping. Carbohydrate calculation 1. Identify the foods that contain carbohydrates: ? Rice. ? Corn. ? Milk. ? Strawberries. 2. Calculate how many servings you have of each food: ? 2 servings rice. ? 1 serving corn. ? 1 serving milk. ? 1 serving strawberries. 3. Multiply each number of servings by 15 g: ? 2 servings  rice x 15 g = 30 g. ? 1 serving corn x 15 g = 15 g. ? 1 serving milk x 15 g = 15 g. ? 1 serving strawberries x 15 g = 15 g. 4. Add together all of the amounts to find the total grams of carbohydrates eaten: ? 30 g + 15 g + 15 g + 15 g = 75 g of carbohydrates total. This information is not intended to replace advice given to you by your health care provider. Make sure you discuss any questions you have with your health care provider. Document Released: 06/23/2005 Document Revised: 01/11/2016 Document Reviewed: 12/05/2015 Elsevier Interactive Patient Education  [employer].

## 2018-04-16 ENCOUNTER — Other Ambulatory Visit: Payer: Self-pay | Admitting: Family Medicine

## 2018-05-18 ENCOUNTER — Ambulatory Visit (INDEPENDENT_AMBULATORY_CARE_PROVIDER_SITE_OTHER): Payer: Medicare Other | Admitting: Medical

## 2018-05-18 ENCOUNTER — Encounter: Payer: Self-pay | Admitting: Medical

## 2018-05-18 VITALS — BP 111/46 | HR 95 | Temp 97.8°F | Resp 16 | Ht 72.0 in | Wt 171.8 lb

## 2018-05-18 DIAGNOSIS — R05 Cough: Secondary | ICD-10-CM | POA: Diagnosis not present

## 2018-05-18 DIAGNOSIS — R059 Cough, unspecified: Secondary | ICD-10-CM

## 2018-05-18 DIAGNOSIS — J40 Bronchitis, not specified as acute or chronic: Secondary | ICD-10-CM

## 2018-05-18 MED ORDER — DOXYCYCLINE HYCLATE 100 MG PO TABS: 100.0000 mg | ORAL_TABLET | Freq: Two times a day (BID) | ORAL | 0 refills | Status: DC

## 2018-05-18 MED ORDER — HYDROCODONE-HOMATROPINE 5-1.5 MG/5ML PO SYRP: 5.0000 mL | ORAL_SOLUTION | Freq: Four times a day (QID) | ORAL | 0 refills | Status: DC | PRN

## 2018-05-18 NOTE — Patient Instructions (Addendum)
You appear to have bronchitis. Rest hydrate and tylenol for fever. I am prescribing cough medicine hycodan, and doxycycline antibiotic. For your nasal congestion you could use atrovent nasal spray.  You should gradually get better. If not then notify us and would recommend a chest xray.  Rx advisement given.  Follow up in 7-10 days or as needed

## 2018-05-18 NOTE — Progress Notes (Signed)
Subjective:    Patient ID: [name], male    DOB: [birth year], 83 y.o.   MRN: [id number]  HPI  Pt in with some nasal and chest congestion. Pt states he is taking mucinex d and helps stop runny nose and decrease pnd.   He is coughing up colored mucus. Pt symptoms for about 4-5 days. But 2 days of productive cough.   No fever, no chills or sweats. But states slight warm today.   He tried over the counter zycam.  On review no hx of smoking.    Review of Systems  Constitutional: Negative for chills, fatigue and fever.  HENT: Positive for congestion. Negative for facial swelling, mouth sores, postnasal drip, sinus pressure and sinus pain.   Respiratory: Positive for cough. Negative for chest tightness, shortness of breath and wheezing.   Cardiovascular: Negative for chest pain and palpitations.  Gastrointestinal: Negative for abdominal pain.  Musculoskeletal: Negative for back pain.  Skin: Negative for rash.  Neurological: Negative for dizziness, speech difficulty, weakness, numbness and headaches.  Hematological: Negative for adenopathy. Does not bruise/bleed easily.  Psychiatric/Behavioral: Negative for behavioral problems and decreased concentration.    Past Medical History:  Diagnosis Date  . BPH (benign prostatic hypertrophy)   . Diabetes mellitus 2000   type 2  . Diabetes mellitus type 2 in nonobese [facility]) 06/19/2009   Qualifier: Diagnosis of  By: Wynona Luna    . GERD (gastroesophageal reflux disease)   . Glaucoma    follow[facility] by Dr Lanell [facility]ar at [facility]  . History of colonic polyps 12/04/2014  . Hyperlipidemia   . Medicare annual wellness visit, subsequent 12/31/2015  . Onychomycosis 09/17/2015  . Vasomotor rhinitis 05/02/2013     Social History   Socioeconomic History  . Marital status: Married    Spouse name: Mary  . Number of children: 0  . Years of education: Not on file  . Highest education level: Not on file  Occupational History  .  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 Financial resource strain: Not on file  . Food insecurity:    Worry: Not on file    Inability: Not on file  . Transportation needs:    Medical: Not on file    Non-medical: Not on file  Tobacco Use  . Smoking status: Never Smoker  . Smokeless tobacco: Never Used  Substance and Sexual Activity  . Alcohol use: Yes    Alcohol/week: 0.0 standard drinks    Comment: once monthly "maybe"  . Drug use: No  . Sexual activity: Yes    Comment: lives with wife, travels frequently, no major dietary restrictions. exercises regularly with aerobic, stretch and weights  Lifestyle  . Physical activity:    Days per week: Not on file    Minutes per session: Not on file  . Stress: Not on file  Relationships  . Social connections:    Talks on phone: Not on file    Gets together: Not on file    Attends religious service: Not on file    Active member of club or organization: Not on file    Attends meetings of clubs or organizations: Not on file    Relationship status: Not on file  . Intimate partner violence:    Fear of current or ex partner: Not on file    Emotionally abused: Not on file    Physically abused: Not on file  Forced sexual activity: Not on file  Other Topics Concern  . Not on file  Social History Narrative  . Not on file    Past Surgical History:  Procedure Laterality Date  . APPENDECTOMY  1943  . CYSTOSCOPY WITH INSERTION OF UROLIFT    . EYE SURGERY     laser surgery on left numerous times.   . TRABECULECTOMY Left 09/04/2016    Family History  Problem Relation Age of Onset  . Cancer Sister        breast  . Obstructive Sleep Apnea Sister   . Heart disease Father        MI  . Diabetes Father   . Arthritis Brother        knees  . Heart disease Brother        a fib  . Colon cancer Paternal Uncle 60    Allergies  Allergen Reactions  . Levaquin [Levofloxacin In D5w] Hives     Current Outpatient Medications on File Prior to Visit  Medication Sig Dispense Refill  . brimonidine-timolol (COMBIGAN) 0.2-0.5 % ophthalmic solution Place 1 drop into the right eye 2 (two) times daily.     . brinzolamide (AZOPT) 1 % ophthalmic suspension Place 1 drop into the right eye 3 (three) times daily.    Marland Kitchen glyBURIDE (DIABETA) 2.5 MG tablet TAKE 1 TABLET DAILY WITH BREAKFAST 90 tablet 4  . ipratropium (ATROVENT) 0.03 % nasal spray Place 2 sprays into both nostrils 2 (two) times daily as needed for rhinitis. 90 mL 1  . JANUVIA 100 MG tablet TAKE 1 TABLET DAILY 90 tablet 2  . metFORMIN (GLUCOPHAGE) 1000 MG tablet Take 1 tablet twice a day and one-half tablet every noon 225 tablet 2  . ranitidine (ZANTAC) 150 MG tablet TAKE 1 TABLET DAILY 90 tablet 4  . RESTASIS 0.05 % ophthalmic emulsion Place 1 drop into both eyes 2 (two) times daily.     . simvastatin (ZOCOR) 10 MG tablet TAKE 1 TABLET AT BEDTIME 90 tablet 2  . travoprost, benzalkonium, (TRAVATAN) 0.004 % ophthalmic solution 1 drop at bedtime.      . TRUE METRIX BLOOD GLUCOSE TEST test strip TEST EVERY DAY 100 each 3  . valACYclovir (VALTREX) 1000 MG tablet Take 1 tablet (1,000 mg total) by mouth daily. 90 tablet 1  . vardenafil (LEVITRA) 20 MG tablet Take 1 tablet (20 mg total) by mouth daily as needed. 30 tablet 2  . W[employer] LANCETS MISC Use as directed once daily to check blood sugar.  DX E11.9 100 each 6   No current facility-administered medications on file prior to visit.     BP (!) 111/46   Pulse 95   Temp 97.8 F (36.6 C) (Oral)   Resp 16   Ht 6' (1.829 m)   Wt 171 lb 12.8 oz (77.9 kg)   SpO2 99%   BMI 23.30 kg/m       Objective:   Physical Exam  General  Mental Status - Alert. General Appearance - Well groomed. Not in acute distress.  Skin Rashes- No Rashes.  HEENT Head- Normal. Ear Auditory Canal - Left- Normal. Right - Normal.Tympanic Membrane- Left- Normal. Right- Normal. Eye  Sclera/Conjunctiva- Left- Normal. Right- Normal. Nose & Sinuses Nasal Mucosa- Left-  Boggy and Congested. Right-  Boggy and  Congested.Bilateral no maxillary and no frontal sinus pressure. Mouth & Throat Lips: Upper Lip- Normal: no dryness, cracking, pallor, cyanosis, or vesicular eruption. Lower Lip-Normal: no dryness, cracking, pallor, cyanosis or  vesicular eruption. Buccal Mucosa- Bilateral- No Aphthous ulcers. Oropharynx- No Discharge or Erythema. Tonsils: Characteristics- Bilateral- No Erythema or Congestion. Size/Enlargement- Bilateral- No enlargement. Discharge- bilateral-None.  Neck Neck- Supple. No Masses.   Chest and Lung Exam Auscultation: Breath Sounds:-Clear even and unlabored.  Cardiovascular Auscultation:Rythm- Regular, rate and rhythm. Murmurs & Other Heart Sounds:Ausculatation of the heart reveal- No Murmurs.  Lymphatic Head & Neck General Head & Neck Lymphatics: Bilateral: Description- No Localized lymphadenopathy.  Lower ext- no pedal edema.     Assessment & Plan:  You appear to have bronchitis. Rest hydrate and tylenol for fever. I am prescribing cough medicine hycodan, and doxycycline antibiotic. For your nasal congestion you could use atrovent nasal spray which you have.  You should gradually get better. If not then notify us and would recommend a chest xray.  Follow up in 7-10 days or as needed

## 2018-05-24 ENCOUNTER — Other Ambulatory Visit: Payer: Self-pay

## 2018-07-15 ENCOUNTER — Telehealth: Payer: Self-pay | Admitting: Family Medicine

## 2018-07-15 NOTE — Telephone Encounter (Signed)
Copied from [place] [id number]. Topic: Quick Communication - Rx Refill/Question >> Jul 15, 2018  4:10 PM Margot Ables wrote: Medication: TRUE METRIX BLOOD GLUCOSE TEST test strip  Has the patient contacted their pharmacy? Yes - pt was advised to f/u with MD office - they stated that his insurance wasn't covering test strips and fax was sent to the doctors office to contact insurance regarding coverage - pt wants to know if MD received the fax and if MD contacted his insurance company - pt requesting call back. Preferred Pharmacy (with phone number or street name): [id number] - [address], [place] AT [facility] [place] [phone] (Phone) [phone] (Fax)

## 2018-07-22 MED ORDER — GLUCOSE BLOOD VI STRP: ORAL_STRIP | 12 refills | Status: DC

## 2018-07-22 NOTE — Telephone Encounter (Signed)
Medication sent in. 

## 2018-07-23 ENCOUNTER — Other Ambulatory Visit: Payer: Medicare Other

## 2018-07-30 ENCOUNTER — Ambulatory Visit: Payer: Medicare Other | Admitting: Family Medicine

## 2018-08-06 ENCOUNTER — Other Ambulatory Visit (INDEPENDENT_AMBULATORY_CARE_PROVIDER_SITE_OTHER): Payer: Medicare Other

## 2018-08-06 DIAGNOSIS — E119 Type 2 diabetes mellitus without complications: Secondary | ICD-10-CM | POA: Diagnosis not present

## 2018-08-06 DIAGNOSIS — E782 Mixed hyperlipidemia: Secondary | ICD-10-CM

## 2018-08-06 DIAGNOSIS — R350 Frequency of micturition: Secondary | ICD-10-CM

## 2018-08-06 DIAGNOSIS — N401 Enlarged prostate with lower urinary tract symptoms: Secondary | ICD-10-CM

## 2018-08-06 LAB — COMPREHENSIVE METABOLIC PANEL
ALT: 15 U/L (ref 0–53)
AST: 13 U/L (ref 0–37)
Albumin: 4.1 g/dL (ref 3.5–5.2)
Alkaline Phosphatase: 54 U/L (ref 39–117)
BILIRUBIN TOTAL: 0.5 mg/dL (ref 0.2–1.2)
BUN: 19 mg/dL (ref 6–23)
CO2: 28 meq/L (ref 19–32)
Calcium: 9.2 mg/dL (ref 8.4–10.5)
Chloride: 101 mEq/L (ref 96–112)
Creatinine, Ser: 1.2 mg/dL (ref 0.40–1.50)
GFR: 58.03 mL/min — AB (ref >60.00)
GLUCOSE: 168 mg/dL — AB (ref 70–99)
Potassium: 4.5 mEq/L (ref 3.5–5.1)
SODIUM: 137 meq/L (ref 135–145)
TOTAL PROTEIN: 6.2 g/dL (ref 6.0–8.3)

## 2018-08-06 LAB — CBC
HCT: 41.5 % (ref 39.0–52.0)
Hemoglobin: 13.8 g/dL (ref 13.0–17.0)
MCHC: 33.1 g/dL (ref 30.0–36.0)
MCV: 92.9 fl (ref 78.0–100.0)
Platelets: 252 10*3/uL (ref 150.0–400.0)
RBC: 4.47 Mil/uL (ref 4.22–5.81)
RDW: 14.1 % (ref 11.5–15.5)
WBC: 8.1 10*3/uL (ref 4.0–10.5)

## 2018-08-06 LAB — LIPID PANEL
CHOL/HDL RATIO: 3
Cholesterol: 172 mg/dL (ref 0–200)
HDL: 53 mg/dL (ref >39.00)
LDL Cholesterol: 97 mg/dL (ref 0–99)
NONHDL: 118.97
Triglycerides: 108 mg/dL (ref 0.0–149.0)
VLDL: 21.6 mg/dL (ref 0.0–40.0)

## 2018-08-06 LAB — HEMOGLOBIN A1C: Hgb A1c MFr Bld: 6.6 % — ABNORMAL HIGH (ref 4.6–6.5)

## 2018-08-06 LAB — TSH: TSH: 1.69 u[IU]/mL (ref 0.35–4.50)

## 2018-08-13 ENCOUNTER — Ambulatory Visit (INDEPENDENT_AMBULATORY_CARE_PROVIDER_SITE_OTHER): Payer: Medicare Other | Admitting: Family Medicine

## 2018-08-13 VITALS — BP 98/56 | HR 70 | Temp 97.6°F | Resp 16 | Ht 72.0 in | Wt 174.2 lb

## 2018-08-13 DIAGNOSIS — M549 Dorsalgia, unspecified: Secondary | ICD-10-CM

## 2018-08-13 DIAGNOSIS — N401 Enlarged prostate with lower urinary tract symptoms: Secondary | ICD-10-CM | POA: Diagnosis not present

## 2018-08-13 DIAGNOSIS — E119 Type 2 diabetes mellitus without complications: Secondary | ICD-10-CM

## 2018-08-13 DIAGNOSIS — E782 Mixed hyperlipidemia: Secondary | ICD-10-CM

## 2018-08-13 DIAGNOSIS — R350 Frequency of micturition: Secondary | ICD-10-CM

## 2018-08-13 NOTE — Patient Instructions (Addendum)
Shingrix is the new shingles shot, 2 over 2-6 months at pharmacy Consider Tetanus  Shots can be give within 48 hours of an injury at pharmacy or at the office Carbohydrate Counting for Diabetes Mellitus, Adult  Carbohydrate counting is a method of keeping track of how many carbohydrates you eat. Eating carbohydrates naturally increases the amount of sugar (glucose) in the blood. Counting how many carbohydrates you eat helps keep your blood glucose within normal limits, which helps you manage your diabetes (diabetes mellitus). It is important to know how many carbohydrates you can safely have in each meal. This is different for every person. A diet and nutrition specialist (registered dietitian) can help you make a meal plan and calculate how many carbohydrates you should have at each meal and snack. Carbohydrates are found in the following foods:  Grains, such as breads and cereals.  Dried beans and soy products.  Starchy vegetables, such as potatoes, peas, and corn.  Fruit and fruit juices.  Milk and yogurt.  Sweets and snack foods, such as cake, cookies, candy, chips, and soft drinks. How do I count carbohydrates? There are two ways to count carbohydrates in food. You can use either of the methods or a combination of both. Reading "Nutrition Facts" on packaged food The "Nutrition Facts" list is included on the labels of almost all packaged foods and beverages in the [place] It includes:  The serving size.  Information about nutrients in each serving, including the grams (g) of carbohydrate per serving. To use the "Nutrition Facts":  Decide how many servings you will have.  Multiply the number of servings by the number of carbohydrates per serving.  The resulting number is the total amount of carbohydrates that you will be having. Learning standard serving sizes of other foods When you eat carbohydrate foods that are not packaged or do not include "Nutrition Facts" on the label, you  need to measure the servings in order to count the amount of carbohydrates:  Measure the foods that you will eat with a food scale or measuring cup, if needed.  Decide how many standard-size servings you will eat.  Multiply the number of servings by 15. Most carbohydrate-rich foods have about 15 g of carbohydrates per serving. ? For example, if you eat 8 oz (170 g) of strawberries, you will have eaten 2 servings and 30 g of carbohydrates (2 servings x 15 g = 30 g).  For foods that have more than one food mixed, such as soups and casseroles, you must count the carbohydrates in each food that is included. The following list contains standard serving sizes of common carbohydrate-rich foods. Each of these servings has about 15 g of carbohydrates:   hamburger bun or  English muffin.   oz (15 mL) syrup.   oz (14 g) jelly.  1 slice of bread.  1 six-inch tortilla.  3 oz (85 g) cooked rice or pasta.  4 oz (113 g) cooked dried beans.  4 oz (113 g) starchy vegetable, such as peas, corn, or potatoes.  4 oz (113 g) hot cereal.  4 oz (113 g) mashed potatoes or  of a large baked potato.  4 oz (113 g) canned or frozen fruit.  4 oz (120 mL) fruit juice.  4-6 crackers.  6 chicken nuggets.  6 oz (170 g) unsweetened dry cereal.  6 oz (170 g) plain fat-free yogurt or yogurt sweetened with artificial sweeteners.  8 oz (240 mL) milk.  8 oz (170 g) fresh fruit  or one small piece of fruit.  24 oz (680 g) popped popcorn. Example of carbohydrate counting Sample meal  3 oz (85 g) chicken breast.  6 oz (170 g) brown rice.  4 oz (113 g) corn.  8 oz (240 mL) milk.  8 oz (170 g) strawberries with sugar-free whipped topping. Carbohydrate calculation 1. Identify the foods that contain carbohydrates: ? Rice. ? Corn. ? Milk. ? Strawberries. 2. Calculate how many servings you have of each food: ? 2 servings rice. ? 1 serving corn. ? 1 serving milk. ? 1 serving  strawberries. 3. Multiply each number of servings by 15 g: ? 2 servings rice x 15 g = 30 g. ? 1 serving corn x 15 g = 15 g. ? 1 serving milk x 15 g = 15 g. ? 1 serving strawberries x 15 g = 15 g. 4. Add together all of the amounts to find the total grams of carbohydrates eaten: ? 30 g + 15 g + 15 g + 15 g = 75 g of carbohydrates total. Summary  Carbohydrate counting is a method of keeping track of how many carbohydrates you eat.  Eating carbohydrates naturally increases the amount of sugar (glucose) in the blood.  Counting how many carbohydrates you eat helps keep your blood glucose within normal limits, which helps you manage your diabetes.  A diet and nutrition specialist (registered dietitian) can help you make a meal plan and calculate how many carbohydrates you should have at each meal and snack. This information is not intended to replace advice given to you by your health care provider. Make sure you discuss any questions you have with your health care provider. Document Released: 06/23/2005 Document Revised: 12/31/2016 Document Reviewed: 12/05/2015 Elsevier Interactive Patient Education  2019 [employer].

## 2018-08-13 NOTE — Progress Notes (Signed)
Subjective:    Patient ID: [name], male    DOB: 10-17-1934, 84 y.o.   MRN: [id number]  Chief Complaint  Patient presents with  . Hyperlipidemia  . Diabetes    HPI Patient is in today for follow up. No recent febrile illness or hospitalizations. Just had a prostate biopsy with [facility]. No recent chills or acute concerns. Denies CP/palp/SOB/HA/congestion/fevers/GI or GU c/o. Taking meds as prescribed  Past Medical History:  Diagnosis Date  . BPH (benign prostatic hypertrophy)   . Diabetes mellitus 2000   type 2  . Diabetes mellitus type 2 in nonobese [facility]) 06/19/2009   Qualifier: Diagnosis of  By: Wynona Luna    . GERD (gastroesophageal reflux disease)   . Glaucoma    followed by Dr Lanell Matar at [facility]  . History of colonic polyps 12/04/2014  . Hyperlipidemia   . Medicare annual wellness visit, subsequent 12/31/2015  . Onychomycosis 09/17/2015  . Vasomotor rhinitis 05/02/2013    Past Surgical History:  Procedure Laterality Date  . APPENDECTOMY  1943  . CYSTOSCOPY WITH INSERTION OF UROLIFT    . EYE SURGERY     laser surgery on left numerous times.   . TRABECULECTOMY Left 09/04/2016    Family History  Problem Relation Age of Onset  . Cancer Sister        breast  . Obstructive Sleep Apnea Sister   . Heart disease Father        MI  . Diabetes Father   . Arthritis Brother        knees  . Heart disease Brother        a fib  . Colon cancer Paternal Uncle 52    Social History   Socioeconomic History  . Marital status: Married    Spouse name: Mary  . Number of children: 0  . Years of education: Not on file  . Highest education level: Not on file  Occupational History  . 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 Financial resource strain: Not on file  . Food insecurity:    Worry: Not on file    Inability: Not on file  . Transportation needs:    Medical: Not on file    Non-medical:  Not on file  Tobacco Use  . Smoking status: Never Smoker  . Smokeless tobacco: Never Used  Substance and Sexual Activity  . Alcohol use: Yes    Alcohol/week: 0.0 standard drinks    Comment: once monthly "maybe"  . Drug use: No  . Sexual activity: Yes    Comment: lives with wife, travels frequently, no major dietary restrictions. exercises regularly with aerobic, stretch and weights  Lifestyle  . Physical activity:    Days per week: Not on file    Minutes per session: Not on file  . Stress: Not on file  Relationships  . Social connections:    Talks on phone: Not on file    Gets together: Not on file    Attends religious service: Not on file    Active member of club or organization: Not on file    Attends meetings of clubs or organizations: Not on file    Relationship status: Not on file  . Intimate partner violence:    Fear of current or ex partner: Not on file    Emotionally abused: Not on file    Physically abused: Not on file  Forced sexual activity: Not on file  Other Topics Concern  . Not on file  Social History Narrative  . Not on file    Outpatient Medications Prior to Visit  Medication Sig Dispense Refill  . brimonidine-timolol (COMBIGAN) 0.2-0.5 % ophthalmic solution Place 1 drop into the right eye 2 (two) times daily.     . brinzolamide (AZOPT) 1 % ophthalmic suspension Place 1 drop into the right eye 3 (three) times daily.    Marland Kitchen glucose blood (IGLUCOSE TEST STRIPS) test strip Use as instructed. Dz: E11.9 100 each 12  . glyBURIDE (DIABETA) 2.5 MG tablet TAKE 1 TABLET DAILY WITH BREAKFAST 90 tablet 4  . ipratropium (ATROVENT) 0.03 % nasal spray Place 2 sprays into both nostrils 2 (two) times daily as needed for rhinitis. 90 mL 1  . JANUVIA 100 MG tablet TAKE 1 TABLET DAILY 90 tablet 2  . metFORMIN (GLUCOPHAGE) 1000 MG tablet Take 1 tablet twice a day and one-half tablet every noon 225 tablet 2  . ranitidine (ZANTAC) 150 MG tablet TAKE 1 TABLET DAILY 90 tablet 4    . RESTASIS 0.05 % ophthalmic emulsion Place 1 drop into both eyes 2 (two) times daily.     . simvastatin (ZOCOR) 10 MG tablet TAKE 1 TABLET AT BEDTIME 90 tablet 2  . travoprost, benzalkonium, (TRAVATAN) 0.004 % ophthalmic solution 1 drop at bedtime.      . valACYclovir (VALTREX) 1000 MG tablet Take 1 tablet (1,000 mg total) by mouth daily. 90 tablet 1  . vardenafil (LEVITRA) 20 MG tablet Take 1 tablet (20 mg total) by mouth daily as needed. 30 tablet 2  . WALGREENS LANCETS MISC Use as directed once daily to check blood sugar.  DX E11.9 100 each 6  . doxycycline (VIBRA-TABS) 100 MG tablet Take 1 tablet (100 mg total) by mouth 2 (two) times daily. Can give caps or generic 20 tablet 0  . HYDROcodone-homatropine (HYCODAN) 5-1.5 MG/5ML syrup Take 5 mLs by mouth every 6 (six) hours as needed for cough. 100 mL 0   No facility-administered medications prior to visit.     Allergies  Allergen Reactions  . Levaquin [Levofloxacin In D5w] Hives    Review of Systems  Constitutional: Positive for malaise/fatigue. Negative for fever.  HENT: Negative for congestion.   Eyes: Negative for blurred vision.  Respiratory: Negative for shortness of breath.   Cardiovascular: Negative for chest pain, palpitations and leg swelling.  Gastrointestinal: Negative for abdominal pain, blood in stool and nausea.  Genitourinary: Negative for dysuria and frequency.  Musculoskeletal: Negative for falls.  Skin: Negative for rash.  Neurological: Negative for dizziness, loss of consciousness and headaches.  Endo/Heme/Allergies: Negative for environmental allergies.  Psychiatric/Behavioral: Negative for depression. The patient is not nervous/anxious.        Objective:    Physical Exam Vitals signs and nursing note reviewed.  Constitutional:      General: He is not in acute distress.    Appearance: He is well-developed.  HENT:     Head: Normocephalic and atraumatic.     Nose: Nose normal.  Eyes:     General:         Right eye: No discharge.        Left eye: No discharge.  Neck:     Musculoskeletal: Normal range of motion and neck supple.  Cardiovascular:     Rate and Rhythm: Normal rate and regular rhythm.     Heart sounds: No murmur.  Pulmonary:  Effort: Pulmonary effort is normal.     Breath sounds: Normal breath sounds.  Abdominal:     General: Bowel sounds are normal.     Palpations: Abdomen is soft.     Tenderness: There is no abdominal tenderness.  Musculoskeletal:     Right lower leg: No edema.     Left lower leg: No edema.  Skin:    General: Skin is warm and dry.  Neurological:     Mental Status: He is alert and oriented to person, place, and time.     BP (!) 98/56 (BP Location: Left Arm, Cuff Size: Normal)   Pulse 70   Temp 97.6 F (36.4 C) (Oral)   Resp 16   Ht 6' (1.829 m)   Wt 174 lb 3.2 oz (79 kg)   SpO2 97%   BMI 23.63 kg/m  Wt Readings from Last 3 Encounters:  08/13/18 174 lb 3.2 oz (79 kg)  05/18/18 171 lb 12.8 oz (77.9 kg)  03/12/18 174 lb 12.8 oz (79.3 kg)     Lab Results  Component Value Date   WBC 8.1 08/06/2018   HGB 13.8 08/06/2018   HCT 41.5 08/06/2018   PLT 252.0 08/06/2018   GLUCOSE 168 (H) 08/06/2018   CHOL 172 08/06/2018   TRIG 108.0 08/06/2018   HDL 53.00 08/06/2018   LDLCALC 97 08/06/2018   ALT 15 08/06/2018   AST 13 08/06/2018   NA 137 08/06/2018   K 4.5 08/06/2018   CL 101 08/06/2018   CREATININE 1.20 08/06/2018   BUN 19 08/06/2018   CO2 28 08/06/2018   TSH 1.69 08/06/2018   PSA 6.05 (H) 12/03/2017   HGBA1C 6.6 (H) 08/06/2018   MICROALBUR <0.7 06/06/2016    Lab Results  Component Value Date   TSH 1.69 08/06/2018   Lab Results  Component Value Date   WBC 8.1 08/06/2018   HGB 13.8 08/06/2018   HCT 41.5 08/06/2018   MCV 92.9 08/06/2018   PLT 252.0 08/06/2018   Lab Results  Component Value Date   NA 137 08/06/2018   K 4.5 08/06/2018   CO2 28 08/06/2018   GLUCOSE 168 (H) 08/06/2018   BUN 19 08/06/2018    CREATININE 1.20 08/06/2018   BILITOT 0.5 08/06/2018   ALKPHOS 54 08/06/2018   AST 13 08/06/2018   ALT 15 08/06/2018   PROT 6.2 08/06/2018   ALBUMIN 4.1 08/06/2018   CALCIUM 9.2 08/06/2018   GFR 58.03 (L) 08/06/2018   Lab Results  Component Value Date   CHOL 172 08/06/2018   Lab Results  Component Value Date   HDL 53.00 08/06/2018   Lab Results  Component Value Date   LDLCALC 97 08/06/2018   Lab Results  Component Value Date   TRIG 108.0 08/06/2018   Lab Results  Component Value Date   CHOLHDL 3 08/06/2018   Lab Results  Component Value Date   HGBA1C 6.6 (H) 08/06/2018       Assessment & Plan:   Problem List Items Addressed This Visit    Diabetes mellitus type 2 in nonobese ([place])    hgba1c acceptable, minimize simple carbs. Increase exercise as tolerated. Continue current meds      Relevant Orders   Hemoglobin A1c   Comprehensive metabolic panel   TSH   Microalbumin / creatinine urine ratio   Hyperlipidemia - Primary    Encouraged heart healthy diet, increase exercise, avoid trans fats, consider a krill oil cap daily      Relevant Orders  Lipid panel   TSH   Back pain   Relevant Orders   CBC with Differential/Platelet   BPH (benign prostatic hyperplasia)    Asymptomatic, just had a recent biopsy with Dr Diona Fanti at [facility]. Will request last note         I have discontinued Iona Beard L. Kirschenmann's doxycycline and HYDROcodone-homatropine. I am also having him maintain his travoprost (benzalkonium), RESTASIS, brimonidine-timolol, WALGREENS LANCETS, brinzolamide, vardenafil, JANUVIA, simvastatin, ipratropium, valACYclovir, metFORMIN, ranitidine, glyBURIDE, and glucose blood.  No orders of the defined types were placed in this encounter.    Penni Homans, MD

## 2018-08-13 NOTE — Assessment & Plan Note (Signed)
Asymptomatic, just had a recent biopsy with Dr Diona Fanti at [facility]. Will request last note

## 2018-08-13 NOTE — Assessment & Plan Note (Signed)
Encouraged heart healthy diet, increase exercise, avoid trans fats, consider a krill oil cap daily 

## 2018-08-13 NOTE — Assessment & Plan Note (Signed)
hgba1c acceptable, minimize simple carbs. Increase exercise as tolerated. Continue current meds 

## 2018-08-16 ENCOUNTER — Telehealth: Payer: Self-pay | Admitting: *Deleted

## 2018-08-16 NOTE — Telephone Encounter (Signed)
Received Medical records from [facility] Dr. Diona Fanti; forwarded to provider/SLS 02/10

## 2018-09-03 ENCOUNTER — Encounter: Payer: Self-pay | Admitting: Gastroenterology

## 2018-10-12 ENCOUNTER — Telehealth: Payer: Self-pay | Admitting: Family Medicine

## 2018-10-12 NOTE — Telephone Encounter (Signed)
Please advise 

## 2018-10-12 NOTE — Telephone Encounter (Signed)
Please discontinue the Ranitidine and change him to Famotidine 20 mg po daily prn. Better than Tagamet.

## 2018-10-12 NOTE — Telephone Encounter (Signed)
Pt left a vm stating he was informed by [facility] that they are discontinuing ranitidine. Pt wanted it replaced with "tagamet" Someone might to clarify with patient it was hard to understand name of medication named on vm.

## 2018-10-14 MED ORDER — FAMOTIDINE 20 MG PO TABS: 20.0000 mg | ORAL_TABLET | Freq: Every day | ORAL | 1 refills | Status: DC | PRN

## 2018-10-14 NOTE — Telephone Encounter (Signed)
Sent medications in.

## 2018-10-25 ENCOUNTER — Telehealth: Payer: Self-pay | Admitting: *Deleted

## 2018-10-25 NOTE — Telephone Encounter (Signed)
Received Physician Orders//Diabetic Detailed Written Order from [facility]; forwarded to provider/SLS 04/20

## 2018-10-26 ENCOUNTER — Other Ambulatory Visit: Payer: Self-pay | Admitting: Urology

## 2018-10-26 DIAGNOSIS — R972 Elevated prostate specific antigen [PSA]: Secondary | ICD-10-CM

## 2018-10-27 ENCOUNTER — Ambulatory Visit: Payer: Medicare Other | Admitting: Gastroenterology

## 2018-11-02 ENCOUNTER — Ambulatory Visit (INDEPENDENT_AMBULATORY_CARE_PROVIDER_SITE_OTHER): Payer: Medicare Other | Admitting: Gastroenterology

## 2018-11-02 ENCOUNTER — Ambulatory Visit: Payer: Medicare Other | Admitting: Gastroenterology

## 2018-11-02 ENCOUNTER — Other Ambulatory Visit: Payer: Self-pay

## 2018-11-02 ENCOUNTER — Encounter: Payer: Self-pay | Admitting: Gastroenterology

## 2018-11-02 VITALS — Ht 72.0 in | Wt 165.0 lb

## 2018-11-02 DIAGNOSIS — Z86010 Personal history of colonic polyps: Secondary | ICD-10-CM | POA: Diagnosis not present

## 2018-11-02 NOTE — Progress Notes (Signed)
History of Present Illness: This is an 84 year old male referred by Mosie Lukes, MD for the evaluation of a personal history an advanced colonic tubular adenoma.  A 2.5 cm adenomatous polyp in the ascending colon was removed by piecemeal polypectomy in May 2016 with repeat colonoscopy in January 2017 confirming a complete polypectomy. 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  No gastrointestinal complaints.  He is anxious to proceed with his surveillance colonoscopy which was due in January.  He relates an elevation in his PSA that is being evaluated by Dr. Diona Fanti.  Apparently an MRI is planned for July and a prostate biopsy planned for August.  He would like to complete his colonoscopy in advance of his urologic evaluation. Denies weight loss, abdominal pain, constipation, diarrhea, change in stool caliber, melena, hematochezia, nausea, vomiting, dysphagia, reflux symptoms, chest pain.     Allergies  Allergen Reactions  . Levaquin [Levofloxacin In D5w] Hives   Outpatient Medications Prior to Visit  Medication Sig Dispense Refill  . acetaminophen (TYLENOL) 650 MG CR tablet Take 650 mg by mouth every 8 (eight) hours as needed for pain.    . brimonidine-timolol (COMBIGAN) 0.2-0.5 % ophthalmic solution Place 1 drop into the right eye 2 (two) times daily.     . brinzolamide (AZOPT) 1 % ophthalmic suspension Place 1 drop into the right eye 3 (three) times daily.    Marland Kitchen glucose blood (IGLUCOSE TEST STRIPS) test strip Use as instructed. Dz: E11.9 100 each 12  . glyBURIDE (DIABETA) 2.5 MG tablet TAKE 1 TABLET DAILY WITH BREAKFAST 90 tablet 4  . ipratropium (ATROVENT) 0.03 % nasal spray Place 2 sprays into both nostrils 2 (two) times daily as needed for rhinitis. 90 mL 1  . JANUVIA 100 MG tablet TAKE 1 TABLET DAILY 90 tablet 2  . metFORMIN (GLUCOPHAGE) 1000 MG tablet Take 1 tablet twice a day and one-half tablet every noon 225 tablet  2  . ranitidine (ZANTAC) 150 MG tablet TAKE 1 TABLET DAILY 90 tablet 4  . RESTASIS 0.05 % ophthalmic emulsion Place 1 drop into both eyes 2 (two) times daily.     . simvastatin (ZOCOR) 10 MG tablet TAKE 1 TABLET AT BEDTIME 90 tablet 2  . travoprost, benzalkonium, (TRAVATAN) 0.004 % ophthalmic solution 1 drop at bedtime.      . valACYclovir (VALTREX) 1000 MG tablet Take 1 tablet (1,000 mg total) by mouth daily. 90 tablet 1  . vardenafil (LEVITRA) 20 MG tablet Take 1 tablet (20 mg total) by mouth daily as needed. 30 tablet 2  . WALGREENS LANCETS MISC Use as directed once daily to check blood sugar.  DX E11.9 100 each 6  . famotidine (PEPCID) 20 MG tablet Take 1 tablet (20 mg total) by mouth daily as needed for heartburn or indigestion. (Patient not taking: Reported on 11/02/2018) 90 tablet 1   No facility-administered medications prior to visit.    Past Medical History:  Diagnosis Date  . BPH (benign prostatic hypertrophy)   . Diabetes mellitus 2000   type 2  . Diabetes mellitus type 2 in nonobese [facility]) 06/19/2009   Qualifier: Diagnosis of  By: Wynona Luna    . GERD (gastroesophageal reflux disease)   . Glaucoma    followed by Dr Lanell Matar at [facility]  . Hyperlipidemia   . Medicare annual wellness visit, subsequent 12/31/2015  . Onychomycosis 09/17/2015  . Tubular adenoma of colon 2003  .  Vasomotor rhinitis 05/02/2013   Past Surgical History:  Procedure Laterality Date  . APPENDECTOMY  1943  . CYSTOSCOPY WITH INSERTION OF UROLIFT    . EYE SURGERY     laser surgery on left numerous times.   . TRABECULECTOMY Left 09/04/2016   Social History   Socioeconomic History  . Marital status: Married    Spouse name: Mary  . Number of children: 0  . Years of education: Not on file  . Highest education level: Not on file  Occupational History  . 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 Financial resource strain: Not on file  . Food  insecurity:    Worry: Not on file    Inability: Not on file  . Transportation needs:    Medical: Not on file    Non-medical: Not on file  Tobacco Use  . Smoking status: Never Smoker  . Smokeless tobacco: Never Used  Substance and Sexual Activity  . Alcohol use: Yes    Alcohol/week: 0.0 standard drinks    Comment: once monthly "maybe"  . Drug use: No  . Sexual activity: Yes    Comment: lives with wife, travels frequently, no major dietary restrictions. exercises regularly with aerobic, stretch and weights  Lifestyle  . Physical activity:    Days per week: Not on file    Minutes per session: Not on file  . Stress: Not on file  Relationships  . Social connections:    Talks on phone: Not on file    Gets together: Not on file    Attends religious service: Not on file    Active member of club or organization: Not on file    Attends meetings of clubs or organizations: Not on file    Relationship status: Not on file  Other Topics Concern  . Not on file  Social History Narrative  . Not on file   Family History  Problem Relation Age of Onset  . Cancer Sister        breast  . Obstructive Sleep Apnea Sister   . Heart disease Father        MI  . Diabetes Father   . Arthritis Brother        knees  . Heart disease Brother        a fib  . Colon cancer Paternal Uncle 36      Review of Systems: Pertinent positive and negative review of systems were noted in the above HPI section. All other review of systems were otherwise negative.    Physical Exam: Telemedicine - not performed   Assessment and Recommendations:  1.  Personal history of an advanced tubular adenoma measuring 2.5 cm removed by piecemeal polypectomy in May 2016. Colonoscopy in January 2017 confirmed a complete polypectomy.  He is about 4 months overdue for his 3 year interval surveillance colonoscopy.  He would like to proceed with colonoscopy in the next several weeks, ahead of his prostate evaluation.  As we  anticipate COVID-19 scheduling restrictions to be eased in mid May we will plan to schedule his procedure then. The scheduling could change and he understands. The risks (including bleeding, perforation, infection, missed lesions, medication reactions and possible hospitalization or surgery if complications occur), benefits, and alternatives to colonoscopy with possible biopsy and possible polypectomy were discussed with the patient and they consent to proceed.   2. Gastroesophageal reflux disease.  Advised to follow standard antireflux measures and  continue Pepcid 20 mg daily as needed.  Advised to discontinue use of Zantac.  3. Elevated PSA with further evaluation planned for July, August.    These services were provided via telemedicine, audio only per patient request.  The patient was at home and the provider was in the office, alone.  We discussed the limitations of evaluation and management by telemedicine and the availability of in person appointments.  Patient consented for this telemedicine visit and is aware of possible charges for this service.  The other person participating in the telemedicine service was Marlon Pel, [place] who reviewed medications, allergies, past history and completed AVS.  Time spent on call: 8 minutes    cc: Mosie Lukes, MD [place], [place] [id number]

## 2018-11-02 NOTE — Patient Instructions (Signed)
You have been scheduled for a Colonoscopy on 11/16/18 and your telephone nurse visit is on 11/08/18.

## 2018-11-08 ENCOUNTER — Other Ambulatory Visit: Payer: Self-pay

## 2018-11-08 ENCOUNTER — Ambulatory Visit: Payer: Medicare Other | Admitting: *Deleted

## 2018-11-08 VITALS — Ht 72.0 in | Wt 165.0 lb

## 2018-11-08 DIAGNOSIS — Z86010 Personal history of colonic polyps: Secondary | ICD-10-CM

## 2018-11-08 MED ORDER — NA SULFATE-K SULFATE-MG SULF 17.5-3.13-1.6 GM/177ML PO SOLN: 1.0000 | Freq: Once | ORAL | 0 refills | Status: AC

## 2018-11-08 NOTE — Progress Notes (Signed)
Pt mailed instruction packet to included paper to complete and mail back to [facility] with addressed and stamped envelope, Emmi video, copy of consent form to read and not return, and instructions. PV completed over the phone. Pt encouraged to call with questions or issues   No egg or soy allergy known to patient  No issues with past sedation with any surgeries  or procedures, no intubation problems  No diet pills per patient No home 02 use per patient  No blood thinners per patient  Pt denies issues with constipation  No A fib or A flutter  EMMI video sent to pt's e mail

## 2018-11-13 ENCOUNTER — Other Ambulatory Visit: Payer: Self-pay | Admitting: Family Medicine

## 2018-11-15 ENCOUNTER — Telehealth: Payer: Self-pay | Admitting: *Deleted

## 2018-11-15 NOTE — Telephone Encounter (Signed)
Returned call to patient to complete Covid screening questions prior to procedure tomorrow. Asked him to call [phone].

## 2018-11-15 NOTE — Telephone Encounter (Signed)
LMOM to confirm appt and for pt to call back to go over screening questions

## 2018-11-15 NOTE — Telephone Encounter (Signed)
Covid-19 travel screening questions  Have you traveled in the last 14 days? No If yes where?  Do you now or have you had a fever in the last 14 days? No  Do you have any respiratory symptoms of shortness of breath or cough now or in the last 14 days? No  Do you have any family members or close contacts with diagnosed or suspected Covid-19? No       

## 2018-11-15 NOTE — Telephone Encounter (Signed)
[phone]   Pt is returning your call

## 2018-11-16 ENCOUNTER — Ambulatory Visit (AMBULATORY_SURGERY_CENTER): Payer: Medicare Other | Admitting: Gastroenterology

## 2018-11-16 ENCOUNTER — Other Ambulatory Visit: Payer: Self-pay

## 2018-11-16 ENCOUNTER — Encounter: Payer: Self-pay | Admitting: Gastroenterology

## 2018-11-16 VITALS — BP 104/62 | HR 62 | Temp 97.4°F | Resp 11 | Ht 72.0 in | Wt 165.0 lb

## 2018-11-16 DIAGNOSIS — D123 Benign neoplasm of transverse colon: Secondary | ICD-10-CM | POA: Diagnosis not present

## 2018-11-16 DIAGNOSIS — D124 Benign neoplasm of descending colon: Secondary | ICD-10-CM | POA: Diagnosis not present

## 2018-11-16 DIAGNOSIS — Z86010 Personal history of colonic polyps: Secondary | ICD-10-CM | POA: Diagnosis not present

## 2018-11-16 DIAGNOSIS — Z860101 Personal history of adenomatous and serrated colon polyps: Secondary | ICD-10-CM

## 2018-11-16 MED ORDER — SODIUM CHLORIDE 0.9 % IV SOLN: 500.0000 mL | Freq: Once | INTRAVENOUS | Status: DC

## 2018-11-16 NOTE — Progress Notes (Signed)
Pt's states no medical or surgical changes since previsit or office visit. 

## 2018-11-16 NOTE — Patient Instructions (Signed)
YOU HAD AN ENDOSCOPIC PROCEDURE TODAY AT [facility]:   Refer to the procedure report that was given to you for any specific questions about what was found during the examination.  If the procedure report does not answer your questions, please call your gastroenterologist to clarify.  If you requested that your care partner not be given the details of your procedure findings, then the procedure report has been included in a sealed envelope for you to review at your convenience later.  YOU SHOULD EXPECT: Some feelings of bloating in the abdomen. Passage of more gas than usual.  Walking can help get rid of the air that was put into your GI tract during the procedure and reduce the bloating. If you had a lower endoscopy (such as a colonoscopy or flexible sigmoidoscopy) you may notice spotting of blood in your stool or on the toilet paper. If you underwent a bowel prep for your procedure, you may not have a normal bowel movement for a few days.  Please Note:  You might notice some irritation and congestion in your nose or some drainage.  This is from the oxygen used during your procedure.  There is no need for concern and it should clear up in a day or so.  SYMPTOMS TO REPORT IMMEDIATELY:   Following lower endoscopy (colonoscopy or flexible sigmoidoscopy):  Excessive amounts of blood in the stool  Significant tenderness or worsening of abdominal pains  Swelling of the abdomen that is new, acute  Fever of 100F or higher   For urgent or emergent issues, a gastroenterologist can be reached at any hour by calling [phone].   DIET:  We do recommend a small meal at first, but then you may proceed to your regular diet.  Drink plenty of fluids but you should avoid alcoholic beverages for 24 hours.  Try to eat a high fiber diet, and drink plenty of water.  ACTIVITY:  You should plan to take it easy for the rest of today and you should NOT DRIVE or use heavy machinery until tomorrow  (because of the sedation medicines used during the test).    FOLLOW UP: Our staff will call the number listed on your records the next business day following your procedure to check on you and address any questions or concerns that you may have regarding the information given to you following your procedure. If we do not reach you, we will leave a message.  However, if you are feeling well and you are not experiencing any problems, there is no need to return our call.  We will assume that you have returned to your regular daily activities without incident. We will be calling you two weeks following your procedure to see if you have developed any symptoms of the COVID-19.  If you develop any symptoms before then, please let us know.  If any biopsies were taken you will be contacted by phone or by letter within the next 1-3 weeks.  Please call us at [phone] if you have not heard about the biopsies in 3 weeks.    SIGNATURES/CONFIDENTIALITY: You and/or your care partner have signed paperwork which will be entered into your electronic medical record.  These signatures attest to the fact that that the information above on your After Visit Summary has been reviewed and is understood.  Full responsibility of the confidentiality of this discharge information lies with you and/or your care-partner.  Read all of the handouts given yo you by  your recovery room nurse.

## 2018-11-16 NOTE — Op Note (Signed)
[place] Patient Name: [name] Procedure Date: 11/16/2018 8:24 AM MRN: [id number] Endoscopist: Ladene Artist , MD Age: 84 Referring MD:  Date of Birth: Mar 22, 1935 Gender: Male Account #: [id number] Procedure:                Colonoscopy Indications:              High risk colon cancer surveillance: Personal                            history of adenoma (10 mm or greater in size), High                            risk colon cancer surveillance: Personal history of                            adenoma with high grade dysplasia, High risk colon                            cancer surveillance: Personal history of adenoma                            with villous component Medicines:                Monitored Anesthesia Care Procedure:                Pre-Anesthesia Assessment:                           - Prior to the procedure, a History and Physical                            was performed, and patient medications and                            allergies were reviewed. The patient's tolerance of                            previous anesthesia was also reviewed. The risks                            and benefits of the procedure and the sedation                            options and risks were discussed with the patient.                            All questions were answered, and informed consent                            was obtained. Prior Anticoagulants: The patient has                            taken no previous anticoagulant or antiplatelet  agents. ASA Grade Assessment: II - A patient with                            mild systemic disease. After reviewing the risks                            and benefits, the patient was deemed in                            satisfactory condition to undergo the procedure.                           After obtaining informed consent, the colonoscope                            was passed under direct vision.  Throughout the                            procedure, the patient's blood pressure, pulse, and                            oxygen saturations were monitored continuously. The                            Colonoscope was introduced through the anus and                            advanced to the the cecum, identified by                            appendiceal orifice and ileocecal valve. The                            ileocecal valve, appendiceal orifice, and rectum                            were photographed. The quality of the bowel                            preparation was good after extensive lavage and                            suctioning. The colonoscopy was performed without                            difficulty. The patient tolerated the procedure                            well. Scope In: 8:41:40 AM Scope Out: 8:59:50 AM Scope Withdrawal Time: 0 hours 16 minutes 4 seconds  Total Procedure Duration: 0 hours 18 minutes 10 seconds  Findings:                 The perianal and digital rectal examinations were  normal.                           Two sessile polyps were found in the descending                            colon and transverse colon. The polyps were 7 to 10                            mm in size. These polyps were removed with a cold                            snare. Resection and retrieval were complete.                           Multiple medium-mouthed diverticula were found in                            the left colon. There was no evidence of                            diverticular bleeding.                           Internal hemorrhoids were found during                            retroflexion. The hemorrhoids were small and Grade                            I (internal hemorrhoids that do not prolapse).                           The exam was otherwise without abnormality on                            direct and retroflexion views. Complications:             No immediate complications. Estimated blood loss:                            None. Estimated Blood Loss:     Estimated blood loss: none. Impression:               - Two 7 to 10 mm polyps in the descending colon and                            in the transverse colon, removed with a cold snare.                            Resected and retrieved.                           - Moderate diverticulosis in the left colon. There  was no evidence of diverticular bleeding.                           - Internal hemorrhoids.                           - The examination was otherwise normal on direct                            and retroflexion views. Recommendation:           - Repeat colonoscopy in 2-3 years for surveillance                            if health status appropriate.                           - Patient has a contact number available for                            emergencies. The signs and symptoms of potential                            delayed complications were discussed with the                            patient. Return to normal activities tomorrow.                            Written discharge instructions were provided to the                            patient.                           - High fiber diet.                           - Continue present medications.                           - Await pathology results. Ladene Artist, MD 11/16/2018 9:06:07 AM This report has been signed electronically.

## 2018-11-16 NOTE — Progress Notes (Signed)
Called to room to assist during endoscopic procedure.  Patient ID and intended procedure confirmed with present staff. Received instructions for my participation in the procedure from the performing physician.  

## 2018-11-16 NOTE — Progress Notes (Signed)
Report given to PACU, vss 

## 2018-11-18 ENCOUNTER — Telehealth: Payer: Self-pay | Admitting: *Deleted

## 2018-11-18 NOTE — Telephone Encounter (Signed)
  Follow up Call-  Call back number 11/16/2018  Post procedure Call Back phone  # [phone]  Permission to leave phone message Yes  Some recent data might be hidden    Called pt at [phone]  Patient questions:  Do you have a fever, pain , or abdominal swelling? No. Pain Score  0 *  Have you tolerated food without any problems? Yes.    Have you been able to return to your normal activities? Yes.    Do you have any questions about your discharge instructions: Diet   No. Medications  No. Follow up visit  No.  Do you have questions or concerns about your Care? No.  Actions: * If pain score is 4 or above: No action needed, pain <4.   1. Have you developed a fever since your procedure? no  2.   Have you had an respiratory symptoms (SOB or cough) since your procedure? no  3.   Have you tested positive for COVID 19 since your procedure no  3.   Have you had any family members/close contacts diagnosed with the COVID 19 since your procedure?  no   If any of these questions are a yes, please inquire if patient has been seen by family doctor and route this note to Joylene John, Therapist, sports.

## 2018-11-23 ENCOUNTER — Encounter: Payer: Self-pay | Admitting: Gastroenterology

## 2018-12-11 ENCOUNTER — Other Ambulatory Visit: Payer: Self-pay | Admitting: Family Medicine

## 2019-01-11 ENCOUNTER — Other Ambulatory Visit: Payer: Self-pay | Admitting: Family Medicine

## 2019-01-19 ENCOUNTER — Ambulatory Visit
Admission: RE | Admit: 2019-01-19 | Discharge: 2019-01-19 | Disposition: A | Discharge status: Home / Self Care | Payer: Medicare Other | Source: Ambulatory Visit | Attending: Urology | Admitting: Urology

## 2019-01-19 ENCOUNTER — Other Ambulatory Visit: Payer: Self-pay

## 2019-01-19 DIAGNOSIS — R972 Elevated prostate specific antigen [PSA]: Secondary | ICD-10-CM

## 2019-01-19 MED ORDER — GADOBENATE DIMEGLUMINE 529 MG/ML IV SOLN
15.0000 mL | Freq: Once | INTRAVENOUS | Status: AC | PRN
  Administered 2019-01-19: 15 mL via INTRAVENOUS

## 2019-02-11 ENCOUNTER — Other Ambulatory Visit (INDEPENDENT_AMBULATORY_CARE_PROVIDER_SITE_OTHER): Payer: Medicare Other

## 2019-02-11 ENCOUNTER — Other Ambulatory Visit: Payer: Self-pay

## 2019-02-11 DIAGNOSIS — E782 Mixed hyperlipidemia: Secondary | ICD-10-CM | POA: Diagnosis not present

## 2019-02-11 DIAGNOSIS — M549 Dorsalgia, unspecified: Secondary | ICD-10-CM

## 2019-02-11 DIAGNOSIS — E119 Type 2 diabetes mellitus without complications: Secondary | ICD-10-CM

## 2019-02-11 LAB — COMPREHENSIVE METABOLIC PANEL
ALT: 17 U/L (ref 0–53)
AST: 16 U/L (ref 0–37)
Albumin: 4.4 g/dL (ref 3.5–5.2)
Alkaline Phosphatase: 49 U/L (ref 39–117)
BUN: 23 mg/dL (ref 6–23)
CO2: 26 mEq/L (ref 19–32)
Calcium: 9.4 mg/dL (ref 8.4–10.5)
Chloride: 104 mEq/L (ref 96–112)
Creatinine, Ser: 1.26 mg/dL (ref 0.40–1.50)
GFR: 54.78 mL/min — ABNORMAL LOW (ref >60.00)
Glucose, Bld: 144 mg/dL — ABNORMAL HIGH (ref 70–99)
Potassium: 4.9 mEq/L (ref 3.5–5.1)
Sodium: 139 mEq/L (ref 135–145)
Total Bilirubin: 0.5 mg/dL (ref 0.2–1.2)
Total Protein: 6.4 g/dL (ref 6.0–8.3)

## 2019-02-11 LAB — TSH: TSH: 1.75 u[IU]/mL (ref 0.35–4.50)

## 2019-02-11 LAB — CBC WITH DIFFERENTIAL/PLATELET
Basophils Absolute: 0 10*3/uL (ref 0.0–0.1)
Basophils Relative: 0.6 % (ref 0.0–3.0)
Eosinophils Absolute: 0.4 10*3/uL (ref 0.0–0.7)
Eosinophils Relative: 5.3 % — ABNORMAL HIGH (ref 0.0–5.0)
HCT: 39.4 % (ref 39.0–52.0)
Hemoglobin: 13.3 g/dL (ref 13.0–17.0)
Lymphocytes Relative: 20.8 % (ref 12.0–46.0)
Lymphs Abs: 1.5 10*3/uL (ref 0.7–4.0)
MCHC: 33.7 g/dL (ref 30.0–36.0)
MCV: 93.1 fl (ref 78.0–100.0)
Monocytes Absolute: 0.8 10*3/uL (ref 0.1–1.0)
Monocytes Relative: 11.3 % (ref 3.0–12.0)
Neutro Abs: 4.4 10*3/uL (ref 1.4–7.7)
Neutrophils Relative %: 62 % (ref 43.0–77.0)
Platelets: 231 10*3/uL (ref 150.0–400.0)
RBC: 4.23 Mil/uL (ref 4.22–5.81)
RDW: 13.7 % (ref 11.5–15.5)
WBC: 7.1 10*3/uL (ref 4.0–10.5)

## 2019-02-11 LAB — LIPID PANEL
Cholesterol: 166 mg/dL (ref 0–200)
HDL: 51.5 mg/dL (ref >39.00)
LDL Cholesterol: 89 mg/dL (ref 0–99)
NonHDL: 114.41
Total CHOL/HDL Ratio: 3
Triglycerides: 125 mg/dL (ref 0.0–149.0)
VLDL: 25 mg/dL (ref 0.0–40.0)

## 2019-02-11 LAB — MICROALBUMIN / CREATININE URINE RATIO
Creatinine,U: 80.6 mg/dL
Microalb Creat Ratio: 0.9 mg/g (ref 0.0–30.0)
Microalb, Ur: <0.7 mg/dL (ref 0.0–1.9)

## 2019-02-11 LAB — HEMOGLOBIN A1C: Hgb A1c MFr Bld: 6.7 % — ABNORMAL HIGH (ref 4.6–6.5)

## 2019-02-18 ENCOUNTER — Encounter: Payer: Self-pay | Admitting: Family Medicine

## 2019-02-18 ENCOUNTER — Telehealth: Payer: Self-pay | Admitting: Family Medicine

## 2019-02-18 ENCOUNTER — Ambulatory Visit (INDEPENDENT_AMBULATORY_CARE_PROVIDER_SITE_OTHER): Payer: Medicare Other | Admitting: Family Medicine

## 2019-02-18 VITALS — Wt 166.0 lb

## 2019-02-18 DIAGNOSIS — E119 Type 2 diabetes mellitus without complications: Secondary | ICD-10-CM

## 2019-02-18 DIAGNOSIS — M25552 Pain in left hip: Secondary | ICD-10-CM

## 2019-02-18 DIAGNOSIS — M79642 Pain in left hand: Secondary | ICD-10-CM | POA: Diagnosis not present

## 2019-02-18 DIAGNOSIS — M79645 Pain in left finger(s): Secondary | ICD-10-CM | POA: Diagnosis not present

## 2019-02-18 DIAGNOSIS — E782 Mixed hyperlipidemia: Secondary | ICD-10-CM

## 2019-02-18 DIAGNOSIS — R972 Elevated prostate specific antigen [PSA]: Secondary | ICD-10-CM

## 2019-02-18 NOTE — Telephone Encounter (Signed)
Called to schedule Return in about 6 months (around 08/21/2019). Patient inquired about lab work and wanted it ordered so he could have the results in for his next appointment with Dr. Charlett Blake. Please advise.

## 2019-02-18 NOTE — Assessment & Plan Note (Signed)
hgba1c acceptable, minimize simple carbs. Increase exercise as tolerated. Continue current meds 

## 2019-02-18 NOTE — Assessment & Plan Note (Signed)
Has struggled with pain and devility in left hip intermittently for years but has worsened recently with crepitus and pain. Is requesting referral to Dr Alvan Dame for evaluation at [facility] referral placed

## 2019-02-18 NOTE — Progress Notes (Signed)
Virtual Visit via Video Note  I connected with [name] on 02/18/19 at  9:00 AM EDT by a video enabled telemedicine application and verified that I am speaking with the correct person using two identifiers.  Location: Patient: home Provider: home   I discussed the limitations of evaluation and management by telemedicine and the availability of in person appointments. The patient expressed understanding and agreed to proceed. [name], CMA was able to get patient set up with visit, video   Subjective:    Patient ID: [name], male    DOB: 21-Feb-1935, 84 y.o.   MRN: [id number]  Chief Complaint  Patient presents with  . Follow-up    HPI Patient is in today for follow up on chronic medical concerns including diabetes, hypertension and hip pain. He feels well today. No recent febrile illness or hospitalizations. No polyuria or polydipsia. Is struggling with increasing left hip pain and and crepitus.  No fall, injury or incontinence but it is bad enough that I he is willing to accept a referral at this time. Is quarantining well and trying eat well and stay active. He is also noting increased pain in left hand, index finger, PIP joint. Describes the pain as burning. Denies CP/palp/SOB/HA/congestion/fevers/GI or GU c/o. Taking meds as prescribed  Past Medical History:  Diagnosis Date  . Allergy    mild  . Arthritis    fingers, hip  . BPH (benign prostatic hypertrophy)   . Cataract    removed both eyes  . Diabetes mellitus 2000   type 2  . Diabetes mellitus type 2 in nonobese [facility]) 06/19/2009   Qualifier: Diagnosis of  By: Wynona Luna    . GERD (gastroesophageal reflux disease)   . Glaucoma    followed by Dr Lanell Matar at [name]  . Hyperlipidemia   . Left hip pain 03/14/2012  . Medicare annual wellness visit, subsequent 12/31/2015  . Onychomycosis 09/17/2015  . Tubular adenoma of colon 2003  . Vasomotor rhinitis 05/02/2013    Past Surgical  History:  Procedure Laterality Date  . APPENDECTOMY  1943  . CATARACT EXTRACTION, BILATERAL    . COLONOSCOPY    . CYSTOSCOPY WITH INSERTION OF UROLIFT    . EYE SURGERY     laser surgery on left numerous times.   Marland Kitchen POLYPECTOMY    . TRABECULECTOMY Left 09/04/2016    Family History  Problem Relation Age of Onset  . Cancer Sister        breast  . Obstructive Sleep Apnea Sister   . Heart disease Father        MI  . Diabetes Father   . Arthritis Brother        knees  . Heart disease Brother        a fib  . Colon cancer Paternal Uncle 87  . Colon polyps Neg Hx   . Esophageal cancer Neg Hx   . Rectal cancer Neg Hx   . Stomach cancer Neg Hx     Social History   Socioeconomic History  . Marital status: Married    Spouse name: [name]  . Number of children: 0  . Years of education: Not on file  . Highest education level: Not on file  Occupational History  . 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 Financial resource strain: Not on file  . Food insecurity    Worry: Not on file  Inability: Not on file  . Transportation needs    Medical: Not on file    Non-medical: Not on file  Tobacco Use  . Smoking status: Never Smoker  . Smokeless tobacco: Never Used  Substance and Sexual Activity  . Alcohol use: Yes    Alcohol/week: 0.0 standard drinks    Comment: once monthly "maybe"  . Drug use: No  . Sexual activity: Yes    Comment: lives with wife, travels frequently, no major dietary restrictions. exercises regularly with aerobic, stretch and weights  Lifestyle  . Physical activity    Days per week: Not on file    Minutes per session: Not on file  . Stress: Not on file  Relationships  . Social Herbalist on phone: Not on file    Gets together: Not on file    Attends religious service: Not on file    Active member of club or organization: Not on file    Attends meetings of clubs or organizations: Not on file     Relationship status: Not on file  . Intimate partner violence    Fear of current or ex partner: Not on file    Emotionally abused: Not on file    Physically abused: Not on file    Forced sexual activity: Not on file  Other Topics Concern  . Not on file  Social History Narrative  . Not on file    Outpatient Medications Prior to Visit  Medication Sig Dispense Refill  . acetaminophen (TYLENOL) 650 MG CR tablet Take 650 mg by mouth every 8 (eight) hours as needed for pain.    . brimonidine-timolol (COMBIGAN) 0.2-0.5 % ophthalmic solution Place 1 drop into the right eye 2 (two) times daily.     . brinzolamide (AZOPT) 1 % ophthalmic suspension Place 1 drop into the right eye 3 (three) times daily.    Marland Kitchen glyBURIDE (DIABETA) 2.5 MG tablet TAKE 1 TABLET DAILY WITH BREAKFAST 90 tablet 4  . ipratropium (ATROVENT) 0.03 % nasal spray Place 2 sprays into both nostrils 2 (two) times daily as needed for rhinitis. 90 mL 1  . JANUVIA 100 MG tablet TAKE 1 TABLET DAILY 90 tablet 3  . metFORMIN (GLUCOPHAGE) 1000 MG tablet Take 1 tablet twice a day and one-half tablet every noon 225 tablet 2  . RESTASIS 0.05 % ophthalmic emulsion Place 1 drop into both eyes 2 (two) times daily.     . simvastatin (ZOCOR) 10 MG tablet TAKE 1 TABLET AT BEDTIME 90 tablet 3  . travoprost, benzalkonium, (TRAVATAN) 0.004 % ophthalmic solution 1 drop at bedtime.      . TRUE METRIX BLOOD GLUCOSE TEST test strip TEST EVERY DAY 100 strip 99  . valACYclovir (VALTREX) 1000 MG tablet Take 1 tablet (1,000 mg total) by mouth daily. 90 tablet 1  . vardenafil (LEVITRA) 20 MG tablet Take 1 tablet (20 mg total) by mouth daily as needed. 30 tablet 2  . [facility] LANCETS MISC Use as directed once daily to check blood sugar.  DX E11.9 100 each 6  . famotidine (PEPCID) 20 MG tablet Take 1 tablet (20 mg total) by mouth daily as needed for heartburn or indigestion. (Patient not taking: Reported on 11/02/2018) 90 tablet 1   No facility-administered  medications prior to visit.     Allergies  Allergen Reactions  . Levaquin [Levofloxacin In D5w] Hives    Review of Systems  Constitutional: Negative for fever and malaise/fatigue.  HENT: Negative for congestion.  Eyes: Negative for blurred vision.  Respiratory: Negative for shortness of breath.   Cardiovascular: Negative for chest pain, palpitations and leg swelling.  Gastrointestinal: Negative for abdominal pain, blood in stool and nausea.  Genitourinary: Negative for dysuria and frequency.  Musculoskeletal: Negative for falls.  Skin: Negative for rash.  Neurological: Negative for dizziness, loss of consciousness and headaches.  Endo/Heme/Allergies: Negative for environmental allergies.  Psychiatric/Behavioral: Negative for depression. The patient is not nervous/anxious.        Objective:    Physical Exam Constitutional:      Appearance: Normal appearance. He is not ill-appearing.  HENT:     Head: Normocephalic and atraumatic.     Nose: Nose normal.  Eyes:     General:        Right eye: No discharge.        Left eye: No discharge.  Pulmonary:     Effort: Pulmonary effort is normal.  Neurological:     Mental Status: He is alert and oriented to person, place, and time.  Psychiatric:        Mood and Affect: Mood normal.        Behavior: Behavior normal.     Wt 166 lb (75.3 kg)   BMI 22.51 kg/m  Wt Readings from Last 3 Encounters:  02/18/19 166 lb (75.3 kg)  11/16/18 165 lb (74.8 kg)  11/08/18 165 lb (74.8 kg)    Diabetic Foot Exam - Simple   No data filed     Lab Results  Component Value Date   WBC 7.1 02/11/2019   HGB 13.3 02/11/2019   HCT 39.4 02/11/2019   PLT 231.0 02/11/2019   GLUCOSE 144 (H) 02/11/2019   CHOL 166 02/11/2019   TRIG 125.0 02/11/2019   HDL 51.50 02/11/2019   LDLCALC 89 02/11/2019   ALT 17 02/11/2019   AST 16 02/11/2019   NA 139 02/11/2019   K 4.9 02/11/2019   CL 104 02/11/2019   CREATININE 1.26 02/11/2019   BUN 23  02/11/2019   CO2 26 02/11/2019   TSH 1.75 02/11/2019   PSA 6.05 (H) 12/03/2017   HGBA1C 6.7 (H) 02/11/2019   MICROALBUR <0.7 02/11/2019    Lab Results  Component Value Date   TSH 1.75 02/11/2019   Lab Results  Component Value Date   WBC 7.1 02/11/2019   HGB 13.3 02/11/2019   HCT 39.4 02/11/2019   MCV 93.1 02/11/2019   PLT 231.0 02/11/2019   Lab Results  Component Value Date   NA 139 02/11/2019   K 4.9 02/11/2019   CO2 26 02/11/2019   GLUCOSE 144 (H) 02/11/2019   BUN 23 02/11/2019   CREATININE 1.26 02/11/2019   BILITOT 0.5 02/11/2019   ALKPHOS 49 02/11/2019   AST 16 02/11/2019   ALT 17 02/11/2019   PROT 6.4 02/11/2019   ALBUMIN 4.4 02/11/2019   CALCIUM 9.4 02/11/2019   GFR 54.78 (L) 02/11/2019   Lab Results  Component Value Date   CHOL 166 02/11/2019   Lab Results  Component Value Date   HDL 51.50 02/11/2019   Lab Results  Component Value Date   LDLCALC 89 02/11/2019   Lab Results  Component Value Date   TRIG 125.0 02/11/2019   Lab Results  Component Value Date   CHOLHDL 3 02/11/2019   Lab Results  Component Value Date   HGBA1C 6.7 (H) 02/11/2019       Assessment & Plan:   Problem List Items Addressed This Visit    Diabetes  mellitus type 2 in nonobese (HCC)    hgba1c acceptable, minimize simple carbs. Increase exercise as tolerated. Continue current meds      Hyperlipidemia    Encouraged heart healthy diet, increase exercise, avoid trans fats, tolerating Simvastatin      PSA, INCREASED    Is now working with [facility], Dr Diona Fanti and is doing a biopsy on 8/24 after MRI was inconclusive.      Pain in finger of left hand    Patient reports roughly 6 month history of pain in left 4th finger PIP joint without injury. He is requesting referral to ortho, Dr Amedeo Plenty for eval. Referral placed      Left hip pain - Primary    Has struggled with pain and devility in left hip intermittently for years but has worsened recently with crepitus  and pain. Is requesting referral to Dr Alvan Dame for evaluation at [facility] referral placed      Relevant Orders   Ambulatory referral to Orthopedic Surgery    Other Visit Diagnoses    Left hand pain       Relevant Orders   Ambulatory referral to Orthopedic Surgery      I am having Iona Beard L. Wrage maintain his travoprost (benzalkonium), Restasis, brimonidine-timolol, [facility] Lancets, brinzolamide, vardenafil, ipratropium, valACYclovir, metFORMIN, glyBURIDE, famotidine, acetaminophen, Januvia, simvastatin, and True Metrix Blood Glucose Test.  No orders of the defined types were placed in this encounter.    I discussed the assessment and treatment plan with the patient. The patient was provided an opportunity to ask questions and all were answered. The patient agreed with the plan and demonstrated an understanding of the instructions.   The patient was advised to call back or seek an in-person evaluation if the symptoms worsen or if the condition fails to improve as anticipated.  I provided 25 minutes of non-face-to-face time during this encounter.   Penni Homans, MD

## 2019-02-18 NOTE — Assessment & Plan Note (Signed)
Is now working with [facility], Dr Diona Fanti and is doing a biopsy on 8/24 after MRI was inconclusive.

## 2019-02-18 NOTE — Progress Notes (Signed)
Needs ortho referral for left hip left hand

## 2019-02-18 NOTE — Assessment & Plan Note (Addendum)
Encouraged heart healthy diet, increase exercise, avoid trans fats, tolerating Simvastatin

## 2019-02-18 NOTE — Assessment & Plan Note (Signed)
Patient reports roughly 6 month history of pain in left 4th finger PIP joint without injury. He is requesting referral to ortho, Dr Amedeo Plenty for eval. Referral placed

## 2019-02-25 NOTE — Addendum Note (Signed)
Addended by: [name] A on: 02/25/2019 03:40 PM   Modules accepted: Orders

## 2019-02-25 NOTE — Telephone Encounter (Signed)
Labs ordered Please advise on lab appt

## 2019-03-15 ENCOUNTER — Ambulatory Visit
Admission: RE | Admit: 2019-03-15 | Discharge: 2019-03-15 | Disposition: A | Discharge status: Home / Self Care | Payer: Medicare Other | Source: Ambulatory Visit | Attending: Radiation Oncology | Admitting: Radiation Oncology

## 2019-03-15 ENCOUNTER — Other Ambulatory Visit: Payer: Self-pay

## 2019-03-15 ENCOUNTER — Encounter: Payer: Self-pay | Admitting: Radiation Oncology

## 2019-03-15 VITALS — Temp 97.2°F | Ht 72.0 in | Wt 170.0 lb

## 2019-03-15 DIAGNOSIS — C61 Malignant neoplasm of prostate: Secondary | ICD-10-CM

## 2019-03-15 HISTORY — DX: Malignant neoplasm of prostate: C61

## 2019-03-15 NOTE — Progress Notes (Signed)
See progress note under physician encounter. 

## 2019-03-15 NOTE — Progress Notes (Signed)
Radiation Oncology         [phone] ________________________________  Initial outpatient Consultation - Conducted via Telephone due to current COVID-19 concerns for limiting patient exposure  Name: ARBIE LISH MRN: [id number]  Date: 03/15/2019  DOB: 07/30/1934  [id number], Bonnita Levan, MD  Franchot Gallo, MD   REFERRING PHYSICIAN: Franchot Gallo, MD  DIAGNOSIS: 84 y.o. gentleman with Stage T1c adenocarcinoma of the prostate with Gleason score of 4+3, and PSA of 8.72.    ICD-10-CM   1. Malignant neoplasm of prostate ([place])  C61     HISTORY OF PRESENT ILLNESS: DENHAM WILDY is a 84 y.o. male with a diagnosis of prostate cancer. Of note, he has a history of BPH with BOO s/p Urolift procedure performed at [facility] in 2018. His LUTS improved at first but have worsened as time has gone on. He is an established patient with Dr. Diona Fanti. He was noted to have an elevated PSA of 6.05 in 11/2017, digital rectal examination performed at that time was normal. Repeat PSA in 10//2019 remained elevated at 6.78, prompting his initial biopsy which was performed on 06/14/2018, revealing atypia in 2/12 cores, suspicious for cribriform adenocarcinoma but no definite malignancy.   His PSA rose to 10.8 on 10/25/2018. Therefore, he underwent prostate MRI on 01/19/2019 for further evaluation, which showed no evidence of high-grade carcinoma in peripheral zone and only benign prostate hypertrophy. A repeat PSA on 01/25/2019 dropped slightly to 8.72 but remained elevated, prompting a second biopsy, which occurred on 02/28/2019.  The prostate volume measured 59 cc.  Out of 12 core biopsies, 2 were positive.  The maximum Gleason score was 4+3, and this was seen in left mid lateral and left apex lateral. HG/PIN was noted in right base lateral.  The patient reviewed the biopsy results with his urologist and he has kindly been referred today for discussion of potential radiation treatment options. He reports he has  also reached out to MD Ouida Sills for information regarding proton therapy.   PREVIOUS RADIATION THERAPY: No  PAST MEDICAL HISTORY:  Past Medical History:  Diagnosis Date   Allergy    mild   Arthritis    fingers, hip   BPH (benign prostatic hypertrophy)    Cataract    removed both eyes   Diabetes mellitus 2000   type 2   Diabetes mellitus type 2 in nonobese ([place]) 06/19/2009   Qualifier: Diagnosis of  By: Wynona Luna     GERD (gastroesophageal reflux disease)    Glaucoma    followed by Dr Lanell Matar at [facility]   Hyperlipidemia    Left hip pain 03/14/2012   Medicare annual wellness visit, subsequent 12/31/2015   Onychomycosis 09/17/2015   Prostate cancer ([place])    Tubular adenoma of colon 2003   Vasomotor rhinitis 05/02/2013      PAST SURGICAL HISTORY: Past Surgical History:  Procedure Laterality Date   APPENDECTOMY  1943   CATARACT EXTRACTION, BILATERAL     COLONOSCOPY     CYSTOSCOPY WITH INSERTION OF UROLIFT     EYE SURGERY     laser surgery on left numerous times.    POLYPECTOMY     TRABECULECTOMY Left 09/04/2016    FAMILY HISTORY:  Family History  Problem Relation Age of Onset   Obstructive Sleep Apnea Sister    Heart disease Father        MI   Diabetes Father    Arthritis Brother        knees  Heart disease Brother        a fib   Breast cancer Sister    Colon cancer Paternal Uncle 98   Colon polyps Neg Hx    Esophageal cancer Neg Hx    Rectal cancer Neg Hx    Stomach cancer Neg Hx    Prostate cancer Neg Hx     SOCIAL HISTORY:  Social History   Socioeconomic History   Marital status: Married    Spouse name: Sheridan   Number of children: 0   Years of education: Not on file   Highest education level: Not on file  Occupational History   Occupation: semi retired    Fish farm manager: Mining engineer: [place] resource strain: Not on file   Food insecurity    Worry: Not on file     Inability: Not on file   Transportation needs    Medical: Not on file    Non-medical: Not on file  Tobacco Use   Smoking status: Never Smoker   Smokeless tobacco: Never Used  Substance and Sexual Activity   Alcohol use: Yes    Alcohol/week: 0.0 standard drinks    Comment: once monthly "maybe"   Drug use: No   Sexual activity: Yes    Comment: lives with wife, travels frequently, no major dietary restrictions. exercises regularly with aerobic, stretch and weights  Lifestyle   Physical activity    Days per week: Not on file    Minutes per session: Not on file   Stress: Not on file  Relationships   Social connections    Talks on phone: Not on file    Gets together: Not on file    Attends religious service: Not on file    Active member of club or organization: Not on file    Attends meetings of clubs or organizations: Not on file    Relationship status: Not on file   Intimate partner violence    Fear of current or ex partner: Not on file    Emotionally abused: Not on file    Physically abused: Not on file    Forced sexual activity: Not on file  Other Topics Concern   Not on file  Social History Narrative   Not on file    ALLERGIES: Levaquin [levofloxacin in d5w]  MEDICATIONS:  Current Outpatient Medications  Medication Sig Dispense Refill   brimonidine-timolol (COMBIGAN) 0.2-0.5 % ophthalmic solution Place 1 drop into the right eye 2 (two) times daily.      brinzolamide (AZOPT) 1 % ophthalmic suspension Place 1 drop into the right eye 3 (three) times daily.     famotidine (PEPCID) 20 MG tablet Take 1 tablet (20 mg total) by mouth daily as needed for heartburn or indigestion. 90 tablet 1   glyBURIDE (DIABETA) 2.5 MG tablet TAKE 1 TABLET DAILY WITH BREAKFAST 90 tablet 4   ibuprofen (ADVIL) 800 MG tablet Take 800 mg by mouth every 8 (eight) hours as needed.     ipratropium (ATROVENT) 0.03 % nasal spray Place 2 sprays into both nostrils 2 (two) times daily  as needed for rhinitis. 90 mL 1   JANUVIA 100 MG tablet TAKE 1 TABLET DAILY 90 tablet 3   metFORMIN (GLUCOPHAGE) 1000 MG tablet Take 1 tablet twice a day and one-half tablet every noon 225 tablet 2   RESTASIS 0.05 % ophthalmic emulsion Place 1 drop into both eyes 2 (two) times daily.      simvastatin (  ZOCOR) 10 MG tablet TAKE 1 TABLET AT BEDTIME 90 tablet 3   travoprost, benzalkonium, (TRAVATAN) 0.004 % ophthalmic solution 1 drop at bedtime.       TRUE METRIX BLOOD GLUCOSE TEST test strip TEST EVERY DAY 100 strip 99   valACYclovir (VALTREX) 1000 MG tablet Take 1 tablet (1,000 mg total) by mouth daily. 90 tablet 1   [place] LANCETS MISC Use as directed once daily to check blood sugar.  DX E11.9 100 each 6   acetaminophen (TYLENOL) 650 MG CR tablet Take 650 mg by mouth every 8 (eight) hours as needed for pain.     vardenafil (LEVITRA) 20 MG tablet Take 1 tablet (20 mg total) by mouth daily as needed. (Patient not taking: Reported on 03/15/2019) 30 tablet 2   No current facility-administered medications for this encounter.     REVIEW OF SYSTEMS:  On review of systems, the patient reports that he is doing well overall. He denies any chest pain, shortness of breath, cough, fevers, chills, night sweats, unintended weight changes. He denies any bowel disturbances, and denies abdominal pain, nausea or vomiting. He denies any new musculoskeletal or joint aches or pains. His IPSS was 16, indicating moderate urinary symptoms. His SHIM was 24 with aid of medication, indicating he has a history of erectile dysfunction. A complete review of systems is obtained and is otherwise negative.    PHYSICAL EXAM:  Wt Readings from Last 3 Encounters:  03/15/19 170 lb (77.1 kg)  02/18/19 166 lb (75.3 kg)  11/16/18 165 lb (74.8 kg)   Temp Readings from Last 3 Encounters:  03/15/19 (!) 97.2 F (36.2 C) (Oral)  11/16/18 (!) 97.4 F (36.3 C)  08/13/18 97.6 F (36.4 C) (Oral)   BP Readings from Last 3  Encounters:  11/16/18 104/62  08/13/18 (!) 98/56  05/18/18 (!) 111/46   Pulse Readings from Last 3 Encounters:  11/16/18 62  08/13/18 70  05/18/18 95   Pain Assessment Pain Score: 0-No pain/10  Unable to assess due to telephone consult visit format.   KPS = 90  100 - Normal; no complaints; no evidence of disease. 90   - Able to carry on normal activity; minor signs or symptoms of disease. 80   - Normal activity with effort; some signs or symptoms of disease. 63   - Cares for self; unable to carry on normal activity or to do active work. 60   - Requires occasional assistance, but is able to care for most of his personal needs. 50   - Requires considerable assistance and frequent medical care. 71   - Disabled; requires special care and assistance. 51   - Severely disabled; hospital admission is indicated although death not imminent. 52   - Very sick; hospital admission necessary; active supportive treatment necessary. 10   - Moribund; fatal processes progressing rapidly. 0     - Dead  Karnofsky DA, Abelmann [place], Craver LS and Burchenal [facility] [phone]) The use of the nitrogen mustards in the palliative treatment of carcinoma: with particular reference to bronchogenic carcinoma Cancer 1 634-56  LABORATORY DATA:  Lab Results  Component Value Date   WBC 7.1 02/11/2019   HGB 13.3 02/11/2019   HCT 39.4 02/11/2019   MCV 93.1 02/11/2019   PLT 231.0 02/11/2019   Lab Results  Component Value Date   NA 139 02/11/2019   K 4.9 02/11/2019   CL 104 02/11/2019   CO2 26 02/11/2019   Lab Results  Component Value Date  ALT 17 02/11/2019   AST 16 02/11/2019   ALKPHOS 49 02/11/2019   BILITOT 0.5 02/11/2019     RADIOGRAPHY: No results found.    IMPRESSION/PLAN: 1. 84 y.o. gentleman with Stage T1c adenocarcinoma of the prostate with Gleason Score of 4+3, and PSA of 8.72.  We discussed the patient's workup and outlined the nature of prostate cancer in this setting. The patient's T stage,  Gleason's score, and PSA put him into the unfavorable intermediate risk group. Accordingly, he is eligible for a variety of potential treatment options including 5.5 weeks of external radiation or brachytherapy. We discussed that he is not an ideal candidate for brachytherapy considering his baseline moderate urinary symptoms and large prostate volume of at least 60 gm. We discussed the available radiation techniques, and focused on the details and logistics and delivery. We discussed and outlined the risks, benefits, short and long-term effects associated with radiotherapy. We discussed the role of SpaceOAR in reducing the rectal toxicity associated with radiotherapy. We also detailed the role of ADT in the treatment of higher risk prostate cancers, but that we did not feel this would be necessary in his case as the negative imapact on his quality of life would likely outweigh any potential benefit. He also asked questions regarding proton therapy. We discussed the difference between proton and photon radiation therapies and encouraged him to continue exploring this option with MD Ouida Sills if he remained interested.  He was encouraged to ask questions that were answered to his stated satisfaction.  At the end of the conversation the patient remains undecided regarding his treatment preference and prefers to continue gathering information regarding proton therapy before making a final decision. He will contact us if he chooses to undergo external radiation therapy here so that we can make arrangements for fiducial marker and space or gel placement prior to CT simulation in preparation for a 5-1/2-week course of prostate IMRT. We wished him the best with MD Ouida Sills and look forward to following along in the care of this very nice gentleman.  Of course, we would be more than happy to continue to participate in his care should he elect to proceed with radiation treatments locally.  He knows that he is welcome to  call at anytime with any questions or concerns in the interim.   Given current concerns for patient exposure during the COVID-19 pandemic, this encounter was conducted via telephone. The patient was notified in advance and was offered a MyChart meeting to allow for face to face communication but unfortunately reported that he did not have the appropriate resources/technology to support such a visit and instead preferred to proceed with telephone consult. The patient has given verbal consent for this type of encounter. The time spent during this encounter was 50 minutes. The attendants for this meeting include Tyler Pita MD, Ashlyn Bruning PA-C, [place], and patient, Trevis Nylen. During the encounter, Tyler Pita MD, Ashlyn Bruning PA-C, and scribe, Wilburn Mylar were located at [place].  Patient, Earnie Stall was located at home.    Nicholos Johns, PA-C    Tyler Pita, MD  [facility] Direct Dial: [phone]   Fax: [phone] [email or web address]   Skype   LinkedIn  This document serves as a record of services personally performed by Tyler Pita, MD and Freeman Caldron, PA-C. It was created on their behalf by Wilburn Mylar, a trained medical scribe. The creation of this record is based  on the scribe's personal observations and the provider's statements to them. This document has been checked and approved by the attending provider.

## 2019-03-15 NOTE — Progress Notes (Signed)
GU Location of Tumor / Histology: Prostatic adenocarcinoma  If Prostate Cancer, Gleason Score is (4 + 3) and PSA is (8.72). Prostate volume: 47.50 mL.  Biopsies of prostate (if applicable) revealed:   Past/Anticipated interventions by urology, if any: MRI prostate, urolift, prostate biopsy, repeat prostate biopsy, referral for consideration of radiotherapy  Past/Anticipated interventions by medical oncology, if any: no  Weight changes, if any: no  Bowel/Bladder complaints, if any: IPSS 16. SHIM 24 with aid of medication. Denies dysuria or hematuria. Denies urinary leakage or incontinence.    Nausea/Vomiting, if any: no  Pain issues, if any:  no  SAFETY ISSUES:  Prior radiation? no  Pacemaker/ICD? no  Possible current pregnancy? no, male patient  Is the patient on methotrexate? no  Current Complaints / other details:  84 year old male.

## 2019-03-25 ENCOUNTER — Telehealth: Payer: Self-pay | Admitting: Medical Oncology

## 2019-03-25 ENCOUNTER — Encounter: Payer: Self-pay | Admitting: Urology

## 2019-03-25 NOTE — Progress Notes (Signed)
Pateint elects to move forward with a 5.5 week course of prostate IMRT.  I have sent an inbox request to Romie Jumper, here in our office, to move forward with coordinating for fiducial marker and spaceOAR gel placement with Dr. Diona Fanti, first available, prior to CT simulation in preparation for a 5-1/2-week course of prostate IMRT.  Nicholos Johns, MMS, PA-C [place] at [place]: [phone]  Fax: [phone]

## 2019-03-28 ENCOUNTER — Telehealth: Payer: Self-pay | Admitting: Radiation Oncology

## 2019-03-28 ENCOUNTER — Telehealth: Payer: Self-pay | Admitting: *Deleted

## 2019-03-28 NOTE — Telephone Encounter (Signed)
CALLED PATIENT TO INFORM THAT I HAVE CALLED [facility] TO SET UP HIS FID. MARKERS AND SPACE OAR, INFORMED PATIENT THAT I WENT INTO A VM, AND I ASKED THAT HE GIVES THEM A COUPLE OF DAYS TO MAKE APPTS. BEFORE CALLING BACK, PATIENT VERIFIED UNDERSTANDING THIS

## 2019-03-28 NOTE — Telephone Encounter (Signed)
Informed by Shona Simpson, PA-C that the patient left two voice mails on her answering machine over the weekend. Phoned patient to inquire. Patient reports calling late Thursday afternoon and early Friday morning to inquire about appointments for fiducial marker placement and spaceOAR gel. Patient goes onto explain that late on Friday Robin Bass, prostate navigator, phoned to introduce herself and she explained that Enid Derry will be in touch with these appointments. Patient denies additional needs at this time and expressed appreciation for the call.

## 2019-03-29 NOTE — Progress Notes (Signed)
Spoke with patient to introduce myself as the prostate nurse navigator and discuss my role. I was unable to meet him 03/15/19 when he consulted with Dr. Tammi Klippel. He states the consult went well and was very pleased. He states he was interested in proton therapy and wanted to do some research before making his final decision. He contacted MD Ouida Sills and after discussing proton therapy, he has decided to move forward with 5 1/2 weeks of radiation. He is very anxious and would like to get started.He needs hip replacement and would like to complete radiation, before scheduling this surgery. I discussed the next steps that will  happen before beginning radiation. I informed him, I will notify Dr. Forrest Moron and Enid Derry of his decision and he will receive a call from [facility] who will schedule appointments.He voiced understanding and was greatly appreciative of my help as a navigator. I gave him my office number and asked him to call me with questions or concerns.

## 2019-03-31 ENCOUNTER — Other Ambulatory Visit: Payer: Self-pay | Admitting: Urology

## 2019-03-31 ENCOUNTER — Telehealth: Payer: Self-pay | Admitting: *Deleted

## 2019-03-31 DIAGNOSIS — C61 Malignant neoplasm of prostate: Secondary | ICD-10-CM

## 2019-03-31 NOTE — Telephone Encounter (Signed)
CALLED PATIENT TO INFORM OF FID. MARKER AND SPACE OAR PLACEMENT  ON 04-14-19 @ 8 AM, AND HIS SIM ON 04-19-19- ARRIVAL TIME - 8:45 AM @ [place], SPOKE WITH PATIENT AND HE IS AWARE OF THESE APPTS.

## 2019-04-13 ENCOUNTER — Telehealth: Payer: Self-pay | Admitting: *Deleted

## 2019-04-13 NOTE — Telephone Encounter (Signed)
CALLED PATIENT TO INFORM OF SIM APPT. FOR 04-19-19 - ARRIVAL TIME - 8:45 AM @ [place] - ARRIVAL TIME- 11:45 AM @ [facility], SPOKE WITH PATIENT'S WIFE MARY AND SHE IS AWARE OF THESE APPTS.

## 2019-04-18 ENCOUNTER — Telehealth: Payer: Self-pay | Admitting: *Deleted

## 2019-04-18 NOTE — Telephone Encounter (Signed)
Called patient to remind of sim and MRI appt. for 04-19-19, lvm for a return call

## 2019-04-19 ENCOUNTER — Ambulatory Visit
Admission: RE | Admit: 2019-04-19 | Discharge: 2019-04-19 | Disposition: A | Discharge status: Home / Self Care | Payer: Medicare Other | Source: Ambulatory Visit | Attending: Radiation Oncology | Admitting: Radiation Oncology

## 2019-04-19 ENCOUNTER — Other Ambulatory Visit: Payer: Self-pay

## 2019-04-19 ENCOUNTER — Encounter: Payer: Self-pay | Admitting: Medical Oncology

## 2019-04-19 ENCOUNTER — Ambulatory Visit (HOSPITAL_COMMUNITY)
Admission: RE | Admit: 2019-04-19 | Discharge: 2019-04-19 | Disposition: A | Discharge status: Home / Self Care | Payer: Medicare Other | Source: Ambulatory Visit | Attending: Urology | Admitting: Urology

## 2019-04-19 DIAGNOSIS — C61 Malignant neoplasm of prostate: Secondary | ICD-10-CM | POA: Insufficient documentation

## 2019-04-19 DIAGNOSIS — Z510 Encounter for antineoplastic radiation therapy: Secondary | ICD-10-CM | POA: Insufficient documentation

## 2019-04-19 NOTE — Progress Notes (Signed)
  Radiation Oncology         [phone] ________________________________  Name: [name] MRN: [id number]  Date: 04/19/2019  DOB: 29-Mar-1935  SIMULATION AND TREATMENT PLANNING NOTE    ICD-10-CM   1. Malignant neoplasm of prostate ([place])  C61     DIAGNOSIS:  84 y.o. gentleman with Stage T1c adenocarcinoma of the prostate with Gleason score of 4+3, and PSA of 8.72.  NARRATIVE:  The patient was brought to the [place].  Identity was confirmed.  All relevant records and images related to the planned course of therapy were reviewed.  The patient freely provided informed written consent to proceed with treatment after reviewing the details related to the planned course of therapy. The consent form was witnessed and verified by the simulation staff.  Then, the patient was set-up in a stable reproducible supine position for radiation therapy.  A vacuum lock pillow device was custom fabricated to position his legs in a reproducible immobilized position.  Then, I performed a urethrogram under sterile conditions to identify the prostatic apex.  CT images were obtained.  Surface markings were placed.  The CT images were loaded into the planning software.  Then the prostate target and avoidance structures including the rectum, bladder, bowel and hips were contoured.  Treatment planning then occurred.  The radiation prescription was entered and confirmed.  A total of one complex treatment devices was fabricated. I have requested : Intensity Modulated Radiotherapy (IMRT) is medically necessary for this case for the following reason:  Rectal sparing.Marland Kitchen  PLAN:  The patient will receive 70 Gy in 28 fractions.  ________________________________  Sheral Apley Tammi Klippel, M.D.

## 2019-04-23 ENCOUNTER — Other Ambulatory Visit: Payer: Self-pay | Admitting: Family Medicine

## 2019-04-25 DIAGNOSIS — C61 Malignant neoplasm of prostate: Secondary | ICD-10-CM | POA: Diagnosis not present

## 2019-04-28 ENCOUNTER — Ambulatory Visit: Payer: Medicare Other

## 2019-04-29 ENCOUNTER — Ambulatory Visit: Payer: Medicare Other

## 2019-05-02 ENCOUNTER — Encounter: Payer: Self-pay | Admitting: Medical Oncology

## 2019-05-02 ENCOUNTER — Ambulatory Visit
Admission: RE | Admit: 2019-05-02 | Discharge: 2019-05-02 | Disposition: A | Discharge status: Home / Self Care | Payer: Medicare Other | Source: Ambulatory Visit | Attending: Radiation Oncology | Admitting: Radiation Oncology

## 2019-05-02 ENCOUNTER — Other Ambulatory Visit: Payer: Self-pay

## 2019-05-02 DIAGNOSIS — C61 Malignant neoplasm of prostate: Secondary | ICD-10-CM | POA: Diagnosis not present

## 2019-05-03 ENCOUNTER — Encounter: Payer: Self-pay | Admitting: Medical Oncology

## 2019-05-03 ENCOUNTER — Telehealth: Payer: Self-pay | Admitting: Medical Oncology

## 2019-05-03 ENCOUNTER — Other Ambulatory Visit: Payer: Self-pay | Admitting: Urology

## 2019-05-03 ENCOUNTER — Ambulatory Visit
Admission: RE | Admit: 2019-05-03 | Discharge: 2019-05-03 | Disposition: A | Discharge status: Home / Self Care | Payer: Medicare Other | Source: Ambulatory Visit | Attending: Radiation Oncology | Admitting: Radiation Oncology

## 2019-05-03 ENCOUNTER — Other Ambulatory Visit: Payer: Self-pay

## 2019-05-03 DIAGNOSIS — C61 Malignant neoplasm of prostate: Secondary | ICD-10-CM | POA: Diagnosis not present

## 2019-05-03 DIAGNOSIS — N401 Enlarged prostate with lower urinary tract symptoms: Secondary | ICD-10-CM

## 2019-05-03 DIAGNOSIS — N138 Other obstructive and reflux uropathy: Secondary | ICD-10-CM

## 2019-05-03 MED ORDER — SILODOSIN 8 MG PO CAPS: 8.0000 mg | ORAL_CAPSULE | Freq: Every day | ORAL | 5 refills | Status: DC

## 2019-05-03 NOTE — Progress Notes (Signed)
I saw [name] during treatment and he states,he is having trouble with nocturia and having to strain to urinate. Sometimes, he just has a few drops of urine. This  started a few weeks ago and now starting radiation, he is concerned it will get worse. He has an enlarged prostate and history of a Urolift  In 2018. His symptoms improved after the lift but have gotten worse over time. He has taken Flomax in the past without improvement but would consider taking it, if it could possibly help. He will discuss with Dr. Tammi Klippel on Friday during his weekly visit and I will forward to Ashlyn.

## 2019-05-03 NOTE — Telephone Encounter (Signed)
Spoke with patient to let him know I discussed his urinary symptoms with Ashlyn, PA and she has prescribed him Rapalfo. I encouraged him to pick it up today and start taking so he can let Dr. Tammi Klippel know on Friday if it is helping. He voiced understanding and very appreciative of my help. I encouraged to call me with questions or concerns.

## 2019-05-04 ENCOUNTER — Other Ambulatory Visit: Payer: Self-pay

## 2019-05-04 ENCOUNTER — Ambulatory Visit
Admission: RE | Admit: 2019-05-04 | Discharge: 2019-05-04 | Disposition: A | Discharge status: Home / Self Care | Payer: Medicare Other | Source: Ambulatory Visit | Attending: Radiation Oncology | Admitting: Radiation Oncology

## 2019-05-04 DIAGNOSIS — C61 Malignant neoplasm of prostate: Secondary | ICD-10-CM | POA: Diagnosis not present

## 2019-05-05 ENCOUNTER — Other Ambulatory Visit: Payer: Self-pay

## 2019-05-05 ENCOUNTER — Ambulatory Visit
Admission: RE | Admit: 2019-05-05 | Discharge: 2019-05-05 | Disposition: A | Discharge status: Home / Self Care | Payer: Medicare Other | Source: Ambulatory Visit | Attending: Radiation Oncology | Admitting: Radiation Oncology

## 2019-05-05 DIAGNOSIS — C61 Malignant neoplasm of prostate: Secondary | ICD-10-CM | POA: Diagnosis not present

## 2019-05-06 ENCOUNTER — Ambulatory Visit
Admission: RE | Admit: 2019-05-06 | Discharge: 2019-05-06 | Disposition: A | Discharge status: Home / Self Care | Payer: Medicare Other | Source: Ambulatory Visit | Attending: Radiation Oncology | Admitting: Radiation Oncology

## 2019-05-06 ENCOUNTER — Other Ambulatory Visit: Payer: Self-pay

## 2019-05-06 DIAGNOSIS — C61 Malignant neoplasm of prostate: Secondary | ICD-10-CM | POA: Diagnosis not present

## 2019-05-09 ENCOUNTER — Other Ambulatory Visit: Payer: Self-pay

## 2019-05-09 ENCOUNTER — Ambulatory Visit
Admission: RE | Admit: 2019-05-09 | Discharge: 2019-05-09 | Disposition: A | Discharge status: Home / Self Care | Payer: Medicare Other | Source: Ambulatory Visit | Attending: Radiation Oncology | Admitting: Radiation Oncology

## 2019-05-09 DIAGNOSIS — C61 Malignant neoplasm of prostate: Secondary | ICD-10-CM | POA: Insufficient documentation

## 2019-05-09 DIAGNOSIS — Z510 Encounter for antineoplastic radiation therapy: Secondary | ICD-10-CM | POA: Diagnosis not present

## 2019-05-10 ENCOUNTER — Ambulatory Visit
Admission: RE | Admit: 2019-05-10 | Discharge: 2019-05-10 | Disposition: A | Discharge status: Home / Self Care | Payer: Medicare Other | Source: Ambulatory Visit | Attending: Radiation Oncology | Admitting: Radiation Oncology

## 2019-05-10 ENCOUNTER — Other Ambulatory Visit: Payer: Self-pay

## 2019-05-10 DIAGNOSIS — C61 Malignant neoplasm of prostate: Secondary | ICD-10-CM | POA: Diagnosis not present

## 2019-05-11 ENCOUNTER — Other Ambulatory Visit: Payer: Self-pay

## 2019-05-11 ENCOUNTER — Ambulatory Visit
Admission: RE | Admit: 2019-05-11 | Discharge: 2019-05-11 | Disposition: A | Discharge status: Home / Self Care | Payer: Medicare Other | Source: Ambulatory Visit | Attending: Radiation Oncology | Admitting: Radiation Oncology

## 2019-05-11 DIAGNOSIS — C61 Malignant neoplasm of prostate: Secondary | ICD-10-CM | POA: Diagnosis not present

## 2019-05-12 ENCOUNTER — Other Ambulatory Visit: Payer: Self-pay

## 2019-05-12 ENCOUNTER — Ambulatory Visit
Admission: RE | Admit: 2019-05-12 | Discharge: 2019-05-12 | Disposition: A | Discharge status: Home / Self Care | Payer: Medicare Other | Source: Ambulatory Visit | Attending: Radiation Oncology | Admitting: Radiation Oncology

## 2019-05-12 DIAGNOSIS — C61 Malignant neoplasm of prostate: Secondary | ICD-10-CM | POA: Diagnosis not present

## 2019-05-13 ENCOUNTER — Other Ambulatory Visit: Payer: Self-pay

## 2019-05-13 ENCOUNTER — Ambulatory Visit
Admission: RE | Admit: 2019-05-13 | Discharge: 2019-05-13 | Disposition: A | Discharge status: Home / Self Care | Payer: Medicare Other | Source: Ambulatory Visit | Attending: Radiation Oncology | Admitting: Radiation Oncology

## 2019-05-13 DIAGNOSIS — C61 Malignant neoplasm of prostate: Secondary | ICD-10-CM | POA: Diagnosis not present

## 2019-05-16 ENCOUNTER — Ambulatory Visit
Admission: RE | Admit: 2019-05-16 | Discharge: 2019-05-16 | Disposition: A | Discharge status: Home / Self Care | Payer: Medicare Other | Source: Ambulatory Visit | Attending: Radiation Oncology | Admitting: Radiation Oncology

## 2019-05-16 ENCOUNTER — Other Ambulatory Visit: Payer: Self-pay

## 2019-05-16 DIAGNOSIS — C61 Malignant neoplasm of prostate: Secondary | ICD-10-CM | POA: Diagnosis not present

## 2019-05-17 ENCOUNTER — Other Ambulatory Visit: Payer: Self-pay

## 2019-05-17 ENCOUNTER — Ambulatory Visit
Admission: RE | Admit: 2019-05-17 | Discharge: 2019-05-17 | Disposition: A | Discharge status: Home / Self Care | Payer: Medicare Other | Source: Ambulatory Visit | Attending: Radiation Oncology | Admitting: Radiation Oncology

## 2019-05-17 DIAGNOSIS — C61 Malignant neoplasm of prostate: Secondary | ICD-10-CM | POA: Diagnosis not present

## 2019-05-18 ENCOUNTER — Ambulatory Visit
Admission: RE | Admit: 2019-05-18 | Discharge: 2019-05-18 | Disposition: A | Discharge status: Home / Self Care | Payer: Medicare Other | Source: Ambulatory Visit | Attending: Radiation Oncology | Admitting: Radiation Oncology

## 2019-05-18 ENCOUNTER — Other Ambulatory Visit: Payer: Self-pay

## 2019-05-18 DIAGNOSIS — C61 Malignant neoplasm of prostate: Secondary | ICD-10-CM | POA: Diagnosis not present

## 2019-05-19 ENCOUNTER — Other Ambulatory Visit: Payer: Self-pay

## 2019-05-19 ENCOUNTER — Ambulatory Visit
Admission: RE | Admit: 2019-05-19 | Discharge: 2019-05-19 | Disposition: A | Discharge status: Home / Self Care | Payer: Medicare Other | Source: Ambulatory Visit | Attending: Radiation Oncology | Admitting: Radiation Oncology

## 2019-05-19 DIAGNOSIS — C61 Malignant neoplasm of prostate: Secondary | ICD-10-CM | POA: Diagnosis not present

## 2019-05-20 ENCOUNTER — Ambulatory Visit
Admission: RE | Admit: 2019-05-20 | Discharge: 2019-05-20 | Disposition: A | Discharge status: Home / Self Care | Payer: Medicare Other | Source: Ambulatory Visit | Attending: Radiation Oncology | Admitting: Radiation Oncology

## 2019-05-20 ENCOUNTER — Other Ambulatory Visit: Payer: Self-pay | Admitting: Radiation Oncology

## 2019-05-20 ENCOUNTER — Other Ambulatory Visit: Payer: Self-pay

## 2019-05-20 DIAGNOSIS — C61 Malignant neoplasm of prostate: Secondary | ICD-10-CM | POA: Diagnosis not present

## 2019-05-23 ENCOUNTER — Ambulatory Visit
Admission: RE | Admit: 2019-05-23 | Discharge: 2019-05-23 | Disposition: A | Discharge status: Home / Self Care | Payer: Medicare Other | Source: Ambulatory Visit | Attending: Radiation Oncology | Admitting: Radiation Oncology

## 2019-05-23 ENCOUNTER — Other Ambulatory Visit: Payer: Self-pay

## 2019-05-23 DIAGNOSIS — C61 Malignant neoplasm of prostate: Secondary | ICD-10-CM | POA: Diagnosis not present

## 2019-05-24 ENCOUNTER — Other Ambulatory Visit: Payer: Self-pay

## 2019-05-24 ENCOUNTER — Ambulatory Visit
Admission: RE | Admit: 2019-05-24 | Discharge: 2019-05-24 | Disposition: A | Discharge status: Home / Self Care | Payer: Medicare Other | Source: Ambulatory Visit | Attending: Radiation Oncology | Admitting: Radiation Oncology

## 2019-05-24 DIAGNOSIS — C61 Malignant neoplasm of prostate: Secondary | ICD-10-CM | POA: Diagnosis not present

## 2019-05-25 ENCOUNTER — Ambulatory Visit
Admission: RE | Admit: 2019-05-25 | Discharge: 2019-05-25 | Disposition: A | Discharge status: Home / Self Care | Payer: Medicare Other | Source: Ambulatory Visit | Attending: Radiation Oncology | Admitting: Radiation Oncology

## 2019-05-25 ENCOUNTER — Other Ambulatory Visit: Payer: Self-pay

## 2019-05-25 DIAGNOSIS — C61 Malignant neoplasm of prostate: Secondary | ICD-10-CM | POA: Diagnosis not present

## 2019-05-26 ENCOUNTER — Ambulatory Visit
Admission: RE | Admit: 2019-05-26 | Discharge: 2019-05-26 | Disposition: A | Discharge status: Home / Self Care | Payer: Medicare Other | Source: Ambulatory Visit | Attending: Radiation Oncology | Admitting: Radiation Oncology

## 2019-05-26 ENCOUNTER — Other Ambulatory Visit: Payer: Self-pay

## 2019-05-26 DIAGNOSIS — C61 Malignant neoplasm of prostate: Secondary | ICD-10-CM | POA: Diagnosis not present

## 2019-05-27 ENCOUNTER — Other Ambulatory Visit: Payer: Self-pay

## 2019-05-27 ENCOUNTER — Ambulatory Visit
Admission: RE | Admit: 2019-05-27 | Discharge: 2019-05-27 | Disposition: A | Discharge status: Home / Self Care | Payer: Medicare Other | Source: Ambulatory Visit | Attending: Radiation Oncology | Admitting: Radiation Oncology

## 2019-05-27 DIAGNOSIS — C61 Malignant neoplasm of prostate: Secondary | ICD-10-CM | POA: Diagnosis not present

## 2019-05-29 ENCOUNTER — Ambulatory Visit
Admission: RE | Admit: 2019-05-29 | Discharge: 2019-05-29 | Disposition: A | Discharge status: Home / Self Care | Payer: Medicare Other | Source: Ambulatory Visit | Attending: Radiation Oncology | Admitting: Radiation Oncology

## 2019-05-29 ENCOUNTER — Other Ambulatory Visit: Payer: Self-pay

## 2019-05-29 DIAGNOSIS — C61 Malignant neoplasm of prostate: Secondary | ICD-10-CM | POA: Diagnosis not present

## 2019-05-30 ENCOUNTER — Telehealth: Payer: Self-pay | Admitting: Family Medicine

## 2019-05-30 ENCOUNTER — Ambulatory Visit
Admission: RE | Admit: 2019-05-30 | Discharge: 2019-05-30 | Disposition: A | Discharge status: Home / Self Care | Payer: Medicare Other | Source: Ambulatory Visit | Attending: Radiation Oncology | Admitting: Radiation Oncology

## 2019-05-30 ENCOUNTER — Other Ambulatory Visit: Payer: Self-pay

## 2019-05-30 DIAGNOSIS — E119 Type 2 diabetes mellitus without complications: Secondary | ICD-10-CM

## 2019-05-30 DIAGNOSIS — E785 Hyperlipidemia, unspecified: Secondary | ICD-10-CM

## 2019-05-30 DIAGNOSIS — C61 Malignant neoplasm of prostate: Secondary | ICD-10-CM | POA: Diagnosis not present

## 2019-05-30 DIAGNOSIS — E782 Mixed hyperlipidemia: Secondary | ICD-10-CM

## 2019-05-30 NOTE — Telephone Encounter (Signed)
Patient requesting glucose and A1C orders to check blood sugar levels and would like labs done on Friday 06/17/2019.   Blood Sugar levels: October 30 day average mean was 139 / November blood sugar level the last 2 days were 165 and 182

## 2019-05-31 ENCOUNTER — Ambulatory Visit
Admission: RE | Admit: 2019-05-31 | Discharge: 2019-05-31 | Disposition: A | Discharge status: Home / Self Care | Payer: Medicare Other | Source: Ambulatory Visit | Attending: Radiation Oncology | Admitting: Radiation Oncology

## 2019-05-31 ENCOUNTER — Other Ambulatory Visit: Payer: Self-pay

## 2019-05-31 DIAGNOSIS — C61 Malignant neoplasm of prostate: Secondary | ICD-10-CM | POA: Diagnosis not present

## 2019-05-31 NOTE — Telephone Encounter (Signed)
Patient has future labs for January 2021 before his visit with pcp

## 2019-06-01 ENCOUNTER — Other Ambulatory Visit: Payer: Self-pay

## 2019-06-01 ENCOUNTER — Ambulatory Visit
Admission: RE | Admit: 2019-06-01 | Discharge: 2019-06-01 | Disposition: A | Discharge status: Home / Self Care | Payer: Medicare Other | Source: Ambulatory Visit | Attending: Radiation Oncology | Admitting: Radiation Oncology

## 2019-06-01 DIAGNOSIS — C61 Malignant neoplasm of prostate: Secondary | ICD-10-CM | POA: Diagnosis not present

## 2019-06-06 ENCOUNTER — Other Ambulatory Visit: Payer: Self-pay | Admitting: Family Medicine

## 2019-06-06 ENCOUNTER — Ambulatory Visit
Admission: RE | Admit: 2019-06-06 | Discharge: 2019-06-06 | Disposition: A | Discharge status: Home / Self Care | Payer: Medicare Other | Source: Ambulatory Visit | Attending: Radiation Oncology | Admitting: Radiation Oncology

## 2019-06-06 ENCOUNTER — Other Ambulatory Visit: Payer: Self-pay

## 2019-06-06 DIAGNOSIS — C61 Malignant neoplasm of prostate: Secondary | ICD-10-CM | POA: Diagnosis not present

## 2019-06-07 ENCOUNTER — Ambulatory Visit
Admission: RE | Admit: 2019-06-07 | Discharge: 2019-06-07 | Disposition: A | Discharge status: Home / Self Care | Payer: Medicare Other | Source: Ambulatory Visit | Attending: Radiation Oncology | Admitting: Radiation Oncology

## 2019-06-07 ENCOUNTER — Other Ambulatory Visit: Payer: Self-pay

## 2019-06-07 DIAGNOSIS — Z510 Encounter for antineoplastic radiation therapy: Secondary | ICD-10-CM | POA: Diagnosis not present

## 2019-06-07 DIAGNOSIS — C61 Malignant neoplasm of prostate: Secondary | ICD-10-CM | POA: Diagnosis present

## 2019-06-08 ENCOUNTER — Other Ambulatory Visit: Payer: Self-pay

## 2019-06-08 ENCOUNTER — Ambulatory Visit: Payer: Medicare Other

## 2019-06-08 ENCOUNTER — Ambulatory Visit
Admission: RE | Admit: 2019-06-08 | Discharge: 2019-06-08 | Disposition: A | Discharge status: Home / Self Care | Payer: Medicare Other | Source: Ambulatory Visit | Attending: Radiation Oncology | Admitting: Radiation Oncology

## 2019-06-08 DIAGNOSIS — C61 Malignant neoplasm of prostate: Secondary | ICD-10-CM | POA: Diagnosis not present

## 2019-06-09 ENCOUNTER — Encounter: Payer: Self-pay | Admitting: Radiation Oncology

## 2019-06-09 ENCOUNTER — Ambulatory Visit
Admission: RE | Admit: 2019-06-09 | Discharge: 2019-06-09 | Disposition: A | Discharge status: Home / Self Care | Payer: Medicare Other | Source: Ambulatory Visit | Attending: Radiation Oncology | Admitting: Radiation Oncology

## 2019-06-09 ENCOUNTER — Ambulatory Visit: Payer: Medicare Other

## 2019-06-09 ENCOUNTER — Other Ambulatory Visit: Payer: Self-pay

## 2019-06-09 DIAGNOSIS — C61 Malignant neoplasm of prostate: Secondary | ICD-10-CM | POA: Diagnosis not present

## 2019-06-10 ENCOUNTER — Ambulatory Visit: Payer: Medicare Other

## 2019-06-13 NOTE — Telephone Encounter (Signed)
He is due for lab work as it has been 90 days so set him up for cmp, cbc, tsh, lipid and hgba1c and lab appt then setup a virtual visit to discuss results and options after that.

## 2019-06-13 NOTE — Telephone Encounter (Signed)
Left message that we will forward to provider and call back with what to do.

## 2019-06-13 NOTE — Telephone Encounter (Signed)
Pt requesting a call back from provider or assistant.  His blood sugars have been 150-180 fasting for the last 3 months.  He sees this as a red flag and does not want to wait until his next appt.  He is requesting a call back  cb is [phone]

## 2019-06-14 ENCOUNTER — Telehealth: Payer: Self-pay | Admitting: Medical Oncology

## 2019-06-14 NOTE — Telephone Encounter (Signed)
Spoke with patient to inform him I spoke with Ashlyn about his urinary symptoms. She would like for him to increase the Rapaflo to twice a day. If his symptoms do not improve by Thursday evening, call Dr. Diona Fanti Friday morning to be seen. He voiced understanding and he was very appreciative of my return call.

## 2019-06-14 NOTE — Telephone Encounter (Signed)
Patient called stating he completed radiation to the prostate 12/3 and is now having major urinary symptoms. He reports a stream for 3 seconds and then he dribbles and drips. He may go 7-8 times and hour and he was up last night every hour. He had a weak stream stream prior to radiation and started Rapaflo when he started radiation.  He is miserable and partly because he is not getting sleep. I discussed how the symptoms should begin to improve over the next few weeks but I will be happy to discuss with Ashlyn, PA and call him back. He voiced understanding.

## 2019-06-14 NOTE — Telephone Encounter (Signed)
Can you set patient up with a lab appt then a vv with pcp Please advise

## 2019-06-15 NOTE — Telephone Encounter (Signed)
I spoke to pt. He said that he has been waiting on this phone call for 3 weeks. Pt scheduled lab appt 12/16 as he said he wanted morning 8:30-9:00am. I offered VV with Dr. Charlett Blake 12/22. Pt said that he needed something sooner as his fasting blood sugars have been 180-190. Pt was very upset in delay and said that the PA should be able to take care of this. Pt said he has adjusted his Glyburide on his own. He has doubled the dose. He is taking 2.5mg  in the morning and 2.5mg  in the evening.   Pt requesting call from someone clinical today if possible.

## 2019-06-16 ENCOUNTER — Other Ambulatory Visit (INDEPENDENT_AMBULATORY_CARE_PROVIDER_SITE_OTHER): Payer: Medicare Other

## 2019-06-16 ENCOUNTER — Other Ambulatory Visit: Payer: Self-pay | Admitting: Family Medicine

## 2019-06-16 ENCOUNTER — Other Ambulatory Visit: Payer: Self-pay

## 2019-06-16 DIAGNOSIS — E119 Type 2 diabetes mellitus without complications: Secondary | ICD-10-CM

## 2019-06-16 DIAGNOSIS — E785 Hyperlipidemia, unspecified: Secondary | ICD-10-CM | POA: Diagnosis not present

## 2019-06-16 DIAGNOSIS — E782 Mixed hyperlipidemia: Secondary | ICD-10-CM

## 2019-06-16 LAB — CBC
HCT: 39.1 % (ref 39.0–52.0)
Hemoglobin: 13 g/dL (ref 13.0–17.0)
MCHC: 33.3 g/dL (ref 30.0–36.0)
MCV: 93.6 fl (ref 78.0–100.0)
Platelets: 253 10*3/uL (ref 150.0–400.0)
RBC: 4.18 Mil/uL — ABNORMAL LOW (ref 4.22–5.81)
RDW: 13.9 % (ref 11.5–15.5)
WBC: 7 10*3/uL (ref 4.0–10.5)

## 2019-06-16 LAB — COMPREHENSIVE METABOLIC PANEL
ALT: 16 U/L (ref 0–53)
AST: 14 U/L (ref 0–37)
Albumin: 4.2 g/dL (ref 3.5–5.2)
Alkaline Phosphatase: 54 U/L (ref 39–117)
BUN: 22 mg/dL (ref 6–23)
CO2: 26 mEq/L (ref 19–32)
Calcium: 9.4 mg/dL (ref 8.4–10.5)
Chloride: 103 mEq/L (ref 96–112)
Creatinine, Ser: 1.26 mg/dL (ref 0.40–1.50)
GFR: 54.74 mL/min — ABNORMAL LOW (ref >60.00)
Glucose, Bld: 201 mg/dL — ABNORMAL HIGH (ref 70–99)
Potassium: 4.4 mEq/L (ref 3.5–5.1)
Sodium: 138 mEq/L (ref 135–145)
Total Bilirubin: 0.6 mg/dL (ref 0.2–1.2)
Total Protein: 6.4 g/dL (ref 6.0–8.3)

## 2019-06-16 LAB — LIPID PANEL
Cholesterol: 172 mg/dL (ref 0–200)
HDL: 57.7 mg/dL (ref >39.00)
LDL Cholesterol: 95 mg/dL (ref 0–99)
NonHDL: 114.36
Total CHOL/HDL Ratio: 3
Triglycerides: 98 mg/dL (ref 0.0–149.0)
VLDL: 19.6 mg/dL (ref 0.0–40.0)

## 2019-06-16 LAB — TSH: TSH: 1.47 u[IU]/mL (ref 0.35–4.50)

## 2019-06-16 LAB — HEMOGLOBIN A1C: Hgb A1c MFr Bld: 6.7 % — ABNORMAL HIGH (ref 4.6–6.5)

## 2019-06-16 MED ORDER — GLYBURIDE 2.5 MG PO TABS: 2.5000 mg | ORAL_TABLET | Freq: Two times a day (BID) | ORAL | 1 refills | Status: DC

## 2019-06-16 NOTE — Telephone Encounter (Signed)
Some where in notes says to get him a lab work done and then we can talk please call and arrange a lab appt for him within the week. Then if he wants and has questions we can do a visit after that.

## 2019-06-16 NOTE — Telephone Encounter (Signed)
Patient added to lab schedule.

## 2019-06-17 ENCOUNTER — Encounter: Payer: Self-pay | Admitting: Family Medicine

## 2019-06-17 ENCOUNTER — Ambulatory Visit (INDEPENDENT_AMBULATORY_CARE_PROVIDER_SITE_OTHER): Payer: Medicare Other | Admitting: Family Medicine

## 2019-06-17 ENCOUNTER — Telehealth: Payer: Self-pay | Admitting: *Deleted

## 2019-06-17 ENCOUNTER — Other Ambulatory Visit (INDEPENDENT_AMBULATORY_CARE_PROVIDER_SITE_OTHER): Payer: Medicare Other

## 2019-06-17 VITALS — BP 169/100 | HR 89 | Ht 72.0 in

## 2019-06-17 DIAGNOSIS — R350 Frequency of micturition: Secondary | ICD-10-CM

## 2019-06-17 DIAGNOSIS — E782 Mixed hyperlipidemia: Secondary | ICD-10-CM | POA: Diagnosis not present

## 2019-06-17 DIAGNOSIS — E119 Type 2 diabetes mellitus without complications: Secondary | ICD-10-CM

## 2019-06-17 DIAGNOSIS — M549 Dorsalgia, unspecified: Secondary | ICD-10-CM | POA: Diagnosis not present

## 2019-06-17 LAB — URINALYSIS, ROUTINE W REFLEX MICROSCOPIC
Bilirubin Urine: NEGATIVE
Ketones, ur: NEGATIVE
Nitrite: NEGATIVE
Specific Gravity, Urine: >=1.03 — AB (ref 1.000–1.030)
Total Protein, Urine: 100 — AB
Urine Glucose: 250 — AB
Urobilinogen, UA: 0.2 (ref 0.0–1.0)
pH: 5.5 (ref 5.0–8.0)

## 2019-06-17 LAB — COMPREHENSIVE METABOLIC PANEL
ALT: 17 U/L (ref 0–53)
AST: 14 U/L (ref 0–37)
Albumin: 4.3 g/dL (ref 3.5–5.2)
Alkaline Phosphatase: 61 U/L (ref 39–117)
BUN: 23 mg/dL (ref 6–23)
CO2: 26 mEq/L (ref 19–32)
Calcium: 9.5 mg/dL (ref 8.4–10.5)
Chloride: 103 mEq/L (ref 96–112)
Creatinine, Ser: 1.35 mg/dL (ref 0.40–1.50)
GFR: 50.55 mL/min — ABNORMAL LOW (ref >60.00)
Glucose, Bld: 276 mg/dL — ABNORMAL HIGH (ref 70–99)
Potassium: 4.6 mEq/L (ref 3.5–5.1)
Sodium: 138 mEq/L (ref 135–145)
Total Bilirubin: 0.5 mg/dL (ref 0.2–1.2)
Total Protein: 6.9 g/dL (ref 6.0–8.3)

## 2019-06-17 LAB — CBC
HCT: 39.9 % (ref 39.0–52.0)
Hemoglobin: 13.4 g/dL (ref 13.0–17.0)
MCHC: 33.6 g/dL (ref 30.0–36.0)
MCV: 93.7 fl (ref 78.0–100.0)
Platelets: 247 10*3/uL (ref 150.0–400.0)
RBC: 4.25 Mil/uL (ref 4.22–5.81)
RDW: 13.9 % (ref 11.5–15.5)
WBC: 7 10*3/uL (ref 4.0–10.5)

## 2019-06-17 LAB — HEMOGLOBIN A1C: Hgb A1c MFr Bld: 6.8 % — ABNORMAL HIGH (ref 4.6–6.5)

## 2019-06-17 LAB — LIPID PANEL
Cholesterol: 170 mg/dL (ref 0–200)
HDL: 58.5 mg/dL (ref >39.00)
LDL Cholesterol: 90 mg/dL (ref 0–99)
NonHDL: 111.96
Total CHOL/HDL Ratio: 3
Triglycerides: 110 mg/dL (ref 0.0–149.0)
VLDL: 22 mg/dL (ref 0.0–40.0)

## 2019-06-17 LAB — TSH: TSH: 1.32 u[IU]/mL (ref 0.35–4.50)

## 2019-06-17 MED ORDER — AMOXICILLIN 500 MG PO CAPS: 500.0000 mg | ORAL_CAPSULE | Freq: Three times a day (TID) | ORAL | 0 refills | Status: DC

## 2019-06-17 NOTE — Telephone Encounter (Signed)
scheduled

## 2019-06-17 NOTE — Telephone Encounter (Signed)
Patient needs 1 month follow up virtual visit

## 2019-06-18 LAB — URINE CULTURE
MICRO NUMBER:: 1189254
Result:: NO GROWTH
SPECIMEN QUALITY:: ADEQUATE

## 2019-06-20 ENCOUNTER — Encounter: Payer: Self-pay | Admitting: *Deleted

## 2019-06-20 DIAGNOSIS — R350 Frequency of micturition: Secondary | ICD-10-CM | POA: Insufficient documentation

## 2019-06-20 NOTE — Assessment & Plan Note (Signed)
Encouraged heart healthy diet, increase exercise, avoid trans fats, consider a krill oil cap daily. Tolerating Simvastatin.  

## 2019-06-20 NOTE — Assessment & Plan Note (Signed)
Check urine culture and started on Amoxicillin. He will attempt to contact his urologist before he decides to start the medication.

## 2019-06-20 NOTE — Progress Notes (Signed)
Virtual Visit via phone Note  I connected with [name] on 06/17/19 at  8:40 AM EST by a phone enabled telemedicine application and verified that I am speaking with the correct person using two identifiers.  Location: Patient: home Provider: home   I discussed the limitations of evaluation and management by telemedicine and the availability of in person appointments. The patient expressed understanding and agreed to proceed. CMA was able to get the patient set up with a phone visit after being unable to set up a video visit   Subjective:    Patient ID: [name], male    DOB: 1935-06-28, 84 y.o.   MRN: [id number]  Chief Complaint  Patient presents with  . Diabetes    Follow up. Reports blood sugar the last 2 days 180-190s. FBS today 196. He increased glyburide to twice a day the last 2 days. BS at 4:15 yesterday was 53 Just finished 28 days radiation for prostate cancer last week.  . Hyperlipidemia    follow up  . Immunizations    Flu vaccine 04/2019, pneum 23 03/20/09, Prevnar 06/04/15  . Prostate Cancer    Pt states he just completed 28 days radiation last week. Urology increased Rapaflo to twice a day recently.  Marland Kitchen elevated blood pressure reading    BP today 169/100. Pt states he had just woken up and readings are usually much lower 138/81, 115/78 and he would not recheck while on the phone with me.    HPI Patient is in today for evaluation of urinary symptoms. He recently underwent a procedure with urology and he is now suffering from urinary frequency, urgency, discomfort. No fevers or chills. No flare in back or abdominal pain. Denies CP/palp/SOB/HA/congestion/fevers/GI or GU c/o. Taking meds as prescribed  Past Medical History:  Diagnosis Date  . Allergy    mild  . Arthritis    fingers, hip  . BPH (benign prostatic hypertrophy)   . Cataract    removed both eyes  . Diabetes mellitus 2000   type 2  . Diabetes mellitus type 2 in nonobese [facility]) 06/19/2009     Qualifier: Diagnosis of  By: Wynona Luna    . GERD (gastroesophageal reflux disease)   . Glaucoma    followed by Dr Lanell Matar at [facility]  . Hyperlipidemia   . Left hip pain 03/14/2012  . Malignant neoplasm of prostate ([place]) 03/15/2019  . Medicare annual wellness visit, subsequent 12/31/2015  . Onychomycosis 09/17/2015  . Prostate cancer ([place])   . Tubular adenoma of colon 2003  . Vasomotor rhinitis 05/02/2013    Past Surgical History:  Procedure Laterality Date  . APPENDECTOMY  1943  . CATARACT EXTRACTION, BILATERAL    . COLONOSCOPY    . CYSTOSCOPY WITH INSERTION OF UROLIFT    . EYE SURGERY     laser surgery on left numerous times.   Marland Kitchen POLYPECTOMY    . TRABECULECTOMY Left 09/04/2016    Family History  Problem Relation Age of Onset  . Obstructive Sleep Apnea Sister   . Heart disease Father        MI  . Diabetes Father   . Arthritis Brother        knees  . Heart disease Brother        a fib  . Breast cancer Sister   . Colon cancer Paternal Uncle 41  . Colon polyps Neg Hx   . Esophageal cancer Neg Hx   . Rectal cancer Neg Hx   .  Stomach cancer Neg Hx   . Prostate cancer Neg Hx     Social History   Socioeconomic History  . Marital status: Married    Spouse name: Mary  . Number of children: 0  . Years of education: Not on file  . Highest education level: Not on file  Occupational History  . Occupation: semi retired    Fish farm manager: Education administrator    Employer: RETIRED  Tobacco Use  . Smoking status: Never Smoker  . Smokeless tobacco: Never Used  Substance and Sexual Activity  . Alcohol use: Yes    Alcohol/week: 0.0 standard drinks    Comment: once monthly "maybe"  . Drug use: No  . Sexual activity: Yes    Comment: lives with wife, travels frequently, no major dietary restrictions. exercises regularly with aerobic, stretch and weights  Other Topics Concern  . Not on file  Social History Narrative  . Not on file   Social Determinants of Health    Financial Resource Strain:   . Difficulty of Paying Living Expenses: Not on file  Food Insecurity:   . Worried About Charity fundraiser in the Last Year: Not on file  . Ran Out of Food in the Last Year: Not on file  Transportation Needs:   . Lack of Transportation (Medical): Not on file  . Lack of Transportation (Non-Medical): Not on file  Physical Activity:   . Days of Exercise per Week: Not on file  . Minutes of Exercise per Session: Not on file  Stress:   . Feeling of Stress : Not on file  Social Connections:   . Frequency of Communication with Friends and Family: Not on file  . Frequency of Social Gatherings with Friends and Family: Not on file  . Attends Religious Services: Not on file  . Active Member of Clubs or Organizations: Not on file  . Attends Archivist Meetings: Not on file  . Marital Status: Not on file  Intimate Partner Violence:   . Fear of Current or Ex-Partner: Not on file  . Emotionally Abused: Not on file  . Physically Abused: Not on file  . Sexually Abused: Not on file    Outpatient Medications Prior to Visit  Medication Sig Dispense Refill  . brimonidine-timolol (COMBIGAN) 0.2-0.5 % ophthalmic solution Place 1 drop into the right eye 2 (two) times daily.     . brinzolamide (AZOPT) 1 % ophthalmic suspension Place 1 drop into the right eye 3 (three) times daily.    . famotidine (PEPCID) 20 MG tablet TAKE 1 TABLET DAILY AS NEEDED FOR HEARTBURN OR INDIGESTION 90 tablet 3  . glyBURIDE (DIABETA) 2.5 MG tablet Take 1 tablet (2.5 mg total) by mouth 2 (two) times daily with a meal. 180 tablet 1  . ibuprofen (ADVIL) 800 MG tablet Take 800 mg by mouth every 8 (eight) hours as needed.    Marland Kitchen ipratropium (ATROVENT) 0.03 % nasal spray Place 2 sprays into both nostrils 2 (two) times daily as needed for rhinitis. 90 mL 1  . JANUVIA 100 MG tablet TAKE 1 TABLET DAILY 90 tablet 3  . metFORMIN (GLUCOPHAGE) 1000 MG tablet TAKE 1 TABLET TWICE A DAY AND ONE-HALF  (1/2) TABLET EVERY NOON 225 tablet 3  . RESTASIS 0.05 % ophthalmic emulsion Place 1 drop into both eyes 2 (two) times daily.     . silodosin (RAPAFLO) 8 MG CAPS capsule Take 1 capsule (8 mg total) by mouth daily after supper. (Patient taking differently: Take 8 mg  by mouth 2 (two) times daily. ) 30 capsule 5  . simvastatin (ZOCOR) 10 MG tablet TAKE 1 TABLET AT BEDTIME 90 tablet 3  . travoprost, benzalkonium, (TRAVATAN) 0.004 % ophthalmic solution 1 drop at bedtime.      . TRUE METRIX BLOOD GLUCOSE TEST test strip TEST EVERY DAY 100 strip 99  . valACYclovir (VALTREX) 1000 MG tablet TAKE 1 TABLET DAILY (Patient taking differently: Take 500 mg by mouth daily. ) 90 tablet 3  . vardenafil (LEVITRA) 20 MG tablet Take 1 tablet (20 mg total) by mouth daily as needed. 30 tablet 2  . [employer] LANCETS MISC Use as directed once daily to check blood sugar.  DX E11.9 100 each 6  . acetaminophen (TYLENOL) 650 MG CR tablet Take 650 mg by mouth every 8 (eight) hours as needed for pain.     No facility-administered medications prior to visit.    Allergies  Allergen Reactions  . Levaquin [Levofloxacin In D5w] Hives    Review of Systems  Constitutional: Negative for fever and malaise/fatigue.  HENT: Negative for congestion.   Eyes: Negative for blurred vision.  Respiratory: Negative for shortness of breath.   Cardiovascular: Negative for chest pain, palpitations and leg swelling.  Gastrointestinal: Negative for abdominal pain, blood in stool and nausea.  Genitourinary: Positive for dysuria, frequency and urgency.  Musculoskeletal: Negative for falls.  Skin: Negative for rash.  Neurological: Negative for dizziness, loss of consciousness and headaches.  Endo/Heme/Allergies: Negative for environmental allergies.  Psychiatric/Behavioral: Negative for depression. The patient is not nervous/anxious.        Objective:    Physical Exam unable to obtain on phone visit  BP (!) 169/100 Comment: pt reported   Pulse 89 Comment: pt reported  Ht 6' (1.829 m)   BMI 23.06 kg/m  Wt Readings from Last 3 Encounters:  03/15/19 170 lb (77.1 kg)  02/18/19 166 lb (75.3 kg)  11/16/18 165 lb (74.8 kg)    Diabetic Foot Exam - Simple   No data filed     Lab Results  Component Value Date   WBC 7.0 06/17/2019   HGB 13.4 06/17/2019   HCT 39.9 06/17/2019   PLT 247.0 06/17/2019   GLUCOSE 276 (H) 06/17/2019   CHOL 170 06/17/2019   TRIG 110.0 06/17/2019   HDL 58.50 06/17/2019   LDLCALC 90 06/17/2019   ALT 17 06/17/2019   AST 14 06/17/2019   NA 138 06/17/2019   K 4.6 06/17/2019   CL 103 06/17/2019   CREATININE 1.35 06/17/2019   BUN 23 06/17/2019   CO2 26 06/17/2019   TSH 1.32 06/17/2019   PSA 6.05 (H) 12/03/2017   HGBA1C 6.8 (H) 06/17/2019   MICROALBUR <0.7 02/11/2019    Lab Results  Component Value Date   TSH 1.32 06/17/2019   Lab Results  Component Value Date   WBC 7.0 06/17/2019   HGB 13.4 06/17/2019   HCT 39.9 06/17/2019   MCV 93.7 06/17/2019   PLT 247.0 06/17/2019   Lab Results  Component Value Date   NA 138 06/17/2019   K 4.6 06/17/2019   CO2 26 06/17/2019   GLUCOSE 276 (H) 06/17/2019   BUN 23 06/17/2019   CREATININE 1.35 06/17/2019   BILITOT 0.5 06/17/2019   ALKPHOS 61 06/17/2019   AST 14 06/17/2019   ALT 17 06/17/2019   PROT 6.9 06/17/2019   ALBUMIN 4.3 06/17/2019   CALCIUM 9.5 06/17/2019   GFR 50.55 (L) 06/17/2019   Lab Results  Component Value Date  CHOL 170 06/17/2019   Lab Results  Component Value Date   HDL 58.50 06/17/2019   Lab Results  Component Value Date   LDLCALC 90 06/17/2019   Lab Results  Component Value Date   TRIG 110.0 06/17/2019   Lab Results  Component Value Date   CHOLHDL 3 06/17/2019   Lab Results  Component Value Date   HGBA1C 6.8 (H) 06/17/2019       Assessment & Plan:   Problem List Items Addressed This Visit    Diabetes mellitus type 2 in nonobese ([place])    hgba1c acceptable, minimize simple carbs. Increase  exercise as tolerated. Continue current meds. Can continue Glyburide at bid unless he has recurrent hypoglycemia. Refill given.       Hyperlipidemia    Encouraged heart healthy diet, increase exercise, avoid trans fats, consider a krill oil cap daily. Tolerating Simvastatin.       Back pain    No flare with urinary symptoms. Encouraged moist heat and gentle stretching as tolerated. May try NSAIDs and prescription meds as directed and report if symptoms worsen or seek immediate care      Urinary frequency - Primary    Check urine culture and started on Amoxicillin. He will attempt to contact his urologist before he decides to start the medication.       Relevant Orders   Urinalysis (LAB COLLECT)   Urine culture (Completed)      I have discontinued Iona Beard L. Kahre's acetaminophen. I am also having him start on amoxicillin. Additionally, I am having him maintain his travoprost (benzalkonium), Restasis, brimonidine-timolol, [employer] Lancets, brinzolamide, vardenafil, ipratropium, Januvia, simvastatin, True Metrix Blood Glucose Test, ibuprofen, famotidine, metFORMIN, silodosin, valACYclovir, and glyBURIDE.  Meds ordered this encounter  Medications  . amoxicillin (AMOXIL) 500 MG capsule    Sig: Take 1 capsule (500 mg total) by mouth 3 (three) times daily.    Dispense:  21 capsule    Refill:  0    I discussed the assessment and treatment plan with the patient. The patient was provided an opportunity to ask questions and all were answered. The patient agreed with the plan and demonstrated an understanding of the instructions.   The patient was advised to call back or seek an in-person evaluation if the symptoms worsen or if the condition fails to improve as anticipated.  I provided 25 minutes of non-face-to-face time during this encounter.   Penni Homans, MD

## 2019-06-20 NOTE — Assessment & Plan Note (Signed)
hgba1c acceptable, minimize simple carbs. Increase exercise as tolerated. Continue current meds. Can continue Glyburide at bid unless he has recurrent hypoglycemia. Refill given.

## 2019-06-20 NOTE — Assessment & Plan Note (Signed)
No flare with urinary symptoms. Encouraged moist heat and gentle stretching as tolerated. May try NSAIDs and prescription meds as directed and report if symptoms worsen or seek immediate care

## 2019-06-22 ENCOUNTER — Other Ambulatory Visit: Payer: Medicare Other

## 2019-06-26 NOTE — Progress Notes (Signed)
  Radiation Oncology         [phone] ________________________________  Name: MOISES KOLTON MRN: [id number]  Date: 06/09/2019  DOB: 1935/04/04  End of Treatment Note  Diagnosis:   84 y.o. gentleman with Stage T1c adenocarcinoma of the prostate with Gleason score of 4+3, and PSA of 8.72     Indication for treatment:  Curative, Definitive Radiotherapy       Radiation treatment dates:   05/02/19-06/09/19  Site/dose:   The prostate was treated to 70 Gy in 28 fractions of 2.5 Gy  Beams/energy:   The patient was treated with IMRT using volumetric arc therapy delivering 6 MV X-rays to clockwise and counterclockwise circumferential arcs with a 90 degree collimator offset to avoid dose scalloping.  Image guidance was performed with daily cone beam CT prior to each fraction to align to gold markers in the prostate and assure proper bladder and rectal fill volumes.  Immobilization was achieved with BodyFix custom mold.  Narrative: The patient tolerated radiation treatment relatively well.   The patient experienced some minor urinary irritation and modest fatigue.    Plan: The patient has completed radiation treatment. He will return to radiation oncology clinic for routine followup in one month. I advised him to call or return sooner if he has any questions or concerns related to his recovery or treatment. ________________________________  Sheral Apley. Tammi Klippel, M.D.

## 2019-07-14 ENCOUNTER — Ambulatory Visit
Admission: RE | Admit: 2019-07-14 | Discharge: 2019-07-14 | Disposition: A | Discharge status: Home / Self Care | Payer: Medicare Other | Source: Ambulatory Visit | Attending: Urology | Admitting: Urology

## 2019-07-14 DIAGNOSIS — C61 Malignant neoplasm of prostate: Secondary | ICD-10-CM

## 2019-07-14 NOTE — Progress Notes (Signed)
Radiation Oncology         [phone] ________________________________  Name: LELEND AGUINO MRN: [id number]  Date: 07/14/2019  DOB: 1935-02-18  Post Treatment Note  CC: Mosie Lukes, MD  Mosie Lukes, MD  Diagnosis:   85 y.o. gentleman with Stage T1c adenocarcinoma of the prostate with Gleason score of 4+3, and PSA of 8.72     Interval Since Last Radiation:  5 weeks  05/02/19-06/09/19:  The prostate was treated to 70 Gy in 28 fractions of 2.5 Gy  Narrative:  I spoke with the patient to conduct his routine scheduled 1 month follow up visit via telephone to spare the patient unnecessary potential exposure in the healthcare setting during the current COVID-19 pandemic.  The patient was notified in advance and gave permission to proceed with this visit format. He tolerated radiation treatment relatively well with only minor urinary irritation and modest fatigue. He did experience some weak stream, nocturia 3x/night, intermittent urgency and feelings of incomplete bladder emptying throughout the course of radiation.  He denied any gross hematuria, dysuria, abdominal pain or bowel issues.                        On review of systems, the patient states that he is doing very well in general.  He reports that his LUTS are gradually improving and at this point, feels that he is almost back to his baseline.  He specifically denies gross hematuria, dysuria, excessive daytime frequency, straining to void or incontinence.  He feels that he is emptying his bladder well with voiding and reports a steady stream.  He has continued taking Rapaflo daily as well as 1/2 tablet of oxybutynin at night to help manage the nocturia.  He is not even sure at this point if he needs to continue these medications but was advised by Jiles Crocker, NP at his most recent visit 2 weeks ago to continue taking this until follow-up with Dr. Diona Fanti in March which he is comfortable with.  He is tolerating the medications just  fine at the current dosages.  He denies abdominal pain, nausea, vomiting, diarrhea or constipation.  He reports a healthy appetite and is maintaining his weight.  He denies any significant fatigue and is overall pleased with his progress to date.  ALLERGIES:  is allergic to levaquin [levofloxacin in d5w].  Meds: Current Outpatient Medications  Medication Sig Dispense Refill  . amoxicillin (AMOXIL) 500 MG capsule Take 1 capsule (500 mg total) by mouth 3 (three) times daily. 21 capsule 0  . brimonidine-timolol (COMBIGAN) 0.2-0.5 % ophthalmic solution Place 1 drop into the right eye 2 (two) times daily.     . brinzolamide (AZOPT) 1 % ophthalmic suspension Place 1 drop into the right eye 3 (three) times daily.    . famotidine (PEPCID) 20 MG tablet TAKE 1 TABLET DAILY AS NEEDED FOR HEARTBURN OR INDIGESTION 90 tablet 3  . glyBURIDE (DIABETA) 2.5 MG tablet Take 1 tablet (2.5 mg total) by mouth 2 (two) times daily with a meal. 180 tablet 1  . ibuprofen (ADVIL) 800 MG tablet Take 800 mg by mouth every 8 (eight) hours as needed.    Marland Kitchen ipratropium (ATROVENT) 0.03 % nasal spray Place 2 sprays into both nostrils 2 (two) times daily as needed for rhinitis. 90 mL 1  . JANUVIA 100 MG tablet TAKE 1 TABLET DAILY 90 tablet 3  . metFORMIN (GLUCOPHAGE) 1000 MG tablet TAKE 1 TABLET TWICE A DAY  AND ONE-HALF (1/2) TABLET EVERY NOON 225 tablet 3  . RESTASIS 0.05 % ophthalmic emulsion Place 1 drop into both eyes 2 (two) times daily.     . silodosin (RAPAFLO) 8 MG CAPS capsule Take 1 capsule (8 mg total) by mouth daily after supper. (Patient taking differently: Take 8 mg by mouth 2 (two) times daily. ) 30 capsule 5  . simvastatin (ZOCOR) 10 MG tablet TAKE 1 TABLET AT BEDTIME 90 tablet 3  . travoprost, benzalkonium, (TRAVATAN) 0.004 % ophthalmic solution 1 drop at bedtime.      . TRUE METRIX BLOOD GLUCOSE TEST test strip TEST EVERY DAY 100 strip 99  . valACYclovir (VALTREX) 1000 MG tablet TAKE 1 TABLET DAILY (Patient taking  differently: Take 500 mg by mouth daily. ) 90 tablet 3  . vardenafil (LEVITRA) 20 MG tablet Take 1 tablet (20 mg total) by mouth daily as needed. 30 tablet 2  . [employer] LANCETS MISC Use as directed once daily to check blood sugar.  DX E11.9 100 each 6   No current facility-administered medications for this encounter.    Physical Findings:  vitals were not taken for this visit.   /Unable to assess due to telephone follow up visit format.  Lab Findings: Lab Results  Component Value Date   WBC 7.0 06/17/2019   HGB 13.4 06/17/2019   HCT 39.9 06/17/2019   MCV 93.7 06/17/2019   PLT 247.0 06/17/2019     Radiographic Findings: No results found.  Impression/Plan: 1. 85 y.o. gentleman with Stage T1c adenocarcinoma of the prostate with Gleason score of 4+3, and PSA of 8.72 He will continue to follow up with urology for ongoing PSA determinations and has an appointment scheduled with Dr. Diona Fanti on 09/21/19. He understands what to expect with regards to PSA monitoring going forward. I will look forward to following his response to treatment via correspondence with urology, and would be happy to continue to participate in his care if clinically indicated. I talked to the patient about what to expect in the future, including his risk for erectile dysfunction and rectal bleeding. I encouraged him to call or return to the office if he has any questions regarding his previous radiation or possible radiation side effects. He was comfortable with this plan and will follow up as needed.Marland Kitchenawb    Nicholos Johns, PA-C

## 2019-07-15 ENCOUNTER — Encounter: Payer: Self-pay | Admitting: Family Medicine

## 2019-07-15 ENCOUNTER — Other Ambulatory Visit: Payer: Self-pay

## 2019-07-15 ENCOUNTER — Ambulatory Visit (INDEPENDENT_AMBULATORY_CARE_PROVIDER_SITE_OTHER): Payer: Medicare Other | Admitting: Family Medicine

## 2019-07-15 VITALS — BP 130/70 | Temp 98.1°F | Wt 168.0 lb

## 2019-07-15 DIAGNOSIS — C61 Malignant neoplasm of prostate: Secondary | ICD-10-CM

## 2019-07-15 DIAGNOSIS — R350 Frequency of micturition: Secondary | ICD-10-CM | POA: Diagnosis not present

## 2019-07-15 DIAGNOSIS — E782 Mixed hyperlipidemia: Secondary | ICD-10-CM

## 2019-07-15 DIAGNOSIS — E119 Type 2 diabetes mellitus without complications: Secondary | ICD-10-CM

## 2019-07-15 NOTE — Assessment & Plan Note (Signed)
He is currently on Rapiflo but has dropped himself from 1 tab twice daily to 1/2 tab daily and will discuss more with urology as he feels it makes him too dry.

## 2019-07-15 NOTE — Assessment & Plan Note (Signed)
He has just finished his 28 day course of radiation and he notes his urination is improving significantly. No acute concerns and he continues to follow with radiation oncology, oncology and urology

## 2019-07-15 NOTE — Progress Notes (Addendum)
Patient ID: KANYON GRUN, male   DOB: 05/16/1935, 85 y.o.   MRN: [id number] Virtual Visit via phone Note  I connected with Rifat Fink Dacquisto on 07/15/19 at  8:40 AM EST by a phone enabled telemedicine application and verified that I am speaking with the correct person using two identifiers.  Location: Patient: home Provider: home   I discussed the limitations of evaluation and management by telemedicine and the availability of in person appointments. The patient expressed understanding and agreed to proceed. [name], CMA was able to get the patient set up on a visit, phone after being unable to set up a video visit   Subjective:    Patient ID: XABIAN ZAITZ, male    DOB: June 26, 1935, 85 y.o.   MRN: [id number]  No chief complaint on file.   HPI Patient is in today for follow up onc hronic medical concerns including diabetes and prostate cancer. No recent febrile illnss or hospitalizations. No polyuria or polydipsia. His urinary frequency is much better since his prostate cancer treatments. He has completed a 28 day course of radiation. His sugars are improved since finishing radiation. Sugar yesterday was 122 and today 157. Denies CP/palp/SOB/HA/congestion/fevers/GI or GU c/o. Taking meds as prescribed  Past Medical History:  Diagnosis Date  . Allergy    mild  . Arthritis    fingers, hip  . BPH (benign prostatic hypertrophy)   . Cataract    removed both eyes  . Diabetes mellitus 2000   type 2  . Diabetes mellitus type 2 in nonobese [facility]) 06/19/2009   Qualifier: Diagnosis of  By: Wynona Luna    . GERD (gastroesophageal reflux disease)   . Glaucoma    followed by Dr Lanell Matar at [facility]  . Hyperlipidemia   . Left hip pain 03/14/2012  . Malignant neoplasm of prostate ([place]) 03/15/2019  . Medicare annual wellness visit, subsequent 12/31/2015  . Onychomycosis 09/17/2015  . Prostate cancer ([place])   . Tubular adenoma of colon 2003  . Vasomotor rhinitis 05/02/2013     Past Surgical History:  Procedure Laterality Date  . APPENDECTOMY  1943  . CATARACT EXTRACTION, BILATERAL    . COLONOSCOPY    . CYSTOSCOPY WITH INSERTION OF UROLIFT    . EYE SURGERY     laser surgery on left numerous times.   Marland Kitchen POLYPECTOMY    . TRABECULECTOMY Left 09/04/2016    Family History  Problem Relation Age of Onset  . Obstructive Sleep Apnea Sister   . Heart disease Father        MI  . Diabetes Father   . Arthritis Brother        knees  . Heart disease Brother        a fib  . Breast cancer Sister   . Colon cancer Paternal Uncle 62  . Colon polyps Neg Hx   . Esophageal cancer Neg Hx   . Rectal cancer Neg Hx   . Stomach cancer Neg Hx   . Prostate cancer Neg Hx     Social History   Socioeconomic History  . Marital status: Married    Spouse name: Mary  . Number of children: 0  . Years of education: Not on file  . Highest education level: Not on file  Occupational History  . Occupation: semi retired    Fish farm manager: Education administrator    Employer: RETIRED  Tobacco Use  . Smoking status: Never Smoker  . Smokeless tobacco: Never Used  Substance and Sexual  Activity  . Alcohol use: Yes    Alcohol/week: 0.0 standard drinks    Comment: once monthly "maybe"  . Drug use: No  . Sexual activity: Yes    Comment: lives with wife, travels frequently, no major dietary restrictions. exercises regularly with aerobic, stretch and weights  Other Topics Concern  . Not on file  Social History Narrative  . Not on file   Social Determinants of Health   Financial Resource Strain:   . Difficulty of Paying Living Expenses: Not on file  Food Insecurity:   . Worried About Charity fundraiser in the Last Year: Not on file  . Ran Out of Food in the Last Year: Not on file  Transportation Needs:   . Lack of Transportation (Medical): Not on file  . Lack of Transportation (Non-Medical): Not on file  Physical Activity:   . Days of Exercise per Week: Not on file  . Minutes of Exercise  per Session: Not on file  Stress:   . Feeling of Stress : Not on file  Social Connections:   . Frequency of Communication with Friends and Family: Not on file  . Frequency of Social Gatherings with Friends and Family: Not on file  . Attends Religious Services: Not on file  . Active Member of Clubs or Organizations: Not on file  . Attends Archivist Meetings: Not on file  . Marital Status: Not on file  Intimate Partner Violence:   . Fear of Current or Ex-Partner: Not on file  . Emotionally Abused: Not on file  . Physically Abused: Not on file  . Sexually Abused: Not on file    Outpatient Medications Prior to Visit  Medication Sig Dispense Refill  . brimonidine-timolol (COMBIGAN) 0.2-0.5 % ophthalmic solution Place 1 drop into the right eye 2 (two) times daily.     . brinzolamide (AZOPT) 1 % ophthalmic suspension Place 1 drop into the right eye 3 (three) times daily.    . famotidine (PEPCID) 20 MG tablet TAKE 1 TABLET DAILY AS NEEDED FOR HEARTBURN OR INDIGESTION 90 tablet 3  . glyBURIDE (DIABETA) 2.5 MG tablet Take 1 tablet (2.5 mg total) by mouth 2 (two) times daily with a meal. 180 tablet 1  . ibuprofen (ADVIL) 800 MG tablet Take 800 mg by mouth every 8 (eight) hours as needed.    Marland Kitchen ipratropium (ATROVENT) 0.03 % nasal spray Place 2 sprays into both nostrils 2 (two) times daily as needed for rhinitis. 90 mL 1  . JANUVIA 100 MG tablet TAKE 1 TABLET DAILY 90 tablet 3  . metFORMIN (GLUCOPHAGE) 1000 MG tablet TAKE 1 TABLET TWICE A DAY AND ONE-HALF (1/2) TABLET EVERY NOON 225 tablet 3  . RESTASIS 0.05 % ophthalmic emulsion Place 1 drop into both eyes 2 (two) times daily.     . silodosin (RAPAFLO) 8 MG CAPS capsule Take 1 capsule (8 mg total) by mouth daily after supper. (Patient taking differently: Take 8 mg by mouth 2 (two) times daily. ) 30 capsule 5  . simvastatin (ZOCOR) 10 MG tablet TAKE 1 TABLET AT BEDTIME 90 tablet 3  . travoprost, benzalkonium, (TRAVATAN) 0.004 % ophthalmic  solution 1 drop at bedtime.      . TRUE METRIX BLOOD GLUCOSE TEST test strip TEST EVERY DAY 100 strip 99  . valACYclovir (VALTREX) 1000 MG tablet TAKE 1 TABLET DAILY (Patient taking differently: Take 500 mg by mouth daily. ) 90 tablet 3  . vardenafil (LEVITRA) 20 MG tablet Take 1 tablet (  20 mg total) by mouth daily as needed. 30 tablet 2  . [employer] LANCETS MISC Use as directed once daily to check blood sugar.  DX E11.9 100 each 6  . amoxicillin (AMOXIL) 500 MG capsule Take 1 capsule (500 mg total) by mouth 3 (three) times daily. 21 capsule 0   No facility-administered medications prior to visit.    Allergies  Allergen Reactions  . Levaquin [Levofloxacin In D5w] Hives    Review of Systems  Constitutional: Negative for fever and malaise/fatigue.  HENT: Negative for congestion.   Eyes: Negative for blurred vision.  Respiratory: Negative for shortness of breath.   Cardiovascular: Negative for chest pain, palpitations and leg swelling.  Gastrointestinal: Negative for abdominal pain, blood in stool and nausea.  Genitourinary: Positive for frequency. Negative for dysuria.  Musculoskeletal: Negative for falls.  Skin: Negative for rash.  Neurological: Negative for dizziness, loss of consciousness and headaches.  Endo/Heme/Allergies: Negative for environmental allergies.  Psychiatric/Behavioral: Negative for depression. The patient is not nervous/anxious.        Objective:    Physical Exam unable to obtain via phone  BP 130/70   Temp 98.1 F (36.7 C) (Oral)   Wt 168 lb (76.2 kg)   SpO2 98%   BMI 22.78 kg/m  Wt Readings from Last 3 Encounters:  07/15/19 168 lb (76.2 kg)  03/15/19 170 lb (77.1 kg)  02/18/19 166 lb (75.3 kg)    Diabetic Foot Exam - Simple   No data filed     Lab Results  Component Value Date   WBC 7.0 06/17/2019   HGB 13.4 06/17/2019   HCT 39.9 06/17/2019   PLT 247.0 06/17/2019   GLUCOSE 276 (H) 06/17/2019   CHOL 170 06/17/2019   TRIG 110.0 06/17/2019    HDL 58.50 06/17/2019   LDLCALC 90 06/17/2019   ALT 17 06/17/2019   AST 14 06/17/2019   NA 138 06/17/2019   K 4.6 06/17/2019   CL 103 06/17/2019   CREATININE 1.35 06/17/2019   BUN 23 06/17/2019   CO2 26 06/17/2019   TSH 1.32 06/17/2019   PSA 6.05 (H) 12/03/2017   HGBA1C 6.8 (H) 06/17/2019   MICROALBUR <0.7 02/11/2019    Lab Results  Component Value Date   TSH 1.32 06/17/2019   Lab Results  Component Value Date   WBC 7.0 06/17/2019   HGB 13.4 06/17/2019   HCT 39.9 06/17/2019   MCV 93.7 06/17/2019   PLT 247.0 06/17/2019   Lab Results  Component Value Date   NA 138 06/17/2019   K 4.6 06/17/2019   CO2 26 06/17/2019   GLUCOSE 276 (H) 06/17/2019   BUN 23 06/17/2019   CREATININE 1.35 06/17/2019   BILITOT 0.5 06/17/2019   ALKPHOS 61 06/17/2019   AST 14 06/17/2019   ALT 17 06/17/2019   PROT 6.9 06/17/2019   ALBUMIN 4.3 06/17/2019   CALCIUM 9.5 06/17/2019   GFR 50.55 (L) 06/17/2019   Lab Results  Component Value Date   CHOL 170 06/17/2019   Lab Results  Component Value Date   HDL 58.50 06/17/2019   Lab Results  Component Value Date   LDLCALC 90 06/17/2019   Lab Results  Component Value Date   TRIG 110.0 06/17/2019   Lab Results  Component Value Date   CHOLHDL 3 06/17/2019   Lab Results  Component Value Date   HGBA1C 6.8 (H) 06/17/2019       Assessment & Plan:   Problem List Items Addressed This Visit  Diabetes mellitus type 2 in nonobese [facility]) - Primary    Numbers improving now that he has completed his Prostate cancer treatments. His this morning was 157 and yesterday was 122, he will continue current meds and monitor closely. He will let us know if his sugars fluctuate significantly. Maintain diabetic diet and minimize carbohydrate intake.       Relevant Orders   A1C   CBC   CMP   Lipid panel   TSH   Hyperlipidemia   Relevant Orders   Lipid panel   TSH   Malignant neoplasm of prostate ([place])    He has just finished his 28 day course  of radiation and he notes his urination is improving significantly. No acute concerns and he continues to follow with radiation oncology, oncology and urology      Urinary frequency    He is currently on Rapiflo but has dropped himself from 1 tab twice daily to 1/2 tab daily and will discuss more with urology as he feels it makes him too dry.          I have discontinued Iona Beard L. Zurawski's amoxicillin. I am also having him maintain his travoprost (benzalkonium), Restasis, brimonidine-timolol, [employer] Lancets, brinzolamide, vardenafil, ipratropium, Januvia, simvastatin, True Metrix Blood Glucose Test, ibuprofen, famotidine, metFORMIN, silodosin, valACYclovir, and glyBURIDE.  No orders of the defined types were placed in this encounter.    I discussed the assessment and treatment plan with the patient. The patient was provided an opportunity to ask questions and all were answered. The patient agreed with the plan and demonstrated an understanding of the instructions.   The patient was advised to call back or seek an in-person evaluation if the symptoms worsen or if the condition fails to improve as anticipated.  I provided 25 minutes of non-face-to-face time during this encounter.   Penni Homans, MD

## 2019-07-15 NOTE — Assessment & Plan Note (Signed)
Numbers improving now that he has completed his Prostate cancer treatments. His this morning was 157 and yesterday was 122, he will continue current meds and monitor closely. He will let us know if his sugars fluctuate significantly. Maintain diabetic diet and minimize carbohydrate intake.

## 2019-07-15 NOTE — Progress Notes (Signed)
Just completed a 28 day cancer treatment for PSA, feels his blood sugar is going back to normal he stated yesterday it was around 150-200 and he feels great

## 2019-07-18 ENCOUNTER — Ambulatory Visit: Payer: Medicare Other | Attending: Internal Medicine

## 2019-07-18 DIAGNOSIS — Z23 Encounter for immunization: Secondary | ICD-10-CM | POA: Insufficient documentation

## 2019-07-18 NOTE — Progress Notes (Signed)
   Covid-19 Vaccination Clinic  Name:  RAJU ROUTH    MRN: [id number] DOB: [birth year]  07/18/2019  Mr. Salone was observed post Covid-19 immunization for 15 minutes without incidence. He was provided with Vaccine Information Sheet and instruction to access the V-Safe system.   Mr. Milham was instructed to call 911 with any severe reactions post vaccine: Marland Kitchen Difficulty breathing  . Swelling of your face and throat  . A fast heartbeat  . A bad rash all over your body  . Dizziness and weakness

## 2019-07-27 ENCOUNTER — Telehealth: Payer: Self-pay | Admitting: Family Medicine

## 2019-07-27 DIAGNOSIS — H9193 Unspecified hearing loss, bilateral: Secondary | ICD-10-CM

## 2019-07-27 DIAGNOSIS — Z01118 Encounter for examination of ears and hearing with other abnormal findings: Secondary | ICD-10-CM

## 2019-07-27 NOTE — Telephone Encounter (Signed)
Caller: pt Ph# [phone]  Pt needing referral sent to [facility] in [place], [place] # [phone], fax# [phone]  Pt is wanting to set up appointment for routine hearing screening and they are requesting doctor referral.

## 2019-07-29 NOTE — Telephone Encounter (Signed)
Faxed [facility] at the number provided

## 2019-08-03 LAB — HM DIABETES EYE EXAM

## 2019-08-06 ENCOUNTER — Ambulatory Visit: Payer: Medicare Other | Attending: Internal Medicine

## 2019-08-06 DIAGNOSIS — Z23 Encounter for immunization: Secondary | ICD-10-CM | POA: Insufficient documentation

## 2019-08-06 NOTE — Progress Notes (Signed)
   Covid-19 Vaccination Clinic  Name:  TASHI BRKIC    MRN: [id number] DOB: 1934/07/10  08/06/2019  Mr. Kester was observed post Covid-19 immunization for 30 minutes based on pre-vaccination screening without incidence. He was provided with Vaccine Information Sheet and instruction to access the V-Safe system.   Mr. Gikas was instructed to call 911 with any severe reactions post vaccine: Marland Kitchen Difficulty breathing  . Swelling of your face and throat  . A fast heartbeat  . A bad rash all over your body  . Dizziness and weakness    Immunizations Administered    Name Date Dose VIS Date Route   Pfizer COVID-19 Vaccine 08/06/2019  9:37 AM 0.3 mL 06/17/2019 Intramuscular   Manufacturer: [place]   Lot: [id number]   [place]: [id number]

## 2019-08-08 ENCOUNTER — Encounter: Payer: Self-pay | Admitting: Family Medicine

## 2019-08-15 ENCOUNTER — Other Ambulatory Visit: Payer: Medicare Other

## 2019-08-22 ENCOUNTER — Ambulatory Visit: Payer: Medicare Other | Admitting: Family Medicine

## 2019-09-19 ENCOUNTER — Telehealth: Payer: Self-pay | Admitting: Family Medicine

## 2019-09-20 ENCOUNTER — Other Ambulatory Visit: Payer: Self-pay

## 2019-09-20 ENCOUNTER — Other Ambulatory Visit (INDEPENDENT_AMBULATORY_CARE_PROVIDER_SITE_OTHER): Payer: Medicare Other

## 2019-09-20 DIAGNOSIS — E782 Mixed hyperlipidemia: Secondary | ICD-10-CM

## 2019-09-20 DIAGNOSIS — E119 Type 2 diabetes mellitus without complications: Secondary | ICD-10-CM

## 2019-09-20 LAB — LIPID PANEL
Cholesterol: 155 mg/dL (ref 0–200)
HDL: 54.2 mg/dL (ref >39.00)
LDL Cholesterol: 78 mg/dL (ref 0–99)
NonHDL: 100.34
Total CHOL/HDL Ratio: 3
Triglycerides: 112 mg/dL (ref 0.0–149.0)
VLDL: 22.4 mg/dL (ref 0.0–40.0)

## 2019-09-20 LAB — COMPREHENSIVE METABOLIC PANEL
ALT: 17 U/L (ref 0–53)
AST: 16 U/L (ref 0–37)
Albumin: 4 g/dL (ref 3.5–5.2)
Alkaline Phosphatase: 51 U/L (ref 39–117)
BUN: 19 mg/dL (ref 6–23)
CO2: 26 mEq/L (ref 19–32)
Calcium: 9 mg/dL (ref 8.4–10.5)
Chloride: 105 mEq/L (ref 96–112)
Creatinine, Ser: 1.16 mg/dL (ref 0.40–1.50)
GFR: 60.18 mL/min (ref >60.00)
Glucose, Bld: 152 mg/dL — ABNORMAL HIGH (ref 70–99)
Potassium: 4.7 mEq/L (ref 3.5–5.1)
Sodium: 138 mEq/L (ref 135–145)
Total Bilirubin: 0.5 mg/dL (ref 0.2–1.2)
Total Protein: 6.1 g/dL (ref 6.0–8.3)

## 2019-09-20 LAB — CBC
HCT: 37.9 % — ABNORMAL LOW (ref 39.0–52.0)
Hemoglobin: 12.9 g/dL — ABNORMAL LOW (ref 13.0–17.0)
MCHC: 33.9 g/dL (ref 30.0–36.0)
MCV: 93.6 fl (ref 78.0–100.0)
Platelets: 219 10*3/uL (ref 150.0–400.0)
RBC: 4.05 Mil/uL — ABNORMAL LOW (ref 4.22–5.81)
RDW: 13.9 % (ref 11.5–15.5)
WBC: 5.9 10*3/uL (ref 4.0–10.5)

## 2019-09-20 LAB — HEMOGLOBIN A1C: Hgb A1c MFr Bld: 6.5 % (ref 4.6–6.5)

## 2019-09-20 LAB — TSH: TSH: 1.99 u[IU]/mL (ref 0.35–4.50)

## 2019-09-26 ENCOUNTER — Other Ambulatory Visit: Payer: Self-pay

## 2019-09-26 ENCOUNTER — Ambulatory Visit (INDEPENDENT_AMBULATORY_CARE_PROVIDER_SITE_OTHER): Payer: Medicare Other | Admitting: Family Medicine

## 2019-09-26 DIAGNOSIS — C61 Malignant neoplasm of prostate: Secondary | ICD-10-CM

## 2019-09-26 DIAGNOSIS — E119 Type 2 diabetes mellitus without complications: Secondary | ICD-10-CM | POA: Diagnosis not present

## 2019-09-26 DIAGNOSIS — M25552 Pain in left hip: Secondary | ICD-10-CM | POA: Diagnosis not present

## 2019-09-26 DIAGNOSIS — E782 Mixed hyperlipidemia: Secondary | ICD-10-CM | POA: Diagnosis not present

## 2019-09-26 NOTE — Progress Notes (Signed)
Virtual Visit via phone Note  I connected with [name] on 09/26/19 at  3:00 PM EDT by a phone enabled telemedicine application and verified that I am speaking with the correct person using two identifiers.  Location: Patient: home Provider: home   I discussed the limitations of evaluation and management by telemedicine and the availability of in person appointments. The patient expressed understanding and agreed to proceed. Kem Boroughs, CMA was able to get the patient set up on a visit, phone after being unable to set up a video visit   Subjective:    Patient ID: [name], male    DOB: 29-May-1935, 85 y.o.   MRN: [id number]  Chief Complaint  Patient presents with  . 6 month follow up    HPI Patient is in today for follow up on chronic medical concerns. He has tolerated his prostate cancer treatments, radiation and he feels he has completely recovered. He is looking forward to now working with Dr Alvan Dame of orthopaedics to get his THR. No recent febrile illness or hospitalizations. He has been eating well and staying as active as he is able. He has had and tolerated his COVID shot. Denies CP/palp/SOB/HA/congestion/fevers/GI or GU c/o. Taking meds as prescribed  Past Medical History:  Diagnosis Date  . Allergy    mild  . Arthritis    fingers, hip  . BPH (benign prostatic hypertrophy)   . Cataract    removed both eyes  . Diabetes mellitus 2000   type 2  . Diabetes mellitus type 2 in nonobese [facility]) 06/19/2009   Qualifier: Diagnosis of  By: Wynona Luna    . GERD (gastroesophageal reflux disease)   . Glaucoma    follow[facility] by Dr Lanell Matar at [facility]  . Hyperlipidemia   . Left hip pain 03/14/2012  . Malignant neoplasm of prostate ([place]) 03/15/2019  . Medicare annual wellness visit, subsequent 12/31/2015  . Onychomycosis 09/17/2015  . Prostate cancer ([place])   . Tubular adenoma of colon 2003  . Vasomotor rhinitis 05/02/2013    Past Surgical History:    Procedure Laterality Date  . APPENDECTOMY  1943  . CATARACT EXTRACTION, BILATERAL    . COLONOSCOPY    . CYSTOSCOPY WITH INSERTION OF UROLIFT    . EYE SURGERY     laser surgery on left numerous times.   Marland Kitchen POLYPECTOMY    . TRABECULECTOMY Left 09/04/2016    Family History  Problem Relation Age of Onset  . Obstructive Sleep Apnea Sister   . Heart disease Father        MI  . Diabetes Father   . Arthritis Brother        knees  . Heart disease Brother        a fib  . Breast cancer Sister   . Colon cancer Paternal Uncle 26  . Colon polyps Neg Hx   . Esophageal cancer Neg Hx   . Rectal cancer Neg Hx   . Stomach cancer Neg Hx   . Prostate cancer Neg Hx     Social History   Socioeconomic History  . Marital status: Married    Spouse name: Mary  . Number of children: 0  . Years of education: Not on file  . Highest education level: Not on file  Occupational History  . Occupation: semi retired    Fish farm manager: Education administrator    Employer: RETIRED  Tobacco Use  . Smoking status: Never Smoker  . Smokeless tobacco: Never Used  Substance  and Sexual Activity  . Alcohol use: Yes    Alcohol/week: 0.0 standard drinks    Comment: once monthly "maybe"  . Drug use: No  . Sexual activity: Yes    Comment: lives with wife, travels frequently, no major dietary restrictions. exercises regularly with aerobic, stretch and weights  Other Topics Concern  . Not on file  Social History Narrative  . Not on file   Social Determinants of Health   Financial Resource Strain:   . Difficulty of Paying Living Expenses:   Food Insecurity:   . Worried About Charity fundraiser in the Last Year:   . Arboriculturist in the Last Year:   Transportation Needs:   . Film/video editor (Medical):   Marland Kitchen Lack of Transportation (Non-Medical):   Physical Activity:   . Days of Exercise per Week:   . Minutes of Exercise per Session:   Stress:   . Feeling of Stress :   Social Connections:   . Frequency of  Communication with Friends and Family:   . Frequency of Social Gatherings with Friends and Family:   . Attends Religious Services:   . Active Member of Clubs or Organizations:   . Attends Archivist Meetings:   Marland Kitchen Marital Status:   Intimate Partner Violence:   . Fear of Current or Ex-Partner:   . Emotionally Abused:   Marland Kitchen Physically Abused:   . Sexually Abused:     Outpatient Medications Prior to Visit  Medication Sig Dispense Refill  . brimonidine-timolol (COMBIGAN) 0.2-0.5 % ophthalmic solution Place 1 drop into the right eye 2 (two) times daily.     . brinzolamide (AZOPT) 1 % ophthalmic suspension Place 1 drop into the right eye 3 (three) times daily.    . famotidine (PEPCID) 20 MG tablet TAKE 1 TABLET DAILY AS NEEDED FOR HEARTBURN OR INDIGESTION 90 tablet 3  . glyBURIDE (DIABETA) 2.5 MG tablet Take 1 tablet (2.5 mg total) by mouth 2 (two) times daily with a meal. 180 tablet 1  . ibuprofen (ADVIL) 800 MG tablet Take 800 mg by mouth every 8 (eight) hours as needed.    Marland Kitchen ipratropium (ATROVENT) 0.03 % nasal spray Place 2 sprays into both nostrils 2 (two) times daily as needed for rhinitis. 90 mL 1  . JANUVIA 100 MG tablet TAKE 1 TABLET DAILY 90 tablet 3  . metFORMIN (GLUCOPHAGE) 1000 MG tablet TAKE 1 TABLET TWICE A DAY AND ONE-HALF (1/2) TABLET EVERY NOON 225 tablet 3  . RESTASIS 0.05 % ophthalmic emulsion Place 1 drop into both eyes 2 (two) times daily.     . silodosin (RAPAFLO) 8 MG CAPS capsule Take 1 capsule (8 mg total) by mouth daily after supper. (Patient taking differently: Take 8 mg by mouth 2 (two) times daily. ) 30 capsule 5  . simvastatin (ZOCOR) 10 MG tablet TAKE 1 TABLET AT BEDTIME 90 tablet 3  . travoprost, benzalkonium, (TRAVATAN) 0.004 % ophthalmic solution 1 drop at bedtime.      . TRUE METRIX BLOOD GLUCOSE TEST test strip TEST EVERY DAY 100 strip 99  . valACYclovir (VALTREX) 1000 MG tablet TAKE 1 TABLET DAILY (Patient taking differently: Take 500 mg by mouth  daily. ) 90 tablet 3  . vardenafil (LEVITRA) 20 MG tablet Take 1 tablet (20 mg total) by mouth daily as needed. 30 tablet 2  . [employer] LANCETS MISC Use as directed once daily to check blood sugar.  DX E11.9 100 each 6   No  facility-administered medications prior to visit.    Allergies  Allergen Reactions  . Levaquin [Levofloxacin In D5w] Hives    Review of Systems  Constitutional: Negative for fever and malaise/fatigue.  HENT: Negative for congestion.   Eyes: Negative for blurred vision.  Respiratory: Negative for shortness of breath.   Cardiovascular: Negative for chest pain, palpitations and leg swelling.  Gastrointestinal: Negative for abdominal pain, blood in stool and nausea.  Genitourinary: Negative for dysuria and frequency.  Musculoskeletal: Negative for falls.  Skin: Negative for rash.  Neurological: Negative for dizziness, loss of consciousness and headaches.  Endo/Heme/Allergies: Negative for environmental allergies.  Psychiatric/Behavioral: Negative for depression. The patient is not nervous/anxious.        Objective:    Physical Exam unable to obtain via phone visit  BP 140/81  Wt Readings from Last 3 Encounters:  07/15/19 168 lb (76.2 kg)  03/15/19 170 lb (77.1 kg)  02/18/19 166 lb (75.3 kg)    Diabetic Foot Exam - Simple   No data filed     Lab Results  Component Value Date   WBC 5.9 09/20/2019   HGB 12.9 (L) 09/20/2019   HCT 37.9 (L) 09/20/2019   PLT 219.0 09/20/2019   GLUCOSE 152 (H) 09/20/2019   CHOL 155 09/20/2019   TRIG 112.0 09/20/2019   HDL 54.20 09/20/2019   LDLCALC 78 09/20/2019   ALT 17 09/20/2019   AST 16 09/20/2019   NA 138 09/20/2019   K 4.7 09/20/2019   CL 105 09/20/2019   CREATININE 1.16 09/20/2019   BUN 19 09/20/2019   CO2 26 09/20/2019   TSH 1.99 09/20/2019   PSA 6.05 (H) 12/03/2017   HGBA1C 6.5 09/20/2019   MICROALBUR <0.7 02/11/2019    Lab Results  Component Value Date   TSH 1.99 09/20/2019   Lab Results    Component Value Date   WBC 5.9 09/20/2019   HGB 12.9 (L) 09/20/2019   HCT 37.9 (L) 09/20/2019   MCV 93.6 09/20/2019   PLT 219.0 09/20/2019   Lab Results  Component Value Date   NA 138 09/20/2019   K 4.7 09/20/2019   CO2 26 09/20/2019   GLUCOSE 152 (H) 09/20/2019   BUN 19 09/20/2019   CREATININE 1.16 09/20/2019   BILITOT 0.5 09/20/2019   ALKPHOS 51 09/20/2019   AST 16 09/20/2019   ALT 17 09/20/2019   PROT 6.1 09/20/2019   ALBUMIN 4.0 09/20/2019   CALCIUM 9.0 09/20/2019   GFR 60.18 09/20/2019   Lab Results  Component Value Date   CHOL 155 09/20/2019   Lab Results  Component Value Date   HDL 54.20 09/20/2019   Lab Results  Component Value Date   LDLCALC 78 09/20/2019   Lab Results  Component Value Date   TRIG 112.0 09/20/2019   Lab Results  Component Value Date   CHOLHDL 3 09/20/2019   Lab Results  Component Value Date   HGBA1C 6.5 09/20/2019       Assessment & Plan:   Problem List Items Addressed This Visit    Diabetes mellitus type 2 in nonobese ([place])    hgba1c acceptable, minimize simple carbs. Increase exercise as tolerated. Continue current meds. Good improvement in hgba1c      Hyperlipidemia    Encouraged heart healthy diet, increase exercise, avoid trans fats, consider a krill oil cap daily      Left hip pain    He is hoping to proceed with left hip replacement now with Dr Alvan Dame now that he  is recovered from his prostate treatments.       Malignant neoplasm of prostate [facility])    He has tolerated his radiation treatments and he has followed up with urology and they note his PSA is down to 2.59. he denies any persistent urinary symptoms         I am having Iona Beard L. Dukeman maintain his travoprost (benzalkonium), Restasis, brimonidine-timolol, [facility] Lancets, brinzolamide, vardenafil, ipratropium, Januvia, simvastatin, True Metrix Blood Glucose Test, ibuprofen, famotidine, metFORMIN, silodosin, valACYclovir, and glyBURIDE.  No orders of  the defined types were placed in this encounter.    I discussed the assessment and treatment plan with the patient. The patient was provided an opportunity to ask questions and all were answered. The patient agreed with the plan and demonstrated an understanding of the instructions.   The patient was advised to call back or seek an in-person evaluation if the symptoms worsen or if the condition fails to improve as anticipated.  I provided 15 minutes of non-face-to-face time during this encounter.   Penni Homans, MD

## 2019-09-26 NOTE — Assessment & Plan Note (Signed)
He has tolerated his radiation treatments and he has followed up with urology and they note his PSA is down to 2.59. he denies any persistent urinary symptoms

## 2019-09-26 NOTE — Assessment & Plan Note (Signed)
hgba1c acceptable, minimize simple carbs. Increase exercise as tolerated. Continue current meds. Good improvement in hgba1c

## 2019-09-26 NOTE — Assessment & Plan Note (Signed)
He is hoping to proceed with left hip replacement now with Dr Alvan Dame now that he is recovered from his prostate treatments.

## 2019-09-26 NOTE — Assessment & Plan Note (Signed)
Encouraged heart healthy diet, increase exercise, avoid trans fats, consider a krill oil cap daily 

## 2019-10-10 ENCOUNTER — Telehealth: Payer: Self-pay | Admitting: Family Medicine

## 2019-10-10 NOTE — Telephone Encounter (Signed)
Patient notified that we have not received anything as of yet and that our fax machines was down for the majority of the weekend possibly.  He will call Dr. Honor Loh office and have them to resend it.

## 2019-10-10 NOTE — Telephone Encounter (Signed)
Patient is requesting call back in regards to a medical clearance for a procedure. Please advise that Dr Charlett Blake has received the paperwork. Dr .Alvan Dame office sent for [facility] signature   Call Back #   [phone]

## 2019-10-11 NOTE — Telephone Encounter (Signed)
Left message on machine that I did not received form yet.

## 2019-10-13 NOTE — Telephone Encounter (Signed)
Left message on machine that we did receive form and that we will call and let him know on Monday if he will need to be seen or not.

## 2019-10-14 NOTE — Telephone Encounter (Signed)
I have seen him recently and things are stable. I will be able to clear him, my only concern is the mild anemia. Make sure he is taking a multivitamin with iron daily to help.

## 2019-10-14 NOTE — Telephone Encounter (Signed)
Just FYI  Patient called and stated that he will not be back in town until 4/22 and his pre-op appointment is on 4/23.  I explained to him that he may need an appointment.  He would like for you to approved this since you know everything about him.  I spoke with Judeen Hammans at [employer], the scheduler.  She stated that the 4/23 pre-op appt is to meet meet with a PA.  His surgery date is 11/15/19. She stated that the clearance form just need to be returned 1 week prior to surgery.  So if appointment is needed can get him in the week of 4/26.

## 2019-10-18 NOTE — Telephone Encounter (Signed)
Patient notified per [place] that form has been faxed.

## 2019-11-07 ENCOUNTER — Encounter (HOSPITAL_COMMUNITY): Payer: Self-pay

## 2019-11-07 ENCOUNTER — Other Ambulatory Visit: Payer: Self-pay

## 2019-11-07 ENCOUNTER — Encounter (HOSPITAL_COMMUNITY)
Admission: RE | Admit: 2019-11-07 | Discharge: 2019-11-07 | Disposition: A | Discharge status: Home / Self Care | Payer: Medicare Other | Source: Ambulatory Visit | Attending: Orthopedic Surgery | Admitting: Orthopedic Surgery

## 2019-11-07 DIAGNOSIS — Z01812 Encounter for preprocedural laboratory examination: Secondary | ICD-10-CM | POA: Insufficient documentation

## 2019-11-07 LAB — BASIC METABOLIC PANEL
Anion gap: 7 (ref 5–15)
BUN: 24 mg/dL — ABNORMAL HIGH (ref 8–23)
CO2: 24 mmol/L (ref 22–32)
Calcium: 9.2 mg/dL (ref 8.9–10.3)
Chloride: 107 mmol/L (ref 98–111)
Creatinine, Ser: 1.36 mg/dL — ABNORMAL HIGH (ref 0.61–1.24)
GFR calc Af Amer: 56 mL/min — ABNORMAL LOW (ref >60)
GFR calc non Af Amer: 48 mL/min — ABNORMAL LOW (ref >60)
Glucose, Bld: 112 mg/dL — ABNORMAL HIGH (ref 70–99)
Potassium: 4.7 mmol/L (ref 3.5–5.1)
Sodium: 138 mmol/L (ref 135–145)

## 2019-11-07 LAB — CBC
HCT: 38.6 % — ABNORMAL LOW (ref 39.0–52.0)
Hemoglobin: 12.6 g/dL — ABNORMAL LOW (ref 13.0–17.0)
MCH: 31.4 pg (ref 26.0–34.0)
MCHC: 32.6 g/dL (ref 30.0–36.0)
MCV: 96.3 fL (ref 80.0–100.0)
Platelets: 251 10*3/uL (ref 150–400)
RBC: 4.01 MIL/uL — ABNORMAL LOW (ref 4.22–5.81)
RDW: 13.1 % (ref 11.5–15.5)
WBC: 6 10*3/uL (ref 4.0–10.5)
nRBC: 0 % (ref 0.0–0.2)

## 2019-11-07 LAB — SURGICAL PCR SCREEN
MRSA, PCR: NEGATIVE
Staphylococcus aureus: NEGATIVE

## 2019-11-07 LAB — ABO/RH: ABO/RH(D): A POS

## 2019-11-07 NOTE — Progress Notes (Addendum)
PCP - DR. Penni Homans. Cardiologist -   Chest x-ray -  EKG -  Stress Test -  ECHO -  Cardiac Cath -   Sleep Study -  CPAP -   Fasting Blood Sugar - 100'S Checks Blood Sugar 1 times a day  Blood Thinner Instructions: Aspirin Instructions: Last Dose:  Anesthesia review:   Patient denies shortness of breath, fever, cough and chest pain at PAT appointment   Patient verbalized understanding of instructions that were given to them at the PAT appointment. Patient was also instructed that they will need to review over the PAT instructions again at home before surgery.

## 2019-11-07 NOTE — Patient Instructions (Addendum)
DUE TO COVID-19 ONLY ONE VISITOR IS ALLOWED TO COME WITH YOU AND STAY IN THE WAITING ROOM ONLY DURING PRE OP AND PROCEDURE DAY OF SURGERY. THE 1 VISITOR MAY VISIT WITH YOU AFTER SURGERY IN YOUR PRIVATE ROOM DURING VISITING HOURS ONLY!  YOU NEED TO HAVE A COVID 19 TEST ON_______ @_______ , THIS TEST MUST BE DONE BEFORE SURGERY, COME  [name], [name] , [id number].  ([place]) ONCE YOUR COVID TEST IS COMPLETED, PLEASE BEGIN THE QUARANTINE INSTRUCTIONS AS OUTLINED IN YOUR HANDOUT.                [name]  11/07/2019   Your procedure is scheduled on:    Report to [facility] Main  Entrance   Report to admitting at AM     Call this number if you have problems the morning of surgery [phone]    Remember: Do not eat food or drink liquids :After Midnight. BRUSH YOUR TEETH MORNING OF SURGERY AND RINSE YOUR MOUTH OUT, NO CHEWING GUM CANDY OR MINTS.     Take these medicines the morning of surgery with A SIP OF WATER:   DO NOT TAKE ANY DIABETIC MEDICATIONS DAY OF YOUR SURGERY                               You may not have any metal on your body including hair pins and              piercings  Do not wear jewelry, make-up, lotions, powders or perfumes, deodorant             Do not wear nail polish on your fingernails.  Do not shave  48 hours prior to surgery.              Men may shave face and neck.   Do not bring valuables to the hospital. [place].  Contacts, dentures or bridgework may not be worn into surgery.  Leave suitcase in the car. After surgery it may be brought to your room.     Patients discharged the day of surgery will not be allowed to drive home. IF YOU ARE HAVING SURGERY AND GOING HOME THE SAME DAY, YOU MUST HAVE AN ADULT TO DRIVE YOU HOME AND BE WITH YOU FOR 24 HOURS. YOU MAY GO HOME BY TAXI OR UBER OR ORTHERWISE, BUT AN ADULT MUST ACCOMPANY YOU HOME AND STAY WITH YOU FOR 24  HOURS.  Name and phone number of your driver:  Special Instructions: N/A              Please read over the following fact sheets you were given: _____________________________________________________________________             DUE TO COVID-19 ONLY ONE VISITOR IS ALLOWED TO COME WITH YOU AND STAY IN THE WAITING ROOM ONLY DURING PRE OP AND PROCEDURE DAY OF SURGERY. THE 1 VISITOR MAY VISIT WITH YOU AFTER SURGERY IN YOUR PRIVATE ROOM DURING VISITING HOURS ONLY!  YOU NEED TO HAVE A COVID 19 TEST ON_______ @_______ , THIS TEST MUST BE DONE BEFORE SURGERY, COME  [name], [name] , [id number].  ([name]) ONCE YOUR COVID TEST IS COMPLETED, PLEASE BEGIN THE QUARANTINE INSTRUCTIONS AS OUTLINED IN YOUR HANDOUT.  [name]  11/07/2019   Your procedure is scheduled on:    Report to [facility] Main  Entrance   Report to admitting at AM     Call this number if you have problems the morning of surgery [phone]    Remember: Do not eat food or drink liquids :After Midnight. BRUSH YOUR TEETH MORNING OF SURGERY AND RINSE YOUR MOUTH OUT, NO CHEWING GUM CANDY OR MINTS.     Take these medicines the morning of surgery with A SIP OF WATER:   DO NOT TAKE ANY DIABETIC MEDICATIONS DAY OF YOUR SURGERY                               You may not have any metal on your body including hair pins and              piercings  Do not wear jewelry, make-up, lotions, powders or perfumes, deodorant             Do not wear nail polish on your fingernails.  Do not shave  48 hours prior to surgery.              Men may shave face and neck.   Do not bring valuables to the hospital. [place].  Contacts, dentures or bridgework may not be worn into surgery.  Leave suitcase in the car. After surgery it may be brought to your room.     Patients discharged the day of surgery will not be allowed to drive home. IF  YOU ARE HAVING SURGERY AND GOING HOME THE SAME DAY, YOU MUST HAVE AN ADULT TO DRIVE YOU HOME AND BE WITH YOU FOR 24 HOURS. YOU MAY GO HOME BY TAXI OR UBER OR ORTHERWISE, BUT AN ADULT MUST ACCOMPANY YOU HOME AND STAY WITH YOU FOR 24 HOURS.  Name and phone number of your driver:  Special Instructions: N/A              Please read over the following fact sheets you were given: _____________________________________________________________________  DUE TO COVID-19 ONLY ONE VISITOR IS ALLOWED TO COME WITH YOU AND STAY IN THE WAITING ROOM ONLY DURING PRE OP AND PROCEDURE DAY OF SURGERY. THE 1 VISITOR MAY VISIT WITH YOU AFTER SURGERY IN YOUR PRIVATE ROOM DURING VISITING HOURS ONLY!  YOU NEED TO HAVE A COVID 19 TEST ON: 11/11/19 @ 9:20 am, THIS TEST MUST BE DONE BEFORE SURGERY, COME  [name], [name] , [id number].  ([name]) ONCE YOUR COVID TEST IS COMPLETED,  PLEASE BEGIN THE QUARANTINE INSTRUCTIONS AS OUTLINED IN YOUR HANDOUT.                [name]   Your procedure is scheduled on: 11/15/19   Report to [facility] Main  Entrance   Report to admitting at: 6:30 AM     Call this number if you have problems the morning of surgery [phone]    Remember:   NO SOLID FOOD AFTER MIDNIGHT THE NIGHT PRIOR TO SURGERY. NOTHING BY MOUTH EXCEPT CLEAR LIQUIDS UNTIL: 5:30 am . PLEASE FINISH GATORADE DRINK PER SURGEON ORDER  WHICH NEEDS TO BE COMPLETED AT: 5:30 am .   CLEAR LIQUID DIET   Foods Allowed                                                                     Foods Excluded  Coffee and tea, regular and decaf                             liquids that you cannot  Plain Jell-O any favor except red or purple                                           see through such as: Fruit ices (not with fruit pulp)                                     milk, soups, orange juice  Iced Popsicles                                    All solid food Carbonated beverages, regular and diet                                    Cranberry, grape and apple juices Sports drinks like Gatorade Lightly seasoned clear broth or consume(fat free) Sugar, honey syrup  Sample Menu Breakfast                                Lunch                                     Supper Cranberry juice  Beef broth                            Chicken broth Jell-O                                     Grape juice                           Apple juice Coffee or tea                        Jell-O                                      Popsicle                                                Coffee or tea                        Coffee or tea  _____________________________________________________________________  How to Manage Your Diabetes Before and After Surgery  Why is it important to control my blood sugar before and after surgery? . Improving blood sugar levels  before and after surgery helps healing and can limit problems. . A way of improving blood sugar control is eating a healthy diet by: o  Eating less sugar and carbohydrates o  Increasing activity/exercise o  Talking with your doctor about reaching your blood sugar goals . High blood sugars (greater than 180 mg/dL) can raise your risk of infections and slow your recovery, so you will need to focus on controlling your diabetes during the weeks before surgery. . Make sure that the doctor who takes care of your diabetes knows about your planned surgery including the date and location.  How do I manage my blood sugar before surgery? . Check your blood sugar at least 4 times a day, starting 2 days before surgery, to make sure that the level is not too high or low. o Check your blood sugar the morning of your surgery when you wake up and every 2 hours until you get to the Short Stay unit. . If your blood sugar is less than 70 mg/dL, you will need to treat for low blood sugar: o Do not take insulin. o Treat a low blood sugar (less than 70 mg/dL) with  cup of clear juice (cranberry or apple), 4 glucose tablets, OR glucose gel. o Recheck blood sugar in 15 minutes after treatment (to make sure it is greater than 70 mg/dL). If your blood sugar is not greater than 70 mg/dL on recheck, call [phone] for further instructions. . Report your blood sugar to the short stay nurse when you get to Short Stay.  . If you are admitted to the hospital after surgery: o Your blood sugar will be checked by the staff and you will probably be given insulin after surgery (instead of oral diabetes medicines) to make sure you have good blood sugar levels. o The goal for blood sugar control after surgery is 80-180 mg/dL.   WHAT DO I DO ABOUT  MY DIABETES MEDICATION?  Marland Kitchen Do not take oral diabetes medicines (pills) the morning of surgery.  . THE DAY BEFORE SURGERY, take ONLY Morning dose of GLYBURIDE, DO NOT TAKE EVENING  DOSE. TAKE JANUVIA AND METFORMIN AS USUAL.       . THE MORNING OF SURGERY: DO NOT TAKE ANY DIABETIC MEDICATIONS DAY OF YOUR SURGERY   BRUSH YOUR TEETH MORNING OF SURGERY AND RINSE YOUR MOUTH OUT, NO CHEWING GUM CANDY OR M            You may not have any metal on your body.           Do not wear jewelry, lotions, powders or perfumes, deodorant           Men may shave face and neck.   Do not bring valuables to the hospital. [place].  Contacts, dentures or bridgework may not be worn into surgery.  Leave suitcase in the car. After surgery it may be brought to your room.     Patients discharged the day of surgery will not be allowed to drive home. IF YOU ARE HAVING SURGERY AND GOING HOME THE SAME DAY, YOU MUST HAVE AN ADULT TO DRIVE YOU HOME AND BE WITH YOU FOR 24 HOURS. YOU MAY GO HOME BY TAXI OR UBER OR ORTHERWISE, BUT AN ADULT MUST ACCOMPANY YOU HOME AND STAY WITH YOU FOR 24 HOURS.  Name and phone number of your driver:  Special Instructions: N/A              Please read over the following fact sheets you were given: _____________________________________________________________________  [facility] - Preparing for Surgery Before surgery, you can play an important role.  Because skin is not sterile, your skin needs to be as free of germs as possible.  You can reduce the number of germs on your skin by washing with CHG (chlorahexidine gluconate) soap before surgery.  CHG is an antiseptic cleaner which kills germs and bonds with the skin to continue killing germs even after washing. Please DO NOT use if you have an allergy to CHG or antibacterial soaps.  If your skin becomes reddened/irritated stop using the CHG and inform your nurse when you arrive at Short Stay. Do not shave (including legs and underarms) for at least 48 hours prior to the first CHG shower.  You may shave your face/neck. Please follow these instructions carefully:  1.   Shower with CHG Soap the night before surgery and the  morning of Surgery.  2.  If you choose to wash your hair, wash your hair first as usual with your  normal  shampoo.  3.  After you shampoo, rinse your hair and body thoroughly to remove the  shampoo.                           4.  Use CHG as you would any other liquid soap.  You can apply chg directly  to the skin and wash                       Gently with a scrungie or clean washcloth.  5.  Apply the CHG Soap to your body ONLY FROM THE NECK DOWN.   Do not use on face/ open  Wound or open sores. Avoid contact with eyes, ears mouth and genitals (private parts).                       Wash face,  Genitals (private parts) with your normal soap.             6.  Wash thoroughly, paying special attention to the area where your surgery  will be performed.  7.  Thoroughly rinse your body with warm water from the neck down.  8.  DO NOT shower/wash with your normal soap after using and rinsing off  the CHG Soap.                9.  Pat yourself dry with a clean towel.            10.  Wear clean pajamas.            11.  Place clean sheets on your bed the night of your first shower and do not  sleep with pets. Day of Surgery : Do not apply any lotions/deodorants the morning of surgery.  Please wear clean clothes to the hospital/surgery center.  FAILURE TO FOLLOW THESE INSTRUCTIONS MAY RESULT IN THE CANCELLATION OF YOUR SURGERY PATIENT SIGNATURE_________________________________  NURSE SIGNATURE__________________________________  ________________________________________________________________________   [name]  An incentive spirometer is a tool that can help keep your lungs clear and active. This tool measures how well you are filling your lungs with each breath. Taking long deep breaths may help reverse or decrease the chance of developing breathing (pulmonary) problems (especially infection) following:  A long  period of time when you are unable to move or be active. BEFORE THE PROCEDURE   If the spirometer includes an indicator to show your best effort, your nurse or respiratory therapist will set it to a desired goal.  If possible, sit up straight or lean slightly forward. Try not to slouch.  Hold the incentive spirometer in an upright position. INSTRUCTIONS FOR USE  1. Sit on the edge of your bed if possible, or sit up as far as you can in bed or on a chair. 2. Hold the incentive spirometer in an upright position. 3. Breathe out normally. 4. Place the mouthpiece in your mouth and seal your lips tightly around it. 5. Breathe in slowly and as deeply as possible, raising the piston or the ball toward the top of the column. 6. Hold your breath for 3-5 seconds or for as long as possible. Allow the piston or ball to fall to the bottom of the column. 7. Remove the mouthpiece from your mouth and breathe out normally. 8. Rest for a few seconds and repeat Steps 1 through 7 at least 10 times every 1-2 hours when you are awake. Take your time and take a few normal breaths between deep breaths. 9. The spirometer may include an indicator to show your best effort. Use the indicator as a goal to work toward during each repetition. 10. After each set of 10 deep breaths, practice coughing to be sure your lungs are clear. If you have an incision (the cut made at the time of surgery), support your incision when coughing by placing a pillow or rolled up towels firmly against it. Once you are able to get out of bed, walk around indoors and cough well. You may stop using the incentive spirometer when instructed by your caregiver.  RISKS AND COMPLICATIONS  Take your time so you do not get  dizzy or light-headed.  If you are in pain, you may need to take or ask for pain medication before doing incentive spirometry. It is harder to take a deep breath if you are having pain. AFTER USE  Rest and breathe slowly and  easily.  It can be helpful to keep track of a log of your progress. Your caregiver can provide you with a simple table to help with this. If you are using the spirometer at home, follow these instructions: [place] IF:   You are having difficultly using the spirometer.  You have trouble using the spirometer as often as instructed.  Your pain medication is not giving enough relief while using the spirometer.  You develop fever of 100.5 F (38.1 C) or higher. SEEK IMMEDIATE MEDICAL CARE IF:   You cough up bloody sputum that had not been present before.  You develop fever of 102 F (38.9 C) or greater.  You develop worsening pain at or near the incision site. MAKE SURE YOU:   Understand these instructions.  Will watch your condition.  Will get help right away if you are not doing well or get worse. Document Released: 11/03/2006 Document Revised: 09/15/2011 Document Reviewed: 01/04/2007 [facility] Patient Information 2014 [place], [place].   ________________________________________________________________________

## 2019-11-09 NOTE — H&P (Signed)
TOTAL HIP ADMISSION H&P  Patient is admitted for left total hip arthroplasty, anterior approach.  Subjective:  Chief Complaint:    Left hip primary OA / pain  HPI: [name], 85 y.o. male, has a history of pain and functional disability in the left hip(s) due to arthritis and patient has failed non-surgical conservative treatments for greater than 12 weeks to include NSAID's and/or analgesics, corticosteriod injections and activity modification.  Onset of symptoms was gradual starting 2+ years ago with gradually worsening course since that time.The patient noted no past surgery on the left hip(s).  Patient currently rates pain in the left hip at 7 out of 10 with activity. Patient has night pain, worsening of pain with activity and weight bearing, trendelenberg gait, pain that interfers with activities of daily living and pain with passive range of motion. Patient has evidence of periarticular osteophytes and joint space narrowing by imaging studies. This condition presents safety issues increasing the risk of falls.  There is no current active infection.  Risks, benefits and expectations were discussed with the patient.  Risks including but not limited to the risk of anesthesia, blood clots, nerve damage, blood vessel damage, failure of the prosthesis, infection and up to and including death.  Patient understand the risks, benefits and expectations and wishes to proceed with surgery.   PCP: Mosie Lukes, MD  D/C Plans:       Home   Post-op Meds:       No Rx given   Tranexamic Acid:      To be given - IV   Decadron:      Is to be given  FYI:      ASA  [place]  DME:   Pt already has equipment   PT:   [facility]  Pharmacy: [facility] - [id number] [address], [place], [place]    Patient Active Problem List   Diagnosis Date Noted  . Urinary frequency 06/20/2019  . Malignant neoplasm of prostate ([place]) 03/15/2019  . Medicare annual wellness visit, subsequent 12/31/2015  . Onychomycosis  09/17/2015  . History of colonic polyps 12/04/2014  . Hearing loss 11/23/2013  . Sinusitis 09/12/2013  . BPH (benign prostatic hyperplasia) 08/07/2013  . Vasomotor rhinitis 05/02/2013  . Left hip pain 03/14/2012  . Back pain 03/14/2012  . Pain in finger of left hand 08/18/2011  . Diabetes mellitus type 2 in nonobese ([place]) 06/19/2009  . Hyperlipidemia 06/19/2009  . Glaucoma (increased eye pressure) 06/19/2009   Past Medical History:  Diagnosis Date  . Allergy    mild  . Arthritis    fingers, hip  . BPH (benign prostatic hypertrophy)   . Cataract    removed both eyes  . Diabetes mellitus 2000   type 2  . Diabetes mellitus type 2 in nonobese [facility]) 06/19/2009   Qualifier: Diagnosis of  By: Wynona Luna    . GERD (gastroesophageal reflux disease)   . Glaucoma    followed by Dr Lanell Matar at [facility]  . Hyperlipidemia   . Left hip pain 03/14/2012  . Malignant neoplasm of prostate ([place]) 03/15/2019  . Medicare annual wellness visit, subsequent 12/31/2015  . Onychomycosis 09/17/2015  . Prostate cancer ([place])   . Tubular adenoma of colon 2003  . Vasomotor rhinitis 05/02/2013    Past Surgical History:  Procedure Laterality Date  . APPENDECTOMY  1943  . CATARACT EXTRACTION, BILATERAL    . COLONOSCOPY    . CYSTOSCOPY WITH INSERTION OF UROLIFT    .  EYE SURGERY     laser surgery on left numerous times.   Marland Kitchen POLYPECTOMY    . TRABECULECTOMY Left 09/04/2016    No current facility-administered medications for this encounter.   Current Outpatient Medications  Medication Sig Dispense Refill Last Dose  . brimonidine-timolol (COMBIGAN) 0.2-0.5 % ophthalmic solution Place 1 drop into the right eye 2 (two) times daily.      . brinzolamide (AZOPT) 1 % ophthalmic suspension Place 1 drop into the right eye 3 (three) times daily.     . cholecalciferol (VITAMIN D3) 25 MCG (1000 UNIT) tablet Take 1,000 Units by mouth daily.     . famotidine (PEPCID) 20 MG tablet TAKE 1 TABLET DAILY AS  NEEDED FOR HEARTBURN OR INDIGESTION (Patient taking differently: Take 20 mg by mouth daily. ) 90 tablet 3   . glyBURIDE (DIABETA) 2.5 MG tablet Take 1 tablet (2.5 mg total) by mouth 2 (two) times daily with a meal. 180 tablet 1   . Ibuprofen 200 MG CAPS Take 400 mg by mouth in the morning and at bedtime.      Marland Kitchen ipratropium (ATROVENT) 0.03 % nasal spray Place 2 sprays into both nostrils 2 (two) times daily as needed for rhinitis. 90 mL 1   . JANUVIA 100 MG tablet TAKE 1 TABLET DAILY (Patient taking differently: Take 100 mg by mouth daily. ) 90 tablet 3   . metFORMIN (GLUCOPHAGE) 1000 MG tablet TAKE 1 TABLET TWICE A DAY AND ONE-HALF (1/2) TABLET EVERY NOON (Patient taking differently: Take 500-1,000 mg by mouth See admin instructions. Take 1000 mg by mouth in the morning, 500 mg in the afternoon and 1000 mg in the evening) 225 tablet 3   . Multiple Vitamin (MULTIVITAMIN WITH MINERALS) TABS tablet Take 1 tablet by mouth daily.     . simvastatin (ZOCOR) 10 MG tablet TAKE 1 TABLET AT BEDTIME (Patient taking differently: Take 10 mg by mouth at bedtime. ) 90 tablet 3   . travoprost, benzalkonium, (TRAVATAN) 0.004 % ophthalmic solution Place 1 drop into the right eye at bedtime.      . valACYclovir (VALTREX) 1000 MG tablet TAKE 1 TABLET DAILY (Patient taking differently: Take 500 mg by mouth daily. ) 90 tablet 3   . silodosin (RAPAFLO) 8 MG CAPS capsule Take 1 capsule (8 mg total) by mouth daily after supper. (Patient not taking: Reported on 10/25/2019) 30 capsule 5 Not Taking at Unknown time  . TRUE METRIX BLOOD GLUCOSE TEST test strip TEST EVERY DAY 100 strip 99   . vardenafil (LEVITRA) 20 MG tablet Take 1 tablet (20 mg total) by mouth daily as needed. (Patient not taking: Reported on 10/25/2019) 30 tablet 2 Not Taking at Unknown time  . [employer] LANCETS MISC Use as directed once daily to check blood sugar.  DX E11.9 100 each 6    Allergies  Allergen Reactions  . Levaquin [Levofloxacin In D5w] Hives     Social History   Tobacco Use  . Smoking status: Never Smoker  . Smokeless tobacco: Never Used  Substance Use Topics  . Alcohol use: Yes    Alcohol/week: 0.0 standard drinks    Comment: once monthly "maybe"    Family History  Problem Relation Age of Onset  . Obstructive Sleep Apnea Sister   . Heart disease Father        MI  . Diabetes Father   . Arthritis Brother        knees  . Heart disease Brother  a fib  . Breast cancer Sister   . Colon cancer Paternal Uncle 72  . Colon polyps Neg Hx   . Esophageal cancer Neg Hx   . Rectal cancer Neg Hx   . Stomach cancer Neg Hx   . Prostate cancer Neg Hx      Review of Systems  Constitutional: Negative.   HENT: Negative.   Eyes: Negative.   Respiratory: Negative.   Cardiovascular: Negative.   Gastrointestinal: Negative.   Musculoskeletal: Positive for joint pain.  Skin: Negative.   Neurological: Negative.   Psychiatric/Behavioral: Negative.       Objective:  Physical Exam  Constitutional: He is oriented to person, place, and time. He appears well-developed.  HENT:  Head: Normocephalic.  Eyes: Pupils are equal, round, and reactive to light.  Neck: No JVD present. No tracheal deviation present. No thyromegaly present.  Cardiovascular: Normal rate, regular rhythm and intact distal pulses.  Respiratory: Effort normal and breath sounds normal. No respiratory distress. He has no wheezes.  GI: Soft. There is no abdominal tenderness. There is no guarding.  Musculoskeletal:     Cervical back: Neck supple.     Left hip: Tenderness and bony tenderness present. No swelling, lacerations or crepitus. Decreased range of motion. Decreased strength.  Lymphadenopathy:    He has no cervical adenopathy.  Neurological: He is alert and oriented to person, place, and time.  Skin: Skin is warm and dry.  Psychiatric: He has a normal mood and affect.     Labs:  Estimated body mass index is 22.74 kg/m as calculated from the  following:   Height as of 11/07/19: 6' 0.5" (1.842 m).   Weight as of 11/07/19: 77.1 kg.   Imaging Review Plain radiographs demonstrate severe degenerative joint disease of the left hip(s). The bone quality appears to be good for age and reported activity level.      Assessment/Plan:  End stage arthritis, left hip(s)  The patient history, physical examination, clinical judgement of the provider and imaging studies are consistent with end stage degenerative joint disease of the left hip(s) and total hip arthroplasty is deemed medically necessary. The treatment options including medical management, injection therapy, arthroscopy and arthroplasty were discussed at length. The risks and benefits of total hip arthroplasty were presented and reviewed. The risks due to aseptic loosening, infection, stiffness, dislocation/subluxation,  thromboembolic complications and other imponderables were discussed.  The patient acknowledged the explanation, agreed to proceed with the plan and consent was signed. Patient is being admitted for inpatient treatment for surgery, pain control, PT, OT, prophylactic antibiotics, VTE prophylaxis, progressive ambulation and ADL's and discharge planning.The patient is planning to be discharged home.   West Pugh Tramaine Sauls   PA-C  11/09/2019, 9:35 PM

## 2019-11-11 ENCOUNTER — Other Ambulatory Visit (HOSPITAL_COMMUNITY)
Admission: RE | Admit: 2019-11-11 | Discharge: 2019-11-11 | Disposition: A | Discharge status: Home / Self Care | Payer: Medicare Other | Source: Ambulatory Visit | Attending: Orthopedic Surgery | Admitting: Orthopedic Surgery

## 2019-11-11 DIAGNOSIS — Z20822 Contact with and (suspected) exposure to covid-19: Secondary | ICD-10-CM | POA: Diagnosis not present

## 2019-11-11 DIAGNOSIS — Z01812 Encounter for preprocedural laboratory examination: Secondary | ICD-10-CM | POA: Diagnosis present

## 2019-11-11 LAB — SARS CORONAVIRUS 2 (TAT 6-24 HRS): SARS Coronavirus 2: NEGATIVE

## 2019-11-13 ENCOUNTER — Other Ambulatory Visit: Payer: Self-pay | Admitting: Family Medicine

## 2019-11-14 ENCOUNTER — Other Ambulatory Visit: Payer: Self-pay | Admitting: Family Medicine

## 2019-11-15 ENCOUNTER — Encounter (HOSPITAL_COMMUNITY)
Admission: RE | Disposition: A | Discharge status: Home / Self Care | Payer: Self-pay | Source: Ambulatory Visit | Attending: Orthopedic Surgery

## 2019-11-15 ENCOUNTER — Ambulatory Visit (HOSPITAL_COMMUNITY): Payer: Medicare Other

## 2019-11-15 ENCOUNTER — Ambulatory Visit (HOSPITAL_COMMUNITY): Payer: Medicare Other | Admitting: Anesthesiology

## 2019-11-15 ENCOUNTER — Observation Stay (HOSPITAL_COMMUNITY): Payer: Medicare Other

## 2019-11-15 ENCOUNTER — Encounter (HOSPITAL_COMMUNITY): Payer: Self-pay | Admitting: Orthopedic Surgery

## 2019-11-15 ENCOUNTER — Other Ambulatory Visit: Payer: Self-pay

## 2019-11-15 ENCOUNTER — Observation Stay (HOSPITAL_COMMUNITY)
Admission: RE | Admit: 2019-11-15 | Discharge: 2019-11-16 | Disposition: A | Discharge status: Home / Self Care | Payer: Medicare Other | Source: Ambulatory Visit | Attending: Orthopedic Surgery | Admitting: Orthopedic Surgery

## 2019-11-15 DIAGNOSIS — N400 Enlarged prostate without lower urinary tract symptoms: Secondary | ICD-10-CM | POA: Insufficient documentation

## 2019-11-15 DIAGNOSIS — Z79899 Other long term (current) drug therapy: Secondary | ICD-10-CM | POA: Diagnosis not present

## 2019-11-15 DIAGNOSIS — E119 Type 2 diabetes mellitus without complications: Secondary | ICD-10-CM | POA: Insufficient documentation

## 2019-11-15 DIAGNOSIS — K219 Gastro-esophageal reflux disease without esophagitis: Secondary | ICD-10-CM | POA: Insufficient documentation

## 2019-11-15 DIAGNOSIS — Z96642 Presence of left artificial hip joint: Secondary | ICD-10-CM | POA: Diagnosis present

## 2019-11-15 DIAGNOSIS — Z8546 Personal history of malignant neoplasm of prostate: Secondary | ICD-10-CM | POA: Insufficient documentation

## 2019-11-15 DIAGNOSIS — Z7984 Long term (current) use of oral hypoglycemic drugs: Secondary | ICD-10-CM | POA: Diagnosis not present

## 2019-11-15 DIAGNOSIS — Z96649 Presence of unspecified artificial hip joint: Secondary | ICD-10-CM

## 2019-11-15 DIAGNOSIS — M1612 Unilateral primary osteoarthritis, left hip: Principal | ICD-10-CM | POA: Insufficient documentation

## 2019-11-15 DIAGNOSIS — E785 Hyperlipidemia, unspecified: Secondary | ICD-10-CM | POA: Diagnosis not present

## 2019-11-15 DIAGNOSIS — Z419 Encounter for procedure for purposes other than remedying health state, unspecified: Secondary | ICD-10-CM

## 2019-11-15 HISTORY — DX: Other complications of anesthesia, initial encounter: T88.59XA

## 2019-11-15 HISTORY — PX: TOTAL HIP ARTHROPLASTY: SHX124

## 2019-11-15 LAB — TYPE AND SCREEN
ABO/RH(D): A POS
Antibody Screen: NEGATIVE

## 2019-11-15 LAB — GLUCOSE, CAPILLARY
Glucose-Capillary: 153 mg/dL — ABNORMAL HIGH (ref 70–99)
Glucose-Capillary: 142 mg/dL — ABNORMAL HIGH (ref 70–99)
Glucose-Capillary: 186 mg/dL — ABNORMAL HIGH (ref 70–99)
Glucose-Capillary: 220 mg/dL — ABNORMAL HIGH (ref 70–99)

## 2019-11-15 SURGERY — ARTHROPLASTY, HIP, TOTAL, ANTERIOR APPROACH
Anesthesia: Spinal | Site: Hip | Laterality: Left

## 2019-11-15 MED ORDER — TRAVOPROST 0.004 % OP SOLN: 1.0000 [drp] | Freq: Every day | OPHTHALMIC | Status: DC

## 2019-11-15 MED ORDER — GLYCOPYRROLATE PF 0.2 MG/ML IJ SOSY
PREFILLED_SYRINGE | INTRAMUSCULAR | Status: AC
  Filled 2019-11-15: qty 1

## 2019-11-15 MED ORDER — METHOCARBAMOL 500 MG IVPB - SIMPLE MED
500.0000 mg | Freq: Four times a day (QID) | INTRAVENOUS | Status: DC | PRN
  Filled 2019-11-15: qty 50

## 2019-11-15 MED ORDER — HYDROCODONE-ACETAMINOPHEN 7.5-325 MG PO TABS: 1.0000 | ORAL_TABLET | ORAL | Status: DC | PRN

## 2019-11-15 MED ORDER — TIMOLOL MALEATE 0.5 % OP SOLN
1.0000 [drp] | Freq: Two times a day (BID) | OPHTHALMIC | Status: DC
  Administered 2019-11-15 – 2019-11-16 (×2): 1 [drp] via OPHTHALMIC
  Filled 2019-11-15: qty 5

## 2019-11-15 MED ORDER — MIDAZOLAM HCL 2 MG/2ML IJ SOLN
INTRAMUSCULAR | Status: AC
  Filled 2019-11-15: qty 2

## 2019-11-15 MED ORDER — BUPIVACAINE HCL 0.25 % IJ SOLN
INTRAMUSCULAR | Status: AC
  Filled 2019-11-15: qty 1

## 2019-11-15 MED ORDER — CELECOXIB 200 MG PO CAPS
200.0000 mg | ORAL_CAPSULE | Freq: Two times a day (BID) | ORAL | Status: DC
  Administered 2019-11-15 – 2019-11-16 (×2): 200 mg via ORAL
  Filled 2019-11-15 (×2): qty 1

## 2019-11-15 MED ORDER — INSULIN ASPART 100 UNIT/ML ~~LOC~~ SOLN
0.0000 [IU] | Freq: Every day | SUBCUTANEOUS | Status: DC
  Administered 2019-11-15: 2 [IU] via SUBCUTANEOUS

## 2019-11-15 MED ORDER — PROPOFOL 500 MG/50ML IV EMUL
INTRAVENOUS | Status: DC | PRN
  Administered 2019-11-15: 75 ug/kg/min via INTRAVENOUS

## 2019-11-15 MED ORDER — ACETAMINOPHEN 325 MG PO TABS: 325.0000 mg | ORAL_TABLET | Freq: Four times a day (QID) | ORAL | Status: DC | PRN

## 2019-11-15 MED ORDER — CEFAZOLIN SODIUM-DEXTROSE 2-4 GM/100ML-% IV SOLN
2.0000 g | Freq: Four times a day (QID) | INTRAVENOUS | Status: AC
  Administered 2019-11-15 – 2019-11-16 (×2): 2 g via INTRAVENOUS
  Filled 2019-11-15 (×2): qty 100

## 2019-11-15 MED ORDER — FENTANYL CITRATE (PF) 100 MCG/2ML IJ SOLN
INTRAMUSCULAR | Status: DC | PRN
  Administered 2019-11-15: 50 ug via INTRAVENOUS

## 2019-11-15 MED ORDER — BRINZOLAMIDE 1 % OP SUSP
1.0000 [drp] | Freq: Three times a day (TID) | OPHTHALMIC | Status: DC
  Filled 2019-11-15: qty 10

## 2019-11-15 MED ORDER — METOCLOPRAMIDE HCL 5 MG/ML IJ SOLN: 5.0000 mg | Freq: Three times a day (TID) | INTRAMUSCULAR | Status: DC | PRN

## 2019-11-15 MED ORDER — VALACYCLOVIR HCL 500 MG PO TABS
500.0000 mg | ORAL_TABLET | Freq: Every day | ORAL | Status: DC
  Administered 2019-11-15 – 2019-11-16 (×2): 500 mg via ORAL
  Filled 2019-11-15 (×2): qty 1

## 2019-11-15 MED ORDER — BISACODYL 10 MG RE SUPP: 10.0000 mg | Freq: Every day | RECTAL | Status: DC | PRN

## 2019-11-15 MED ORDER — FENTANYL CITRATE (PF) 100 MCG/2ML IJ SOLN
INTRAMUSCULAR | Status: AC
  Filled 2019-11-15: qty 2

## 2019-11-15 MED ORDER — PROPOFOL 10 MG/ML IV BOLUS
INTRAVENOUS | Status: AC
  Filled 2019-11-15: qty 20

## 2019-11-15 MED ORDER — SODIUM CHLORIDE (PF) 0.9 % IJ SOLN
INTRAMUSCULAR | Status: AC
  Filled 2019-11-15: qty 50

## 2019-11-15 MED ORDER — SODIUM CHLORIDE 0.9 % IR SOLN
Status: DC | PRN
  Administered 2019-11-15: 1000 mL

## 2019-11-15 MED ORDER — PHENYLEPHRINE HCL-NACL 10-0.9 MG/250ML-% IV SOLN
INTRAVENOUS | Status: DC | PRN
Start: 2019-11-15 — End: 2019-11-15
  Administered 2019-11-15: 25 ug/min via INTRAVENOUS

## 2019-11-15 MED ORDER — HYDROMORPHONE HCL 1 MG/ML IJ SOLN: 0.5000 mg | INTRAMUSCULAR | Status: DC | PRN

## 2019-11-15 MED ORDER — SIMVASTATIN 10 MG PO TABS
10.0000 mg | ORAL_TABLET | Freq: Every day | ORAL | Status: DC
  Administered 2019-11-15: 10 mg via ORAL
  Filled 2019-11-15: qty 1

## 2019-11-15 MED ORDER — IPRATROPIUM BROMIDE 0.03 % NA SOLN
2.0000 | Freq: Two times a day (BID) | NASAL | Status: DC | PRN
  Filled 2019-11-15: qty 30

## 2019-11-15 MED ORDER — METHOCARBAMOL 500 MG PO TABS
500.0000 mg | ORAL_TABLET | Freq: Four times a day (QID) | ORAL | Status: DC | PRN
  Filled 2019-11-15: qty 1

## 2019-11-15 MED ORDER — BRIMONIDINE TARTRATE-TIMOLOL 0.2-0.5 % OP SOLN: 1.0000 [drp] | Freq: Two times a day (BID) | OPHTHALMIC | Status: DC

## 2019-11-15 MED ORDER — STERILE WATER FOR IRRIGATION IR SOLN
Status: DC | PRN
  Administered 2019-11-15: 2000 mL

## 2019-11-15 MED ORDER — FAMOTIDINE 20 MG PO TABS
20.0000 mg | ORAL_TABLET | Freq: Every day | ORAL | Status: DC
  Administered 2019-11-15: 20 mg via ORAL
  Filled 2019-11-15 (×2): qty 1

## 2019-11-15 MED ORDER — POLYETHYLENE GLYCOL 3350 17 G PO PACK
17.0000 g | PACK | Freq: Two times a day (BID) | ORAL | Status: DC
  Administered 2019-11-15 – 2019-11-16 (×2): 17 g via ORAL
  Filled 2019-11-15 (×2): qty 1

## 2019-11-15 MED ORDER — ONDANSETRON HCL 4 MG PO TABS: 4.0000 mg | ORAL_TABLET | Freq: Four times a day (QID) | ORAL | Status: DC | PRN

## 2019-11-15 MED ORDER — GLYBURIDE 2.5 MG PO TABS
2.5000 mg | ORAL_TABLET | Freq: Two times a day (BID) | ORAL | Status: DC
  Administered 2019-11-15 – 2019-11-16 (×2): 2.5 mg via ORAL
  Filled 2019-11-15 (×3): qty 1

## 2019-11-15 MED ORDER — ONDANSETRON HCL 4 MG/2ML IJ SOLN: 4.0000 mg | Freq: Four times a day (QID) | INTRAMUSCULAR | Status: DC | PRN

## 2019-11-15 MED ORDER — LIDOCAINE 2% (20 MG/ML) 5 ML SYRINGE
INTRAMUSCULAR | Status: DC | PRN
  Administered 2019-11-15: 60 mg via INTRAVENOUS

## 2019-11-15 MED ORDER — HYDROCODONE-ACETAMINOPHEN 5-325 MG PO TABS
1.0000 | ORAL_TABLET | ORAL | Status: DC | PRN
  Administered 2019-11-15 – 2019-11-16 (×3): 2 via ORAL
  Filled 2019-11-15 (×4): qty 2

## 2019-11-15 MED ORDER — MIDAZOLAM HCL 5 MG/5ML IJ SOLN
INTRAMUSCULAR | Status: DC | PRN
  Administered 2019-11-15: 2 mg via INTRAVENOUS

## 2019-11-15 MED ORDER — INSULIN ASPART 100 UNIT/ML ~~LOC~~ SOLN
0.0000 [IU] | Freq: Three times a day (TID) | SUBCUTANEOUS | Status: DC
  Administered 2019-11-15: 3 [IU] via SUBCUTANEOUS
  Administered 2019-11-16 (×2): 5 [IU] via SUBCUTANEOUS

## 2019-11-15 MED ORDER — LIDOCAINE 2% (20 MG/ML) 5 ML SYRINGE
INTRAMUSCULAR | Status: AC
  Filled 2019-11-15: qty 5

## 2019-11-15 MED ORDER — FENTANYL CITRATE (PF) 100 MCG/2ML IJ SOLN: 25.0000 ug | INTRAMUSCULAR | Status: DC | PRN

## 2019-11-15 MED ORDER — DEXAMETHASONE SODIUM PHOSPHATE 10 MG/ML IJ SOLN: 10.0000 mg | Freq: Once | INTRAMUSCULAR | Status: DC

## 2019-11-15 MED ORDER — TRANEXAMIC ACID-NACL 1000-0.7 MG/100ML-% IV SOLN
1000.0000 mg | Freq: Once | INTRAVENOUS | Status: AC
  Administered 2019-11-15: 1000 mg via INTRAVENOUS
  Filled 2019-11-15: qty 100

## 2019-11-15 MED ORDER — ONDANSETRON HCL 4 MG/2ML IJ SOLN
INTRAMUSCULAR | Status: DC | PRN
  Administered 2019-11-15: 4 mg via INTRAVENOUS

## 2019-11-15 MED ORDER — BRINZOLAMIDE 1 % OP SUSP
1.0000 [drp] | Freq: Three times a day (TID) | OPHTHALMIC | Status: DC
  Administered 2019-11-15 – 2019-11-16 (×3): 1 [drp] via OPHTHALMIC
  Filled 2019-11-15: qty 10

## 2019-11-15 MED ORDER — SODIUM CHLORIDE 0.9 % IV SOLN: INTRAVENOUS | Status: DC

## 2019-11-15 MED ORDER — METOCLOPRAMIDE HCL 5 MG PO TABS: 5.0000 mg | ORAL_TABLET | Freq: Three times a day (TID) | ORAL | Status: DC | PRN

## 2019-11-15 MED ORDER — GLYCOPYRROLATE PF 0.2 MG/ML IJ SOSY
PREFILLED_SYRINGE | INTRAMUSCULAR | Status: DC | PRN
  Administered 2019-11-15: .2 mg via INTRAVENOUS

## 2019-11-15 MED ORDER — DOCUSATE SODIUM 100 MG PO CAPS
100.0000 mg | ORAL_CAPSULE | Freq: Two times a day (BID) | ORAL | Status: DC
  Administered 2019-11-15 – 2019-11-16 (×2): 100 mg via ORAL
  Filled 2019-11-15 (×2): qty 1

## 2019-11-15 MED ORDER — DEXAMETHASONE SODIUM PHOSPHATE 10 MG/ML IJ SOLN
INTRAMUSCULAR | Status: DC | PRN
  Administered 2019-11-15: 4 mg via INTRAVENOUS

## 2019-11-15 MED ORDER — PROPOFOL 10 MG/ML IV BOLUS
INTRAVENOUS | Status: DC | PRN
  Administered 2019-11-15: 20 mg via INTRAVENOUS

## 2019-11-15 MED ORDER — BRIMONIDINE TARTRATE 0.2 % OP SOLN
1.0000 [drp] | Freq: Two times a day (BID) | OPHTHALMIC | Status: DC
  Administered 2019-11-15 – 2019-11-16 (×2): 1 [drp] via OPHTHALMIC
  Filled 2019-11-15 (×2): qty 5

## 2019-11-15 MED ORDER — BUPIVACAINE IN DEXTROSE 0.75-8.25 % IT SOLN
INTRATHECAL | Status: DC | PRN
  Administered 2019-11-15: 1.6 mL via INTRATHECAL

## 2019-11-15 MED ORDER — DIPHENHYDRAMINE HCL 12.5 MG/5ML PO ELIX
12.5000 mg | ORAL_SOLUTION | ORAL | Status: DC | PRN
  Administered 2019-11-16: 25 mg via ORAL
  Filled 2019-11-15: qty 10

## 2019-11-15 MED ORDER — METFORMIN HCL 500 MG PO TABS
500.0000 mg | ORAL_TABLET | Freq: Every day | ORAL | Status: DC
  Administered 2019-11-16: 500 mg via ORAL
  Filled 2019-11-15: qty 1

## 2019-11-15 MED ORDER — CEFAZOLIN SODIUM-DEXTROSE 2-4 GM/100ML-% IV SOLN
2.0000 g | INTRAVENOUS | Status: AC
  Administered 2019-11-15: 2 g via INTRAVENOUS
  Filled 2019-11-15: qty 100

## 2019-11-15 MED ORDER — ASPIRIN 81 MG PO CHEW
81.0000 mg | CHEWABLE_TABLET | Freq: Two times a day (BID) | ORAL | Status: DC
  Administered 2019-11-15 – 2019-11-16 (×2): 81 mg via ORAL
  Filled 2019-11-15 (×2): qty 1

## 2019-11-15 MED ORDER — TRANEXAMIC ACID-NACL 1000-0.7 MG/100ML-% IV SOLN
1000.0000 mg | INTRAVENOUS | Status: AC
  Administered 2019-11-15: 1000 mg via INTRAVENOUS
  Filled 2019-11-15: qty 100

## 2019-11-15 MED ORDER — LATANOPROST 0.005 % OP SOLN
1.0000 [drp] | Freq: Every day | OPHTHALMIC | Status: DC
  Administered 2019-11-15: 1 [drp] via OPHTHALMIC
  Filled 2019-11-15: qty 2.5

## 2019-11-15 MED ORDER — LINAGLIPTIN 5 MG PO TABS
5.0000 mg | ORAL_TABLET | Freq: Every day | ORAL | Status: DC
  Administered 2019-11-16: 5 mg via ORAL
  Filled 2019-11-15: qty 1

## 2019-11-15 MED ORDER — KETOROLAC TROMETHAMINE 30 MG/ML IJ SOLN
INTRAMUSCULAR | Status: AC
  Filled 2019-11-15: qty 1

## 2019-11-15 MED ORDER — MENTHOL 3 MG MT LOZG
1.0000 | LOZENGE | OROMUCOSAL | Status: DC | PRN
  Administered 2019-11-16: 3 mg via ORAL
  Filled 2019-11-15: qty 9

## 2019-11-15 MED ORDER — PHENOL 1.4 % MT LIQD: 1.0000 | OROMUCOSAL | Status: DC | PRN

## 2019-11-15 MED ORDER — MAGNESIUM CITRATE PO SOLN: 1.0000 | Freq: Once | ORAL | Status: DC | PRN

## 2019-11-15 MED ORDER — FERROUS SULFATE 325 (65 FE) MG PO TABS
325.0000 mg | ORAL_TABLET | Freq: Three times a day (TID) | ORAL | Status: DC
  Administered 2019-11-16 (×2): 325 mg via ORAL
  Filled 2019-11-15 (×2): qty 1

## 2019-11-15 MED ORDER — DEXAMETHASONE SODIUM PHOSPHATE 10 MG/ML IJ SOLN
10.0000 mg | Freq: Once | INTRAMUSCULAR | Status: AC
  Administered 2019-11-16: 10 mg via INTRAVENOUS
  Filled 2019-11-15: qty 1

## 2019-11-15 MED ORDER — METFORMIN HCL 500 MG PO TABS
1000.0000 mg | ORAL_TABLET | Freq: Two times a day (BID) | ORAL | Status: DC
  Administered 2019-11-15 – 2019-11-16 (×2): 1000 mg via ORAL
  Filled 2019-11-15 (×2): qty 2

## 2019-11-15 MED ORDER — LACTATED RINGERS IV SOLN: INTRAVENOUS | Status: DC

## 2019-11-15 MED ORDER — ACETAMINOPHEN 500 MG PO TABS
1000.0000 mg | ORAL_TABLET | Freq: Once | ORAL | Status: AC
  Administered 2019-11-15: 1000 mg via ORAL
  Filled 2019-11-15: qty 2

## 2019-11-15 MED ORDER — ALUM & MAG HYDROXIDE-SIMETH 200-200-20 MG/5ML PO SUSP: 15.0000 mL | ORAL | Status: DC | PRN

## 2019-11-15 SURGICAL SUPPLY — 50 items
ADH SKN CLS APL DERMABOND .7 (GAUZE/BANDAGES/DRESSINGS) ×1
BAG DECANTER FOR FLEXI CONT (MISCELLANEOUS) IMPLANT
BAG SPEC THK2 15X12 ZIP CLS (MISCELLANEOUS)
BAG ZIPLOCK 12X15 (MISCELLANEOUS) IMPLANT
BLADE SAG 18X100X1.27 (BLADE) ×3 IMPLANT
BLADE SURG SZ10 CARB STEEL (BLADE) ×6 IMPLANT
COVER PERINEAL POST (MISCELLANEOUS) ×3 IMPLANT
COVER SURGICAL LIGHT HANDLE (MISCELLANEOUS) ×3 IMPLANT
COVER WAND RF STERILE (DRAPES) IMPLANT
CUP ACET PINNCLE SECTR II 60MM (Hips) IMPLANT
DERMABOND ADVANCED (GAUZE/BANDAGES/DRESSINGS) ×2
DERMABOND ADVANCED .7 DNX12 (GAUZE/BANDAGES/DRESSINGS) ×1 IMPLANT
DRAPE STERI IOBAN 125X83 (DRAPES) ×3 IMPLANT
DRAPE U-SHAPE 47X51 STRL (DRAPES) ×6 IMPLANT
DRESSING AQUACEL AG SP 3.5X10 (GAUZE/BANDAGES/DRESSINGS) ×1 IMPLANT
DRSG AQUACEL AG ADV 3.5X10 (GAUZE/BANDAGES/DRESSINGS) ×2 IMPLANT
DRSG AQUACEL AG SP 3.5X10 (GAUZE/BANDAGES/DRESSINGS) ×3
DURAPREP 26ML APPLICATOR (WOUND CARE) ×3 IMPLANT
ELECT REM PT RETURN 15FT ADLT (MISCELLANEOUS) ×3 IMPLANT
ELIMINATOR HOLE APEX DEPUY (Hips) ×2 IMPLANT
GLOVE BIO SURGEON STRL SZ 6 (GLOVE) ×6 IMPLANT
GLOVE BIOGEL PI IND STRL 6.5 (GLOVE) ×1 IMPLANT
GLOVE BIOGEL PI IND STRL 7.5 (GLOVE) ×1 IMPLANT
GLOVE BIOGEL PI IND STRL 8.5 (GLOVE) ×1 IMPLANT
GLOVE BIOGEL PI INDICATOR 6.5 (GLOVE) ×2
GLOVE BIOGEL PI INDICATOR 7.5 (GLOVE) ×2
GLOVE BIOGEL PI INDICATOR 8.5 (GLOVE) ×2
GLOVE ECLIPSE 8.0 STRL XLNG CF (GLOVE) ×6 IMPLANT
GLOVE ORTHO TXT STRL SZ7.5 (GLOVE) ×6 IMPLANT
GOWN STRL REUS W/TWL LRG LVL3 (GOWN DISPOSABLE) ×6 IMPLANT
GOWN STRL REUS W/TWL XL LVL3 (GOWN DISPOSABLE) ×3 IMPLANT
HEAD M SROM 36MM PLUS 1.5 (Hips) IMPLANT
HOLDER FOLEY CATH W/STRAP (MISCELLANEOUS) ×3 IMPLANT
KIT TURNOVER KIT A (KITS) IMPLANT
LINER NEUTRAL 58X36MM PLUS4 ×2 IMPLANT
PACK ANTERIOR HIP CUSTOM (KITS) ×3 IMPLANT
PENCIL SMOKE EVACUATOR (MISCELLANEOUS) IMPLANT
PINNACLE SECTOR II CUP 60MM (Hips) ×3 IMPLANT
SCREW 6.5MMX25MM (Screw) ×2 IMPLANT
SROM M HEAD 36MM PLUS 1.5 (Hips) ×3 IMPLANT
STEM FEM ACTIS HIGH SZ10 (Stem) ×2 IMPLANT
SUT MNCRL AB 4-0 PS2 18 (SUTURE) ×3 IMPLANT
SUT STRATAFIX 0 PDS 27 VIOLET (SUTURE) ×3
SUT VIC AB 1 CT1 36 (SUTURE) ×9 IMPLANT
SUT VIC AB 2-0 CT1 27 (SUTURE) ×6
SUT VIC AB 2-0 CT1 TAPERPNT 27 (SUTURE) ×2 IMPLANT
SUTURE STRATFX 0 PDS 27 VIOLET (SUTURE) ×1 IMPLANT
TRAY FOLEY MTR SLVR 16FR STAT (SET/KITS/TRAYS/PACK) IMPLANT
WATER STERILE IRR 1000ML POUR (IV SOLUTION) ×3 IMPLANT
YANKAUER SUCT BULB TIP 10FT TU (MISCELLANEOUS) IMPLANT

## 2019-11-15 NOTE — Discharge Instructions (Signed)

## 2019-11-15 NOTE — Care Plan (Signed)
Ortho Bundle Case Management Note  Patient Details  Name: [name] MRN: [id number] Date of Birth: 1934-09-22  L THA on 11-15-19 DCP:  Home with wife.  1 story home with 4 ste. DME:  RW and 3-in-1 ordered through [facility] PT:  [facility]                   DME Arranged:  [place] rolling, 3-N-1 DME Agency:  [employer]  HH Arranged:  NA [place] Agency:  NA  Additional Comments: Please contact me with any questions of if this plan should need to change.  Marianne Sofia, RN,CCM [facility]  [phone] 11/15/2019, 4:57 PM

## 2019-11-15 NOTE — Op Note (Signed)
NAME:  [name]                ACCOUNT NO.: [id number]      MEDICAL RECORD NO.: [id number]      FACILITY:  [facility]      PHYSICIAN:  Mauri Pole  DATE OF BIRTH:  February 21, 1935     DATE OF PROCEDURE:  11/15/2019                                 OPERATIVE REPORT         PREOPERATIVE DIAGNOSIS: Left  hip osteoarthritis.      POSTOPERATIVE DIAGNOSIS:  Left hip osteoarthritis.      PROCEDURE:  Left total hip replacement through an anterior approach   utilizing DePuy THR system, component size 60 mm pinnacle cup, a size 36+4 neutral   Altrex liner, a size 10 Hi Actis stem with a 36+1.5 Articuleze metal head ball.      SURGEON:  Pietro Cassis. Alvan Dame, M.D.      ASSISTANT:  Danae Orleans, PA-C     ANESTHESIA:  Spinal.      SPECIMENS:  None.      COMPLICATIONS:  None.      BLOOD LOSS:  200 cc     DRAINS:  None.      INDICATION OF THE PROCEDURE:  [name] is a 85 y.o. male who had   presented to office for evaluation of left hip pain.  Radiographs revealed   progressive degenerative changes with bone-on-bone   articulation of the  hip joint, including subchondral cystic changes and osteophytes.  The patient had painful limited range of   motion significantly affecting their overall quality of life and function.  The patient was failing to    respond to conservative measures including medications and/or injections and activity modification and at this point was ready   to proceed with more definitive measures.  Consent was obtained for   benefit of pain relief.  Specific risks of infection, DVT, component   failure, dislocation, neurovascular injury, and need for revision surgery were reviewed in the office as well discussion of   the anterior versus posterior approach were reviewed.     PROCEDURE IN DETAIL:  The patient was brought to operative theater.   Once adequate anesthesia, preoperative antibiotics, 2 gm of Ancef, 1 gm of Tranexamic Acid,  and 10 mg of Decadron were administered, the patient was positioned supine on the [employer] table.  Once the patient was safely positioned with adequate padding of boney prominences we predraped out the hip, and used fluoroscopy to confirm orientation of the pelvis.      The left hip was then prepped and draped from proximal iliac crest to   mid thigh with a shower curtain technique.      Time-out was performed identifying the patient, planned procedure, and the appropriate extremity.     An incision was then made 2 cm lateral to the   anterior superior iliac spine extending over the orientation of the   tensor fascia lata muscle and sharp dissection was carried down to the   fascia of the muscle.      The fascia was then incised.  The muscle belly was identified and swept   laterally and retractor placed along the superior neck.  Following   cauterization of the circumflex vessels and removing some pericapsular  fat, a second cobra retractor was placed on the inferior neck.  A T-capsulotomy was made along the line of the   superior neck to the trochanteric fossa, then extended proximally and   distally.  Tag sutures were placed and the retractors were then placed   intracapsular.  We then identified the trochanteric fossa and   orientation of my neck cut and then made a neck osteotomy with the femur on traction.  The femoral   head was removed without difficulty or complication.  Traction was let   off and retractors were placed posterior and anterior around the   acetabulum.      The labrum and foveal tissue were debrided.  I began reaming with a 48 mm   reamer and reamed up to 59 mm reamer with good bony bed preparation and a 60 mm  cup was chosen.  The final 60 mm Pinnacle cup was then impacted under fluoroscopy to confirm the depth of penetration and orientation with respect to   Abduction and forward flexion.  A screw was placed into the ilium followed by the hole eliminator.  The  final   36+4 neutral Altrex liner was impacted with good visualized rim fit.  The cup was positioned anatomically within the acetabular portion of the pelvis.      At this point, the femur was rolled to 100 degrees.  Further capsule was   released off the inferior aspect of the femoral neck.  I then   released the superior capsule proximally.  With the leg in a neutral position the hook was placed laterally   along the femur under the vastus lateralis origin and elevated manually and then held in position using the hook attachment on the bed.  The leg was then extended and adducted with the leg rolled to 100   degrees of external rotation.  Retractors were placed along the medial calcar and posteriorly over the greater trochanter.  Once the proximal femur was fully   exposed, I used a box osteotome to set orientation.  I then began   broaching with the starting chili pepper broach and passed this by hand and then broached up to 10.  With the 10 broach in place I chose a high offset neck and did several trial reductions.  The offset was appropriate, leg lengths   appeared to be equal best matched with the +1.5 head ball trial confirmed radiographically.   Given these findings, I went ahead and dislocated the hip, repositioned all   retractors and positioned the right hip in the extended and abducted position.  The final 10 Hi Actis stem was   chosen and it was impacted down to the level of neck cut.  Based on this   and the trial reductions, a final 36+1.5 Articuleze metal head ball was chosen and   impacted onto a clean and dry trunnion, and the hip was reduced.  The   hip had been irrigated throughout the case again at this point.  I did   reapproximate the superior capsular leaflet to the anterior leaflet   using #1 Vicryl.  The fascia of the   tensor fascia lata muscle was then reapproximated using #1 Vicryl and #0 Stratafix sutures.  The   remaining wound was closed with 2-0 Vicryl and  running 4-0 Monocryl.   The hip was cleaned, dried, and dressed sterilely using Dermabond and   Aquacel dressing.  The patient was then brought   to recovery room  in stable condition tolerating the procedure well.    Danae Orleans, PA-C was present for the entirety of the case involved from   preoperative positioning, perioperative retractor management, general   facilitation of the case, as well as primary wound closure as assistant.            Pietro Cassis Alvan Dame, M.D.        11/15/2019 1:53 PM

## 2019-11-15 NOTE — Transfer of Care (Signed)
Immediate Anesthesia Transfer of Care Note  Patient: [name]  Procedure(s) Performed: Procedure(s) with comments: TOTAL HIP ARTHROPLASTY ANTERIOR APPROACH (Left) - 70 mins  Patient Location: PACU  Anesthesia Type:Spinal  Level of Consciousness:  sedated, patient cooperative and responds to stimulation  Airway & Oxygen Therapy:Patient Spontanous Breathing and Patient connected to face mask oxgen  Post-op Assessment:  Report given to PACU RN and Post -op Vital signs reviewed and stable  Post vital signs:  Reviewed and stable  Last Vitals:  Vitals:   11/15/19 1106 11/15/19 1412  BP: (!) 157/75 (P) 104/60  Pulse: (!) 57   Resp: 16   Temp: 36.7 C (P) 36.6 C  SpO2: [id number]     Complications: No apparent anesthesia complications

## 2019-11-15 NOTE — Anesthesia Postprocedure Evaluation (Signed)
Anesthesia Post Note  Patient: [name]  Procedure(s) Performed: TOTAL HIP ARTHROPLASTY ANTERIOR APPROACH (Left Hip)     Patient location during evaluation: PACU Anesthesia Type: Spinal Level of consciousness: oriented and awake and alert Pain management: pain level controlled Vital Signs Assessment: post-procedure vital signs reviewed and stable Respiratory status: spontaneous breathing, respiratory function stable and patient connected to nasal cannula oxygen Cardiovascular status: blood pressure returned to baseline and stable Postop Assessment: no headache, no backache and no apparent nausea or vomiting Anesthetic complications: no    Last Vitals:  Vitals:   11/15/19 1545 11/15/19 1602  BP: 138/64 (!) 160/70  Pulse: (!) 56 (!) 53  Resp: 15 15  Temp:  36.9 C  SpO2: 100% 100%    Last Pain:  Vitals:   11/15/19 1545  TempSrc:   PainSc: 0-No pain                 Zelena Bushong L Rianna Lukes

## 2019-11-15 NOTE — Interval H&P Note (Signed)
History and Physical Interval Note:  11/15/2019 11:22 AM  [name]  has presented today for surgery, with the diagnosis of Left hip osteoarthritis.  The various methods of treatment have been discussed with the patient and family. After consideration of risks, benefits and other options for treatment, the patient has consented to  Procedure(s) with comments: TOTAL HIP ARTHROPLASTY ANTERIOR APPROACH (Left) - 70 mins as a surgical intervention.  The patient's history has been reviewed, patient examined, no change in status, stable for surgery.  I have reviewed the patient's chart and labs.  Questions were answered to the patient's satisfaction.     Mauri Pole

## 2019-11-15 NOTE — Evaluation (Signed)
Physical Therapy Evaluation Patient Details Name: [name] MRN: [id number] DOB: [birth year] Today's Date: 11/15/2019   History of Present Illness  Patient is 85 y.o. male s/p Lt THA anterior approach on 11/15/19 with PMH significant for prostate cancer, HLD, glaucoma, GERD, DM, OA.    Clinical Impression  [name] is a 85 y.o. male POD 0 s/p Lt THA anterior approach. Patient reports independence with mobility at baseline. Patient is now limited by functional impairments (see PT problem list below) and requires min assist for transfers and gait with RW. Patient was able to ambulate ~70 feet with RW and min assist. Patient instructed in exercise to facilitate ROM and circulation. Patient will benefit from continued skilled PT interventions to address impairments and progress towards PLOF. Acute PT will follow to progress mobility and stair training in preparation for safe discharge home.     Follow Up Recommendations Follow surgeon's recommendation for DC plan and follow-up therapies    Equipment Recommendations  Rolling walker with 5" wheels;3in1 (PT)    Recommendations for Other Services       Precautions / Restrictions Precautions Precautions: Fall Restrictions Weight Bearing Restrictions: No Other Position/Activity Restrictions: WBAT      Mobility  Bed Mobility Overal bed mobility: Needs Assistance Bed Mobility: Supine to Sit     Supine to sit: Min assist;HOB elevated     General bed mobility comments: cues for use of bed rail, assist to brign Lt LE off EOB and raise trunk.  Transfers Overall transfer level: Needs assistance Equipment used: Rolling walker (2 wheeled) Transfers: Sit to/from Stand Sit to Stand: Min assist         General transfer comment: cues for hand placement and assist required to complete power up and steady with rise.   Ambulation/Gait Ambulation/Gait assistance: Min assist Gait Distance (Feet): 70 Feet Assistive device:  Rolling walker (2 wheeled) Gait Pattern/deviations: Step-to pattern;Decreased stride length;Decreased weight shift to left Gait velocity: decreased   General Gait Details: cues for safe step pattern and proximity to RW, assist required to correct step pattern and maintain safe walker position.   Stairs         Wheelchair Mobility    Modified Rankin (Stroke Patients Only)       Balance Overall balance assessment: Needs assistance Sitting-balance support: Feet supported Sitting balance-Leahy Scale: Good     Standing balance support: During functional activity;Bilateral upper extremity supported Standing balance-Leahy Scale: Fair            Pertinent Vitals/Pain Pain Assessment: 0-10 Pain Score: 3  Pain Location: Lt hip Pain Intervention(s): Monitored during session;Limited activity within patient's tolerance;Repositioned;Ice applied    Home Living Family/patient expects to be discharged to:: Private residence Living Arrangements: Spouse/significant other Available Help at Discharge: Family Type of Home: House Home Access: Stairs to enter Entrance Stairs-Rails: Left Entrance Stairs-Number of Steps: 4-5 Home Layout: One level;Laundry or work area in basement(pt has ) Home Equipment: Miller;Shower seat      Prior Function Level of Independence: Independent               Hand Dominance   Dominant Hand: Right    Extremity/Trunk Assessment   Upper Extremity Assessment Upper Extremity Assessment: Overall WFL for tasks assessed    Lower Extremity Assessment Lower Extremity Assessment: Overall WFL for tasks assessed    Cervical / Trunk Assessment Cervical / Trunk Assessment: Normal  Communication   Communication: No difficulties;HOH(wears hearing aids)  Cognition Arousal/Alertness: Awake/alert  Behavior During Therapy: WFL for tasks assessed/performed Overall Cognitive Status: Within Functional Limits for tasks assessed          General Comments      Exercises Total Joint Exercises Ankle Circles/Pumps: AROM;20 reps;Both;Seated Quad Sets: AROM;Left;5 reps;Seated Heel Slides: AROM;Left;5 reps;Seated   Assessment/Plan    PT Assessment Patient needs continued PT services  PT Problem List Decreased strength;Decreased range of motion;Decreased activity tolerance;Decreased balance;Decreased mobility;Decreased knowledge of use of DME       PT Treatment Interventions DME instruction;Gait training;Stair training;Therapeutic activities;Functional mobility training;Therapeutic exercise;Balance training;Patient/family education    PT Goals (Current goals can be found in the Care Plan section)  Acute Rehab PT Goals Patient Stated Goal: to get home and back to independence PT Goal Formulation: With patient/family Time For Goal Achievement: 11/22/19 Potential to Achieve Goals: Good    Frequency 7X/week    AM-PAC PT "6 Clicks" Mobility  Outcome Measure Help needed turning from your back to your side while in a flat bed without using bedrails?: A Little Help needed moving from lying on your back to sitting on the side of a flat bed without using bedrails?: A Little Help needed moving to and from a bed to a chair (including a wheelchair)?: A Little Help needed standing up from a chair using your arms (e.g., wheelchair or bedside chair)?: A Little Help needed to walk in hospital room?: A Little Help needed climbing 3-5 steps with a railing? : A Little 6 Click Score: 18    End of Session Equipment Utilized During Treatment: Gait belt Activity Tolerance: Patient tolerated treatment well Patient left: in chair;with call bell/phone within reach;with chair alarm set;with family/visitor present Nurse Communication: Mobility status PT Visit Diagnosis: Muscle weakness (generalized) (M62.81);Difficulty in walking, not elsewhere classified (R26.2)    Time: [id number] PT Time Calculation (min) (ACUTE ONLY): 33  min   Charges:   PT Evaluation $PT Eval Low Complexity: 1 Low PT Treatments $Gait Training: 8-22 mins        Verner Mould, DPT Physical Therapist with [facility] [phone]  11/15/2019 7:15 PM

## 2019-11-15 NOTE — Anesthesia Procedure Notes (Addendum)
Spinal  Patient location during procedure: OR Start time: 11/15/2019 12:28 PM End time: 11/15/2019 1:23 AM Staffing Performed: anesthesiologist  Anesthesiologist: Freddrick March, MD Preanesthetic Checklist Completed: patient identified, IV checked, risks and benefits discussed, surgical consent, monitors and equipment checked, pre-op evaluation and timeout performed Spinal Block Patient position: sitting Prep: DuraPrep and site prepped and draped Patient monitoring: cardiac monitor, continuous pulse ox and blood pressure Approach: midline Location: L3-4 Injection technique: single-shot Needle Needle type: Pencan  Needle gauge: 24 G Needle length: 9 cm Assessment Sensory level: T6 Additional Notes Functioning IV was confirmed and monitors were applied. Sterile prep and drape, including hand hygiene and sterile gloves were used. The patient was positioned and the spine was prepped. The skin was anesthetized with lidocaine.  Free flow of clear CSF was obtained prior to injecting local anesthetic into the CSF.  The spinal needle aspirated freely following injection.  The needle was carefully withdrawn.  The patient tolerated the procedure well.

## 2019-11-15 NOTE — Anesthesia Preprocedure Evaluation (Addendum)
Anesthesia Evaluation  Patient identified by MRN, date of birth, ID band Patient awake    Reviewed: Allergy & Precautions, NPO status , Patient's Chart, lab work & pertinent test results  Airway Mallampati: II  TM Distance: >3 FB Neck ROM: Full    Dental no notable dental hx. (+) Teeth Intact, Dental Advisory Given   Pulmonary neg pulmonary ROS,    Pulmonary exam normal breath sounds clear to auscultation       Cardiovascular Normal cardiovascular exam Rhythm:Regular Rate:Normal  HLD   Neuro/Psych negative neurological ROS  negative psych ROS   GI/Hepatic Neg liver ROS, GERD  ,  Endo/Other  diabetes, Type 2, Oral Hypoglycemic Agents  Renal/GU negative Renal ROS  negative genitourinary   Musculoskeletal  (+) Arthritis ,   Abdominal   Peds  Hematology negative hematology ROS (+)   Anesthesia Other Findings   Reproductive/Obstetrics                            Anesthesia Physical Anesthesia Plan  ASA: II  Anesthesia Plan: Spinal   Post-op Pain Management:    Induction:   PONV Risk Score and Plan: Treatment may vary due to age or medical condition, Propofol infusion and Ondansetron  Airway Management Planned: Natural Airway  Additional Equipment:   Intra-op Plan:   Post-operative Plan:   Informed Consent: I have reviewed the patients History and Physical, chart, labs and discussed the procedure including the risks, benefits and alternatives for the proposed anesthesia with the patient or authorized representative who has indicated his/her understanding and acceptance.     Dental advisory given  Plan Discussed with: CRNA  Anesthesia Plan Comments:         Anesthesia Quick Evaluation

## 2019-11-16 ENCOUNTER — Encounter (HOSPITAL_COMMUNITY): Payer: Self-pay | Admitting: Orthopedic Surgery

## 2019-11-16 DIAGNOSIS — M1612 Unilateral primary osteoarthritis, left hip: Secondary | ICD-10-CM | POA: Diagnosis not present

## 2019-11-16 LAB — CBC
HCT: 32.1 % — ABNORMAL LOW (ref 39.0–52.0)
Hemoglobin: 10.6 g/dL — ABNORMAL LOW (ref 13.0–17.0)
MCH: 31.4 pg (ref 26.0–34.0)
MCHC: 33 g/dL (ref 30.0–36.0)
MCV: 95 fL (ref 80.0–100.0)
Platelets: 197 10*3/uL (ref 150–400)
RBC: 3.38 MIL/uL — ABNORMAL LOW (ref 4.22–5.81)
RDW: 13.2 % (ref 11.5–15.5)
WBC: 11.3 10*3/uL — ABNORMAL HIGH (ref 4.0–10.5)
nRBC: 0 % (ref 0.0–0.2)

## 2019-11-16 LAB — BASIC METABOLIC PANEL
Anion gap: 9 (ref 5–15)
BUN: 20 mg/dL (ref 8–23)
CO2: 21 mmol/L — ABNORMAL LOW (ref 22–32)
Calcium: 8.1 mg/dL — ABNORMAL LOW (ref 8.9–10.3)
Chloride: 107 mmol/L (ref 98–111)
Creatinine, Ser: 1.14 mg/dL (ref 0.61–1.24)
GFR calc Af Amer: >60 mL/min (ref >60)
GFR calc non Af Amer: 60 mL/min — ABNORMAL LOW (ref >60)
Glucose, Bld: 179 mg/dL — ABNORMAL HIGH (ref 70–99)
Potassium: 3.9 mmol/L (ref 3.5–5.1)
Sodium: 137 mmol/L (ref 135–145)

## 2019-11-16 LAB — GLUCOSE, CAPILLARY
Glucose-Capillary: 223 mg/dL — ABNORMAL HIGH (ref 70–99)
Glucose-Capillary: 220 mg/dL — ABNORMAL HIGH (ref 70–99)

## 2019-11-16 MED ORDER — DOCUSATE SODIUM 100 MG PO CAPS
100.0000 mg | ORAL_CAPSULE | Freq: Two times a day (BID) | ORAL | 0 refills | Status: DC
Start: 2019-11-16 — End: 2020-01-27

## 2019-11-16 MED ORDER — METHOCARBAMOL 500 MG PO TABS: 500.0000 mg | ORAL_TABLET | Freq: Four times a day (QID) | ORAL | 0 refills | Status: DC | PRN

## 2019-11-16 MED ORDER — HYDROCODONE-ACETAMINOPHEN 5-325 MG PO TABS: 1.0000 | ORAL_TABLET | ORAL | 0 refills | Status: DC | PRN

## 2019-11-16 MED ORDER — ASPIRIN 81 MG PO CHEW
81.0000 mg | CHEWABLE_TABLET | Freq: Two times a day (BID) | ORAL | 0 refills | Status: AC
Start: 2019-11-17 — End: 2019-12-17

## 2019-11-16 MED ORDER — POLYETHYLENE GLYCOL 3350 17 G PO PACK
17.0000 g | PACK | Freq: Two times a day (BID) | ORAL | 0 refills | Status: DC
Start: 2019-11-16 — End: 2020-01-27

## 2019-11-16 MED ORDER — FERROUS SULFATE 325 (65 FE) MG PO TABS: 325.0000 mg | ORAL_TABLET | Freq: Three times a day (TID) | ORAL | 0 refills | Status: DC

## 2019-11-16 NOTE — Progress Notes (Signed)
Physical Therapy Treatment Patient Details Name: [name] MRN: [id number] DOB: 01-Sep-1934 Today's Date: 11/16/2019    History of Present Illness Patient is 85 y.o. male s/p Lt THA anterior approach on 11/15/19 with PMH significant for prostate cancer, HLD, glaucoma, GERD, DM, OA.    PT Comments    POD # 1 am session Assisted OOB to amb in hallway.  General bed mobility comments: demonstarted and instructed how to use wa belt to self assist LE off bed  General transfer comment: 25% VC's on proper hand placement and safety with turns  General Gait Details: 25% VC's on proper walker to self distance and safety with turns.  Then returned to room to perform some TE's following HEP handout.  Instructed on proper tech, freq as well as use of ICE.  Nurse Communication: Mobility status(pt will need another PT session to address stairs prior to D/C today)     Follow Up Recommendations  Follow surgeon's recommendation for DC plan and follow-up therapies     Equipment Recommendations  Rolling walker with 5" wheels;3in1 (PT)    Recommendations for Other Services       Precautions / Restrictions Precautions Precautions: Fall Restrictions Weight Bearing Restrictions: No LLE Weight Bearing: Weight bearing as tolerated    Mobility  Bed Mobility Overal bed mobility: Needs Assistance Bed Mobility: Supine to Sit     Supine to sit: Supervision;Min guard     General bed mobility comments: demonstarted and instructed how to use wa belt to self assist LE off bed  Transfers Overall transfer level: Needs assistance Equipment used: Rolling walker (2 wheeled) Transfers: Sit to/from [employer] Transfers Sit to Stand: Supervision Stand pivot transfers: Supervision;Min guard       General transfer comment: 25% VC's on proper hand placement and safety with turns  Ambulation/Gait Ambulation/Gait assistance: Supervision;Min guard Gait Distance (Feet): 80 Feet Assistive device:  Rolling walker (2 wheeled) Gait Pattern/deviations: Step-to pattern;Decreased stride length;Decreased weight shift to left Gait velocity: decreased   General Gait Details: 25% VC's on proper walker to self distance and safety with turns   Marine scientist Rankin (Stroke Patients Only)       Balance                                            Cognition Arousal/Alertness: Awake/alert Behavior During Therapy: WFL for tasks assessed/performed Overall Cognitive Status: Within Functional Limits for tasks assessed                                        Exercises   Total Hip Replacement TE's following HEP Handout 10 reps ankle pumps 05 reps knee presses 05 reps heel slides 05 reps SAQ's 05 reps ABD Instructed how to use a belt loop to assist  Followed by ICE     General Comments        Pertinent Vitals/Pain Pain Score: 3  Pain Location: L hip Pain Intervention(s): Monitored during session;Repositioned;Ice applied;Premedicated before session    Home Living                      Prior Function  PT Goals (current goals can now be found in the care plan section) Progress towards PT goals: Progressing toward goals    Frequency    7X/week      PT Plan Current plan remains appropriate    Co-evaluation              AM-PAC PT "6 Clicks" Mobility   Outcome Measure  Help needed turning from your back to your side while in a flat bed without using bedrails?: None Help needed moving from lying on your back to sitting on the side of a flat bed without using bedrails?: None Help needed moving to and from a bed to a chair (including a wheelchair)?: None Help needed standing up from a chair using your arms (e.g., wheelchair or bedside chair)?: A Little Help needed to walk in hospital room?: A Little Help needed climbing 3-5 steps with a railing? : A Little 6 Click  Score: 21    End of Session Equipment Utilized During Treatment: Gait belt Activity Tolerance: Patient tolerated treatment well Patient left: in chair;with call bell/phone within reach;with chair alarm set;with family/visitor present Nurse Communication: Mobility status(pt will need another PT session to address stairs prior to D/C today) PT Visit Diagnosis: Muscle weakness (generalized) (M62.81);Difficulty in walking, not elsewhere classified (R26.2)     Time: [id number] PT Time Calculation (min) (ACUTE ONLY): 29 min  Charges:  $Gait Training: 8-22 mins $Therapeutic Activity: 8-22 mins                     Rica Koyanagi  PTA Acute  Rehabilitation Services Pager      [phone] Office      [phone]

## 2019-11-16 NOTE — TOC Transition Note (Signed)
Transition of Care [facility]) - CM/SW Discharge Note   Patient Details  Name: [name] MRN: [id number] Date of Birth: October 10, 1934  Transition of Care [facility]) CM/SW Contact:  Lia Hopping, [place] Phone Number: 11/16/2019, 9:51 AM   Clinical Narrative:  RW and 3 in 1 delivered to patient beside by [employer].      Patient Goals and CMS Choice        Discharge Placement                       Discharge Plan and Services                DME Arranged: Walker rolling, 3-N-1 DME Agency: [employer]       HH Arranged: NA HH Agency: NA        Social Determinants of Health (SDOH) Interventions     Readmission Risk Interventions No flowsheet data found.

## 2019-11-16 NOTE — Progress Notes (Signed)
Physical Therapy Treatment Patient Details Name: [name] MRN: [id number] DOB: 1934/12/05 Today's Date: 11/16/2019    History of Present Illness Patient is 85 y.o. male s/p Lt THA anterior approach on 11/15/19 with PMH significant for prostate cancer, HLD, glaucoma, GERD, DM, OA.    PT Comments    POD # 1 pm session Spouse present during session for education.  Assisted with amb an increased distance inh hallway, practiced stairs then Then returned to room to perform some TE's following HEP handout.  Instructed on proper tech, freq as well as use of ICE.  Addressed all mobility questions, discussed appropriate activity, educated on use of ICE.  Pt ready for D/C to home.    Follow Up Recommendations  Follow surgeon's recommendation for DC plan and follow-up therapies     Equipment Recommendations  Rolling walker with 5" wheels;3in1 (PT)    Recommendations for Other Services       Precautions / Restrictions Precautions Precautions: Fall Restrictions Weight Bearing Restrictions: No LLE Weight Bearing: Weight bearing as tolerated    Mobility  Bed Mobility   General bed mobility comments: OOB walking out of bathroom with NT  Transfers Overall transfer level: Needs assistance Equipment used: Rolling walker (2 wheeled) Transfers: Sit to/from [employer] Transfers Sit to Stand: Supervision Stand pivot transfers: Supervision;Min guard       General transfer comment: 25% VC's on proper hand placement and safety with turns  Ambulation/Gait Ambulation/Gait assistance: Supervision;Min guard Gait Distance (Feet): 115 Feet Assistive device: Rolling walker (2 wheeled) Gait Pattern/deviations: Step-to pattern;Decreased stride length;Decreased weight shift to left Gait velocity: decreased   General Gait Details: 25% VC's on proper walker to self distance and safety with turns   Stairs Stairs: Yes Stairs assistance: Supervision;Min guard Stair Management: One  rail Left;Step to pattern;Forwards;Sideways Number of Stairs: 2 General stair comments: with spouse present "hands on" assist up 2 steps using L rail and 50% VC's on proper tech   Wheelchair Mobility    Modified Rankin (Stroke Patients Only)       Balance                                            Cognition Arousal/Alertness: Awake/alert Behavior During Therapy: WFL for tasks assessed/performed Overall Cognitive Status: Within Functional Limits for tasks assessed                                        Exercises  5 reps all standing TE's     General Comments        Pertinent Vitals/Pain Pain Score: 3  Pain Location: L hip Pain Intervention(s): Monitored during session;Repositioned;Ice applied;Premedicated before session    Home Living                      Prior Function            PT Goals (current goals can now be found in the care plan section) Progress towards PT goals: Progressing toward goals    Frequency    7X/week      PT Plan Current plan remains appropriate    Co-evaluation              AM-PAC PT "6 Clicks" Mobility   Outcome Measure  Help  needed turning from your back to your side while in a flat bed without using bedrails?: None Help needed moving from lying on your back to sitting on the side of a flat bed without using bedrails?: None Help needed moving to and from a bed to a chair (including a wheelchair)?: None Help needed standing up from a chair using your arms (e.g., wheelchair or bedside chair)?: A Little Help needed to walk in hospital room?: A Little Help needed climbing 3-5 steps with a railing? : A Little 6 Click Score: 21    End of Session Equipment Utilized During Treatment: Gait belt Activity Tolerance: Patient tolerated treatment well Patient left: in chair;with call bell/phone within reach;with chair alarm set;with family/visitor present Nurse Communication: Mobility  status(pt will need another PT session to address stairs prior to D/C today) PT Visit Diagnosis: Muscle weakness (generalized) (M62.81);Difficulty in walking, not elsewhere classified (R26.2)     Time: [id number] PT Time Calculation (min) (ACUTE ONLY): 25 min  Charges:  $Gait Training: 8-22 mins $Therapeutic Exercise: 8-22 mins                     Rica Koyanagi  PTA Acute  Rehabilitation Services Pager      [phone] Office      [phone]

## 2019-11-16 NOTE — Progress Notes (Signed)
Pt provided with d/c instructions. After discussing the pt's plan of care upon d/c home, the pt denied any further questions or concerns.  

## 2019-11-16 NOTE — Progress Notes (Signed)
     Subjective: 1 Day Post-Op Procedure(s) (LRB): TOTAL HIP ARTHROPLASTY ANTERIOR APPROACH (Left)   Patient reports pain as mild, pain controlled medication.  No reported events throughout the night.  We discussed the procedure, findings and expectations moving forward.  Patient is ready be discharged home, if he does well therapy.  Patient is to call with any questions or concerns.  Patient follow-up in the clinic in 2 weeks.   Objective:   VITALS:   Vitals:   11/16/19 0141 11/16/19 0549  BP: 137/76 132/68  Pulse: 85 84  Resp: 16 18  Temp: 98.5 F (36.9 C) 98.3 F (36.8 C)  SpO2: 97% 95%    Dorsiflexion/Plantar flexion intact Incision: dressing C/D/I No cellulitis present Compartment soft  LABS Recent Labs    11/16/19 0255  HGB 10.6*  HCT 32.1*  WBC 11.3*  PLT 197    Recent Labs    11/16/19 0255  NA 137  K 3.9  BUN 20  CREATININE 1.14  GLUCOSE 179*     Assessment/Plan: 1 Day Post-Op Procedure(s) (LRB): TOTAL HIP ARTHROPLASTY ANTERIOR APPROACH (Left)  Advance diet Up with therapy D/C IV fluids Discharge home Follow up in 2 weeks at [facility] Follow up with OLIN,Yavier Snider D in 2 weeks.  Contact information:  [facility] [address], [address] [place] [id number]          Danae Orleans PA-C  [facility]  [place] [address] [place], [place], [place] [address] Phone: [phone] [email or web address] Facebook  [employer]

## 2019-11-17 NOTE — Addendum Note (Signed)
Addendum  created 11/17/19 1437 by Freddrick March, MD   Clinical Note Signed, Intraprocedure Blocks edited

## 2019-11-22 NOTE — Discharge Summary (Signed)
Physician Discharge Summary  Patient ID: MEZIAH LIEBER MRN: [id number] DOB/AGE: [birth year] 85 y.o.  Admit date: 11/15/2019 Discharge date: 11/16/2019   Procedures:  Procedure(s) (LRB): TOTAL HIP ARTHROPLASTY ANTERIOR APPROACH (Left)  Attending Physician:  Dr. Paralee Cancel   Admission Diagnoses:    Left hip primary OA / pain  Discharge Diagnoses:  Principal Problem:   S/P left THA, AA Active Problems:   S/P hip replacement, left  Past Medical History:  Diagnosis Date  . Allergy    mild  . Arthritis    fingers, hip  . BPH (benign prostatic hypertrophy)   . Cataract    removed both eyes  . Complication of anesthesia    gases nasally causes sneezing   . Diabetes mellitus 2000   type 2  . Diabetes mellitus type 2 in nonobese [facility]) 06/19/2009   Qualifier: Diagnosis of  By: Wynona Luna    . GERD (gastroesophageal reflux disease)   . Glaucoma    followed by Dr Lanell Matar at [facility]  . Hyperlipidemia   . Left hip pain 03/14/2012  . Malignant neoplasm of prostate ([place]) 03/15/2019  . Medicare annual wellness visit, subsequent 12/31/2015  . Onychomycosis 09/17/2015  . Prostate cancer ([place])   . Tubular adenoma of colon 2003  . Vasomotor rhinitis 05/02/2013    HPI:    ZACHERIA TOOKE, 85 y.o. male, has a history of pain and functional disability in the left hip(s) due to arthritis and patient has failed non-surgical conservative treatments for greater than 12 weeks to include NSAID's and/or analgesics, corticosteriod injections and activity modification.  Onset of symptoms was gradual starting 2+ years ago with gradually worsening course since that time.The patient noted no past surgery on the left hip(s).  Patient currently rates pain in the left hip at 7 out of 10 with activity. Patient has night pain, worsening of pain with activity and weight bearing, trendelenberg gait, pain that interfers with activities of daily living and pain with passive range of motion.  Patient has evidence of periarticular osteophytes and joint space narrowing by imaging studies. This condition presents safety issues increasing the risk of falls.  There is no current active infection.  Risks, benefits and expectations were discussed with the patient.  Risks including but not limited to the risk of anesthesia, blood clots, nerve damage, blood vessel damage, failure of the prosthesis, infection and up to and including death.  Patient understand the risks, benefits and expectations and wishes to proceed with surgery.   PCP: Mosie Lukes, MD   Discharged Condition: good  Hospital Course:  Patient underwent the above stated procedure on 11/15/2019. Patient tolerated the procedure well and brought to the recovery room in good condition and subsequently to the floor.  POD #1 BP: 132/68 ; Pulse: 84 ; Temp: 98.3 F (36.8 C) ; Resp: 18 Patient reports pain as mild, pain controlled medication.  No reported events throughout the night.  We discussed the procedure, findings and expectations moving forward.  Patient is ready be discharged home, if he does well therapy.  Patient is to call with any questions or concerns.  Patient follow-up in the clinic in 2 weeks. Dorsiflexion/plantar flexion intact, incision: dressing C/D/I, no cellulitis present and compartment soft.   LABS  Basename    HGB     10.6  HCT     32.1    Discharge Exam: General appearance: alert, cooperative and no distress Extremities: Homans sign is negative, no sign of DVT,  no edema, redness or tenderness in the calves or thighs and no ulcers, gangrene or trophic changes  Disposition: Home with follow up in 2 weeks   Follow-up Information    Danae Orleans, PA-C. Go on 11/30/2019.   Specialty: Orthopedic Surgery Why: You are scheduled for a post-operative appointment on 11-30-19 at 10:15 am.  Contact information: [address] [place] [address] [id number]           Discharge  Instructions    Call MD / Call 911   Complete by: As directed    If you experience chest pain or shortness of breath, CALL 911 and be transported to the hospital emergency room.  If you develope a fever above 101 F, pus (white drainage) or increased drainage or redness at the wound, or calf pain, call your surgeon's office.   Change dressing   Complete by: As directed    Maintain surgical dressing until follow up in the clinic. If the edges start to pull up, may reinforce with tape. If the dressing is no longer working, may remove and cover with gauze and tape, but must keep the area dry and clean.  Call with any questions or concerns.   Constipation Prevention   Complete by: As directed    Drink plenty of fluids.  Prune juice may be helpful.  You may use a stool softener, such as Colace (over the counter) 100 mg twice a day.  Use MiraLax (over the counter) for constipation as needed.   Diet - low sodium heart healthy   Complete by: As directed    Discharge instructions   Complete by: As directed    Maintain surgical dressing until follow up in the clinic. If the edges start to pull up, may reinforce with tape. If the dressing is no longer working, may remove and cover with gauze and tape, but must keep the area dry and clean.  Follow up in 2 weeks at [facility]. Call with any questions or concerns.   Increase activity slowly as tolerated   Complete by: As directed    Weight bearing as tolerated with assist device (walker, cane, etc) as directed, use it as long as suggested by your surgeon or therapist, typically at least 4-6 weeks.   TED hose   Complete by: As directed    Use stockings (TED hose) for 2 weeks on both leg(s).  You may remove them at night for sleeping.      Allergies as of 11/16/2019      Reactions   Levaquin [levofloxacin In D5w] Hives      Medication List    STOP taking these medications   Ibuprofen 200 MG Caps   silodosin 8 MG Caps capsule Commonly known as:  RAPAFLO   vardenafil 20 MG tablet Commonly known as: LEVITRA     TAKE these medications   aspirin 81 MG chewable tablet Commonly known as: Aspirin Childrens Chew 1 tablet (81 mg total) by mouth 2 (two) times daily. Take for 4 weeks, then resume regular dose.   brinzolamide 1 % ophthalmic suspension Commonly known as: AZOPT Place 1 drop into the right eye 3 (three) times daily.   cholecalciferol 25 MCG (1000 UNIT) tablet Commonly known as: VITAMIN D3 Take 1,000 Units by mouth daily.   Combigan 0.2-0.5 % ophthalmic solution Generic drug: brimonidine-timolol Place 1 drop into the right eye 2 (two) times daily.   docusate sodium 100 MG capsule Commonly known as: Colace Take 1 capsule (100  mg total) by mouth 2 (two) times daily.   famotidine 20 MG tablet Commonly known as: PEPCID TAKE 1 TABLET DAILY AS NEEDED FOR HEARTBURN OR INDIGESTION What changed: See the new instructions.   ferrous sulfate 325 (65 FE) MG tablet Commonly known as: FerrouSul Take 1 tablet (325 mg total) by mouth 3 (three) times daily with meals for 14 days.   glyBURIDE 2.5 MG tablet Commonly known as: DIABETA Take 1 tablet (2.5 mg total) by mouth 2 (two) times daily with a meal.   HYDROcodone-acetaminophen 5-325 MG tablet Commonly known as: Norco Take 1-2 tablets by mouth every 4 (four) hours as needed for moderate pain or severe pain.   ipratropium 0.03 % nasal spray Commonly known as: ATROVENT Place 2 sprays into both nostrils 2 (two) times daily as needed for rhinitis.   Januvia 100 MG tablet Generic drug: sitaGLIPtin TAKE 1 TABLET DAILY   metFORMIN 1000 MG tablet Commonly known as: GLUCOPHAGE TAKE 1 TABLET TWICE A DAY AND ONE-HALF (1/2) TABLET EVERY NOON What changed:   how much to take  how to take this  when to take this  additional instructions   methocarbamol 500 MG tablet Commonly known as: Robaxin Take 1 tablet (500 mg total) by mouth every 6 (six) hours as needed for muscle  spasms.   multivitamin with minerals Tabs tablet Take 1 tablet by mouth daily.   polyethylene glycol 17 g packet Commonly known as: MIRALAX / GLYCOLAX Take 17 g by mouth 2 (two) times daily.   simvastatin 10 MG tablet Commonly known as: ZOCOR TAKE 1 TABLET AT BEDTIME   travoprost (benzalkonium) 0.004 % ophthalmic solution Commonly known as: TRAVATAN Place 1 drop into the right eye at bedtime.   True Metrix Blood Glucose Test test strip Generic drug: glucose blood TEST EVERY DAY   valACYclovir 1000 MG tablet Commonly known as: VALTREX TAKE 1 TABLET DAILY What changed: how much to take   [facility] Lancets Misc Use as directed once daily to check blood sugar.  DX E11.9            Discharge Care Instructions  (From admission, onward)         Start     Ordered   11/16/19 0000  Change dressing    Comments: Maintain surgical dressing until follow up in the clinic. If the edges start to pull up, may reinforce with tape. If the dressing is no longer working, may remove and cover with gauze and tape, but must keep the area dry and clean.  Call with any questions or concerns.   11/16/19 [id number]           Signed: [place] Pugh. Jalaila Caradonna   PA-C  11/22/2019, 8:16 AM

## 2020-01-10 ENCOUNTER — Other Ambulatory Visit: Payer: Self-pay | Admitting: Family Medicine

## 2020-01-17 ENCOUNTER — Telehealth: Payer: Self-pay | Admitting: *Deleted

## 2020-01-17 ENCOUNTER — Other Ambulatory Visit: Payer: Self-pay | Admitting: Family Medicine

## 2020-01-17 DIAGNOSIS — E782 Mixed hyperlipidemia: Secondary | ICD-10-CM

## 2020-01-17 DIAGNOSIS — D72829 Elevated white blood cell count, unspecified: Secondary | ICD-10-CM

## 2020-01-17 DIAGNOSIS — E119 Type 2 diabetes mellitus without complications: Secondary | ICD-10-CM

## 2020-01-17 NOTE — Telephone Encounter (Signed)
Labs ordered thank s 

## 2020-01-17 NOTE — Telephone Encounter (Signed)
Pt has lab appt 7/16 and f/u with PCP 7/23.  I do not see any future lab orders in Epic.  Please place future lab orders if appropriate.

## 2020-01-20 ENCOUNTER — Other Ambulatory Visit (INDEPENDENT_AMBULATORY_CARE_PROVIDER_SITE_OTHER): Payer: Medicare Other

## 2020-01-20 ENCOUNTER — Other Ambulatory Visit: Payer: Self-pay

## 2020-01-20 DIAGNOSIS — E119 Type 2 diabetes mellitus without complications: Secondary | ICD-10-CM

## 2020-01-20 DIAGNOSIS — D72829 Elevated white blood cell count, unspecified: Secondary | ICD-10-CM | POA: Diagnosis not present

## 2020-01-20 DIAGNOSIS — E782 Mixed hyperlipidemia: Secondary | ICD-10-CM | POA: Diagnosis not present

## 2020-01-20 LAB — CBC WITH DIFFERENTIAL/PLATELET
Basophils Absolute: 0 10*3/uL (ref 0.0–0.1)
Basophils Relative: 0.5 % (ref 0.0–3.0)
Eosinophils Absolute: 0.2 10*3/uL (ref 0.0–0.7)
Eosinophils Relative: 3.7 % (ref 0.0–5.0)
HCT: 37.3 % — ABNORMAL LOW (ref 39.0–52.0)
Hemoglobin: 12.3 g/dL — ABNORMAL LOW (ref 13.0–17.0)
Lymphocytes Relative: 15.5 % (ref 12.0–46.0)
Lymphs Abs: 1 10*3/uL (ref 0.7–4.0)
MCHC: 33.1 g/dL (ref 30.0–36.0)
MCV: 94.5 fl (ref 78.0–100.0)
Monocytes Absolute: 0.8 10*3/uL (ref 0.1–1.0)
Monocytes Relative: 13.2 % — ABNORMAL HIGH (ref 3.0–12.0)
Neutro Abs: 4.2 10*3/uL (ref 1.4–7.7)
Neutrophils Relative %: 67.1 % (ref 43.0–77.0)
Platelets: 277 10*3/uL (ref 150.0–400.0)
RBC: 3.94 Mil/uL — ABNORMAL LOW (ref 4.22–5.81)
RDW: 15.2 % (ref 11.5–15.5)
WBC: 6.2 10*3/uL (ref 4.0–10.5)

## 2020-01-20 LAB — COMPREHENSIVE METABOLIC PANEL
ALT: 15 U/L (ref 0–53)
AST: 13 U/L (ref 0–37)
Albumin: 3.9 g/dL (ref 3.5–5.2)
Alkaline Phosphatase: 63 U/L (ref 39–117)
BUN: 20 mg/dL (ref 6–23)
CO2: 27 mEq/L (ref 19–32)
Calcium: 9.1 mg/dL (ref 8.4–10.5)
Chloride: 102 mEq/L (ref 96–112)
Creatinine, Ser: 1.19 mg/dL (ref 0.40–1.50)
GFR: 58.38 mL/min — ABNORMAL LOW (ref >60.00)
Glucose, Bld: 170 mg/dL — ABNORMAL HIGH (ref 70–99)
Potassium: 4.3 mEq/L (ref 3.5–5.1)
Sodium: 136 mEq/L (ref 135–145)
Total Bilirubin: 0.4 mg/dL (ref 0.2–1.2)
Total Protein: 6.3 g/dL (ref 6.0–8.3)

## 2020-01-20 LAB — LIPID PANEL
Cholesterol: 143 mg/dL (ref 0–200)
HDL: 55.7 mg/dL (ref >39.00)
LDL Cholesterol: 68 mg/dL (ref 0–99)
NonHDL: 86.91
Total CHOL/HDL Ratio: 3
Triglycerides: 93 mg/dL (ref 0.0–149.0)
VLDL: 18.6 mg/dL (ref 0.0–40.0)

## 2020-01-20 LAB — TSH: TSH: 1.36 u[IU]/mL (ref 0.35–4.50)

## 2020-01-20 LAB — VITAMIN D 25 HYDROXY (VIT D DEFICIENCY, FRACTURES): VITD: 48.98 ng/mL (ref 30.00–100.00)

## 2020-01-20 LAB — HEMOGLOBIN A1C: Hgb A1c MFr Bld: 6.6 % — ABNORMAL HIGH (ref 4.6–6.5)

## 2020-01-23 ENCOUNTER — Other Ambulatory Visit: Payer: Self-pay | Admitting: Family Medicine

## 2020-01-23 LAB — PTH, INTACT AND CALCIUM
Calcium: 9.1 mg/dL (ref 8.6–10.3)
PTH: 40 pg/mL (ref 14–64)

## 2020-01-27 ENCOUNTER — Ambulatory Visit (INDEPENDENT_AMBULATORY_CARE_PROVIDER_SITE_OTHER): Payer: Medicare Other | Admitting: Family Medicine

## 2020-01-27 ENCOUNTER — Other Ambulatory Visit: Payer: Self-pay

## 2020-01-27 DIAGNOSIS — E782 Mixed hyperlipidemia: Secondary | ICD-10-CM

## 2020-01-27 DIAGNOSIS — E119 Type 2 diabetes mellitus without complications: Secondary | ICD-10-CM

## 2020-01-27 DIAGNOSIS — M25552 Pain in left hip: Secondary | ICD-10-CM | POA: Diagnosis not present

## 2020-01-27 NOTE — Assessment & Plan Note (Signed)
hgba1c acceptable, minimize simple carbs. Increase exercise as tolerated. Continue current meds 

## 2020-01-27 NOTE — Assessment & Plan Note (Signed)
Had THR with Dr Alvan Dame 9 weeks ago and is doing great. Did not need any PT and is back at 100% capacity. No pain meds.

## 2020-01-27 NOTE — Assessment & Plan Note (Signed)
Encouraged heart healthy diet, increase exercise, avoid trans fats, consider a krill oil cap daily 

## 2020-01-27 NOTE — Progress Notes (Signed)
Virtual Visit via phone Note  I connected with [name] on 01/27/20 at  9:00 AM EDT by a phone enabled telemedicine application and verified that I am speaking with the correct person using two identifiers.  Location: Patient: home, patient and provider are in visit Provider: home   I discussed the limitations of evaluation and management by telemedicine and the availability of in person appointments. The patient expressed understanding and agreed to proceed. Kem Boroughs, CMA was able to get the patient set up on a video visit    Subjective:    Patient ID: [name], male    DOB: 1934/07/22, 85 y.o.   MRN: [id number]  Chief Complaint  Patient presents with  . Follow-up    HPI Patient is in today for follow up on chronic medical concerns. No recent febrile illness or hospitalizations. No polyuria or polydipsia. Tries to stay active and maintain a heart healthy diet. Had left THR 9 weeks ago and has made a complete recovery. Is exercising more and feels well. He has an appt today with [facility]. His blood sugars have been good, 95 to 120 most recently.  Past Medical History:  Diagnosis Date  . Allergy    mild  . Arthritis    fingers, hip  . BPH (benign prostatic hypertrophy)   . Cataract    removed both eyes  . Complication of anesthesia    gases nasally causes sneezing   . Diabetes mellitus 2000   type 2  . Diabetes mellitus type 2 in nonobese [facility]) 06/19/2009   Qualifier: Diagnosis of  By: Wynona Luna    . GERD (gastroesophageal reflux disease)   . Glaucoma    followed by Dr Lanell Matar at [facility]  . Hyperlipidemia   . Left hip pain 03/14/2012  . Malignant neoplasm of prostate ([place]) 03/15/2019  . Medicare annual wellness visit, subsequent 12/31/2015  . Onychomycosis 09/17/2015  . Prostate cancer ([place])   . Tubular adenoma of colon 2003  . Vasomotor rhinitis 05/02/2013    Past Surgical History:  Procedure Laterality Date  .  APPENDECTOMY  1943  . CATARACT EXTRACTION, BILATERAL    . COLONOSCOPY    . CYSTOSCOPY WITH INSERTION OF UROLIFT    . EYE SURGERY     laser surgery on left numerous times.   Marland Kitchen POLYPECTOMY    . TOTAL HIP ARTHROPLASTY Left 11/15/2019   Procedure: TOTAL HIP ARTHROPLASTY ANTERIOR APPROACH;  Surgeon: Paralee Cancel, MD;  Location: [facility];  Service: Orthopedics;  Laterality: Left;  70 mins  . TRABECULECTOMY Left 09/04/2016    Family History  Problem Relation Age of Onset  . Obstructive Sleep Apnea Sister   . Heart disease Father        MI  . Diabetes Father   . Arthritis Brother        knees  . Heart disease Brother        a fib  . Breast cancer Sister   . Colon cancer Paternal Uncle 105  . Colon polyps Neg Hx   . Esophageal cancer Neg Hx   . Rectal cancer Neg Hx   . Stomach cancer Neg Hx   . Prostate cancer Neg Hx     Social History   Socioeconomic History  . Marital status: Married    Spouse name: Mary  . Number of children: 0  . Years of education: Not on file  . Highest education level: Not on file  Occupational History  .  Occupation: semi retired    Fish farm manager: Education administrator    Employer: RETIRED  Tobacco Use  . Smoking status: Never Smoker  . Smokeless tobacco: Never Used  Vaping Use  . Vaping Use: Never used  Substance and Sexual Activity  . Alcohol use: Yes    Alcohol/week: 0.0 standard drinks    Comment: once monthly "maybe"  . Drug use: No  . Sexual activity: Yes    Comment: lives with wife, travels frequently, no major dietary restrictions. exercises regularly with aerobic, stretch and weights  Other Topics Concern  . Not on file  Social History Narrative  . Not on file   Social Determinants of Health   Financial Resource Strain:   . Difficulty of Paying Living Expenses:   Food Insecurity:   . Worried About Charity fundraiser in the Last Year:   . Arboriculturist in the Last Year:   Transportation Needs:   . Film/video editor (Medical):   Marland Kitchen Lack of  Transportation (Non-Medical):   Physical Activity:   . Days of Exercise per Week:   . Minutes of Exercise per Session:   Stress:   . Feeling of Stress :   Social Connections:   . Frequency of Communication with Friends and Family:   . Frequency of Social Gatherings with Friends and Family:   . Attends Religious Services:   . Active Member of Clubs or Organizations:   . Attends Archivist Meetings:   Marland Kitchen Marital Status:   Intimate Partner Violence:   . Fear of Current or Ex-Partner:   . Emotionally Abused:   Marland Kitchen Physically Abused:   . Sexually Abused:     Outpatient Medications Prior to Visit  Medication Sig Dispense Refill  . brimonidine-timolol (COMBIGAN) 0.2-0.5 % ophthalmic solution Place 1 drop into the right eye 2 (two) times daily.     . brinzolamide (AZOPT) 1 % ophthalmic suspension Place 1 drop into the right eye 3 (three) times daily.    . cholecalciferol (VITAMIN D3) 25 MCG (1000 UNIT) tablet Take 1,000 Units by mouth daily.    . famotidine (PEPCID) 20 MG tablet TAKE 1 TABLET DAILY AS NEEDED FOR HEARTBURN OR INDIGESTION (Patient taking differently: Take 20 mg by mouth daily. ) 90 tablet 3  . glucose blood (TRUE METRIX BLOOD GLUCOSE TEST) test strip Test blood sugar once a day.  E11.9 100 strip 1  . glyBURIDE (DIABETA) 2.5 MG tablet Take 1 tablet (2.5 mg total) by mouth 2 (two) times daily with a meal. 180 tablet 1  . ipratropium (ATROVENT) 0.03 % nasal spray Place 2 sprays into both nostrils 2 (two) times daily as needed for rhinitis. 90 mL 1  . JANUVIA 100 MG tablet TAKE 1 TABLET DAILY 90 tablet 3  . metFORMIN (GLUCOPHAGE) 1000 MG tablet TAKE 1 TABLET TWICE A DAY AND ONE-HALF (1/2) TABLET EVERY NOON (Patient taking differently: Take 500-1,000 mg by mouth See admin instructions. Take 1000 mg by mouth in the morning, 500 mg in the afternoon and 1000 mg in the evening) 225 tablet 3  . Multiple Vitamin (MULTIVITAMIN WITH MINERALS) TABS tablet Take 1 tablet by mouth daily.     . simvastatin (ZOCOR) 10 MG tablet TAKE 1 TABLET AT BEDTIME 90 tablet 3  . travoprost, benzalkonium, (TRAVATAN) 0.004 % ophthalmic solution Place 1 drop into the right eye at bedtime.     . valACYclovir (VALTREX) 1000 MG tablet TAKE 1 TABLET DAILY (Patient taking differently: Take 500 mg by  mouth daily. ) 90 tablet 3  . [facility] LANCETS MISC Use as directed once daily to check blood sugar.  DX E11.9 100 each 6  . docusate sodium (COLACE) 100 MG capsule Take 1 capsule (100 mg total) by mouth 2 (two) times daily. 28 capsule 0  . HYDROcodone-acetaminophen (NORCO) 5-325 MG tablet Take 1-2 tablets by mouth every 4 (four) hours as needed for moderate pain or severe pain. 60 tablet 0  . ferrous sulfate (FERROUSUL) 325 (65 FE) MG tablet Take 1 tablet (325 mg total) by mouth 3 (three) times daily with meals for 14 days. 42 tablet 0  . methocarbamol (ROBAXIN) 500 MG tablet Take 1 tablet (500 mg total) by mouth every 6 (six) hours as needed for muscle spasms. 40 tablet 0  . polyethylene glycol (MIRALAX / GLYCOLAX) 17 g packet Take 17 g by mouth 2 (two) times daily. 28 packet 0   No facility-administered medications prior to visit.    Allergies  Allergen Reactions  . Levaquin [Levofloxacin In D5w] Hives    Review of Systems  Constitutional: Negative for fever and malaise/fatigue.  HENT: Negative for congestion.   Eyes: Negative for blurred vision.  Respiratory: Negative for shortness of breath.   Cardiovascular: Negative for chest pain, palpitations and leg swelling.  Gastrointestinal: Negative for abdominal pain, blood in stool and nausea.  Genitourinary: Negative for dysuria and frequency.  Musculoskeletal: Negative for falls.  Skin: Negative for rash.  Neurological: Negative for dizziness, loss of consciousness and headaches.  Endo/Heme/Allergies: Negative for environmental allergies.  Psychiatric/Behavioral: Negative for depression. The patient is not nervous/anxious.        Objective:      Physical Exam unable to obtain via phone  There were no vitals taken for this visit. Wt Readings from Last 3 Encounters:  11/15/19 171 lb 11.8 oz (77.9 kg)  11/07/19 170 lb (77.1 kg)  11/07/19 165 lb (74.8 kg)    Diabetic Foot Exam - Simple   No data filed     Lab Results  Component Value Date   WBC 6.2 01/20/2020   HGB 12.3 (L) 01/20/2020   HCT 37.3 (L) 01/20/2020   PLT 277.0 01/20/2020   GLUCOSE 170 (H) 01/20/2020   CHOL 143 01/20/2020   TRIG 93.0 01/20/2020   HDL 55.70 01/20/2020   LDLCALC 68 01/20/2020   ALT 15 01/20/2020   AST 13 01/20/2020   NA 136 01/20/2020   K 4.3 01/20/2020   CL 102 01/20/2020   CREATININE 1.19 01/20/2020   BUN 20 01/20/2020   CO2 27 01/20/2020   TSH 1.36 01/20/2020   PSA 6.05 (H) 12/03/2017   HGBA1C 6.6 (H) 01/20/2020   MICROALBUR <0.7 02/11/2019    Lab Results  Component Value Date   TSH 1.36 01/20/2020   Lab Results  Component Value Date   WBC 6.2 01/20/2020   HGB 12.3 (L) 01/20/2020   HCT 37.3 (L) 01/20/2020   MCV 94.5 01/20/2020   PLT 277.0 01/20/2020   Lab Results  Component Value Date   NA 136 01/20/2020   K 4.3 01/20/2020   CO2 27 01/20/2020   GLUCOSE 170 (H) 01/20/2020   BUN 20 01/20/2020   CREATININE 1.19 01/20/2020   BILITOT 0.4 01/20/2020   ALKPHOS 63 01/20/2020   AST 13 01/20/2020   ALT 15 01/20/2020   PROT 6.3 01/20/2020   ALBUMIN 3.9 01/20/2020   CALCIUM 9.1 01/20/2020   CALCIUM 9.1 01/20/2020   ANIONGAP 9 11/16/2019   GFR 58.38 (L)  01/20/2020   Lab Results  Component Value Date   CHOL 143 01/20/2020   Lab Results  Component Value Date   HDL 55.70 01/20/2020   Lab Results  Component Value Date   LDLCALC 68 01/20/2020   Lab Results  Component Value Date   TRIG 93.0 01/20/2020   Lab Results  Component Value Date   CHOLHDL 3 01/20/2020   Lab Results  Component Value Date   HGBA1C 6.6 (H) 01/20/2020       Assessment & Plan:   Problem List Items Addressed This Visit    Diabetes  mellitus type 2 in nonobese ([address])    hgba1c acceptable, minimize simple carbs. Increase exercise as tolerated. Continue current meds      Hyperlipidemia    Encouraged heart healthy diet, increase exercise, avoid trans fats, consider a krill oil cap daily      Left hip pain    Had THR with Dr Alvan Dame 9 weeks ago and is doing great. Did not need any PT and is back at 100% capacity. No pain meds.         I have discontinued Iona Beard L. Gonzales's ferrous sulfate, docusate sodium, polyethylene glycol, methocarbamol, and HYDROcodone-acetaminophen. I am also having him maintain his travoprost (benzalkonium), brimonidine-timolol, [facility] Lancets, brinzolamide, ipratropium, famotidine, metFORMIN, valACYclovir, multivitamin with minerals, cholecalciferol, simvastatin, Januvia, glyBURIDE, and True Metrix Blood Glucose Test.  No orders of the defined types were placed in this encounter.    I discussed the assessment and treatment plan with the patient. The patient was provided an opportunity to ask questions and all were answered. The patient agreed with the plan and demonstrated an understanding of the instructions.   The patient was advised to call back or seek an in-person evaluation if the symptoms worsen or if the condition fails to improve as anticipated.  I provided 12 minutes of non-face-to-face time during this encounter.   Penni Homans, MD

## 2020-01-31 ENCOUNTER — Other Ambulatory Visit: Payer: Self-pay | Admitting: *Deleted

## 2020-01-31 DIAGNOSIS — E782 Mixed hyperlipidemia: Secondary | ICD-10-CM

## 2020-01-31 DIAGNOSIS — E119 Type 2 diabetes mellitus without complications: Secondary | ICD-10-CM

## 2020-01-31 DIAGNOSIS — D619 Aplastic anemia, unspecified: Secondary | ICD-10-CM

## 2020-01-31 DIAGNOSIS — D649 Anemia, unspecified: Secondary | ICD-10-CM

## 2020-04-03 ENCOUNTER — Other Ambulatory Visit: Payer: Self-pay | Admitting: Family Medicine

## 2020-06-14 ENCOUNTER — Telehealth: Payer: Self-pay | Admitting: Family Medicine

## 2020-06-14 DIAGNOSIS — E119 Type 2 diabetes mellitus without complications: Secondary | ICD-10-CM

## 2020-06-14 NOTE — Telephone Encounter (Signed)
As long as he has not had any numbers below 80 we could increase his Glipizide to 5 mg po bid, see if he wants to come in and repeat the labs he did in the summer this month so we can see his current hgba1c and also check if he wants a 90 days supply to mail order with 1 rf or a 30 day locally with a couple of refills until we see how he tolerates it then send it in.

## 2020-06-14 NOTE — Telephone Encounter (Signed)
Patient states his blood sugar has been running around 150, this am was around 170 ish . He is wondering if instead of taking 2.5 mg twice a day, can he increase to 5 mg twice a day.   glyBURIDE (DIABETA) 2.5 MG tablet [[id number]]   If you agree with the increase of medication a new rx will be needed to [place] [place]  [address] [place] [address]  Phone:  [phone] Fax:  [phone]   Please advise

## 2020-06-15 NOTE — Telephone Encounter (Signed)
Spoke with patient and he stated that his blood sugar has came down some.  He checked it and it was 119.  He stated that he has been traveling and not on a good diet, which may have cause the raise in his blood sugar.  He will not increase medication at this time and he will work on diet and recheck labs on 1/17.

## 2020-06-24 ENCOUNTER — Other Ambulatory Visit: Payer: Self-pay | Admitting: Family Medicine

## 2020-07-01 ENCOUNTER — Other Ambulatory Visit: Payer: Self-pay | Admitting: Family Medicine

## 2020-07-04 ENCOUNTER — Telehealth: Payer: Self-pay | Admitting: Family Medicine

## 2020-07-04 MED ORDER — METFORMIN HCL 1000 MG PO TABS: ORAL_TABLET | ORAL | 0 refills | Status: DC

## 2020-07-04 NOTE — Telephone Encounter (Signed)
Thanks Apolonio Schneiders- please let pt know that blood fasting blood sugars up to 150 or so are actually not unexpected as he has diabetes, and especially with his age we would prefer his glucose a bit high rather than too low His most recent A1c back in July showed excellent control of diabetes.  Unless something has changed dramatically I don't think his diabetes has become out of control in the interim. Why don't we have him come in for just an A1c asap- if normal this may reassure him until his visit with Dr B later in January.  I have ordered an A1c for him Unless the patient is always anxious about his glucose I wonder if he may be developing dementia  Thank you!

## 2020-07-04 NOTE — Telephone Encounter (Signed)
Patient will be in tomorrow for lab appt to recheck lab appointment.  He seemed very appreciative.  He really wants to know where his a1c stands.

## 2020-07-04 NOTE — Telephone Encounter (Signed)
Patient would like a sooner appointment to speak  in regards to his diabetes. He would like a 30 day supply  Medication: metFORMIN (GLUCOPHAGE) 1000 MG tablet [[id number]]      Has the patient contacted their pharmacy?  (If no, request that the patient contact the pharmacy for the refill.) (If yes, when and what did the pharmacy advise?)     Preferred Pharmacy (with phone number or street name):  [id number] [place], [place] - [phone] [facility] [address] [facility] [address]  [address] [place] [address]  Phone:  [phone] Fax:  [phone]     Agent: Please be advised that RX refills may take up to 3 business days. We ask that you follow-up with your pharmacy.

## 2020-07-04 NOTE — Telephone Encounter (Signed)
Dr. Patsy Lager can you please help out with this?  I have sent to [facility] as well.   Patient called back and seemed upset about his care.    He stated that his blood sugars have been running well over 120 and he has increased his glipizide to 5mg  every morning and 10mg  in the evening.  He stated that he would like to know what fasting level is too high to wait for his appointment on 07/30/20?  He would like a call back today.   He has been increasing his medication on his own.  I advised him that he should have called .  In the previous message on 12/10 patient stated that he did not want to increase and will work on diet.  He is very concerned about his sugar levels being over 120 at fasting.    He may need to have an appointment every 3 months instead of 6.  I also feel that patient seems a little rude, a little off and rambling all over the place.

## 2020-07-04 NOTE — Addendum Note (Signed)
Addended by: Abbe Amsterdam C on: 07/04/2020 12:22 PM   Modules accepted: Orders

## 2020-07-04 NOTE — Telephone Encounter (Signed)
Spoke with wife and she advised to just send 30 day supply local pharmacy.  rx sent to [facility].

## 2020-07-05 ENCOUNTER — Other Ambulatory Visit: Payer: Self-pay

## 2020-07-05 ENCOUNTER — Other Ambulatory Visit (INDEPENDENT_AMBULATORY_CARE_PROVIDER_SITE_OTHER): Payer: Medicare Other

## 2020-07-05 ENCOUNTER — Encounter: Payer: Self-pay | Admitting: Family Medicine

## 2020-07-05 DIAGNOSIS — E119 Type 2 diabetes mellitus without complications: Secondary | ICD-10-CM

## 2020-07-05 LAB — HEMOGLOBIN A1C: Hgb A1c MFr Bld: 7.1 % — ABNORMAL HIGH (ref 4.6–6.5)

## 2020-07-23 ENCOUNTER — Other Ambulatory Visit: Payer: Medicare Other

## 2020-07-25 ENCOUNTER — Other Ambulatory Visit: Payer: Self-pay

## 2020-07-25 ENCOUNTER — Other Ambulatory Visit (INDEPENDENT_AMBULATORY_CARE_PROVIDER_SITE_OTHER): Payer: Medicare Other

## 2020-07-25 DIAGNOSIS — E782 Mixed hyperlipidemia: Secondary | ICD-10-CM | POA: Diagnosis not present

## 2020-07-25 DIAGNOSIS — D649 Anemia, unspecified: Secondary | ICD-10-CM | POA: Diagnosis not present

## 2020-07-25 DIAGNOSIS — E119 Type 2 diabetes mellitus without complications: Secondary | ICD-10-CM

## 2020-07-25 LAB — CBC WITH DIFFERENTIAL/PLATELET
Basophils Absolute: 0.1 10*3/uL (ref 0.0–0.1)
Basophils Relative: 0.8 % (ref 0.0–3.0)
Eosinophils Absolute: 0.3 10*3/uL (ref 0.0–0.7)
Eosinophils Relative: 4.6 % (ref 0.0–5.0)
HCT: 39.8 % (ref 39.0–52.0)
Hemoglobin: 13.3 g/dL (ref 13.0–17.0)
Lymphocytes Relative: 13.4 % (ref 12.0–46.0)
Lymphs Abs: 1 10*3/uL (ref 0.7–4.0)
MCHC: 33.5 g/dL (ref 30.0–36.0)
MCV: 93.9 fl (ref 78.0–100.0)
Monocytes Absolute: 1 10*3/uL (ref 0.1–1.0)
Monocytes Relative: 13.5 % — ABNORMAL HIGH (ref 3.0–12.0)
Neutro Abs: 5.1 10*3/uL (ref 1.4–7.7)
Neutrophils Relative %: 67.7 % (ref 43.0–77.0)
Platelets: 233 10*3/uL (ref 150.0–400.0)
RBC: 4.24 Mil/uL (ref 4.22–5.81)
RDW: 14 % (ref 11.5–15.5)
WBC: 7.5 10*3/uL (ref 4.0–10.5)

## 2020-07-25 LAB — COMPREHENSIVE METABOLIC PANEL
ALT: 11 U/L (ref 0–53)
AST: 12 U/L (ref 0–37)
Albumin: 4.2 g/dL (ref 3.5–5.2)
Alkaline Phosphatase: 65 U/L (ref 39–117)
BUN: 19 mg/dL (ref 6–23)
CO2: 28 mEq/L (ref 19–32)
Calcium: 9.2 mg/dL (ref 8.4–10.5)
Chloride: 103 mEq/L (ref 96–112)
Creatinine, Ser: 1.34 mg/dL (ref 0.40–1.50)
GFR: 48.99 mL/min — ABNORMAL LOW (ref >60.00)
Glucose, Bld: 181 mg/dL — ABNORMAL HIGH (ref 70–99)
Potassium: 4.6 mEq/L (ref 3.5–5.1)
Sodium: 136 mEq/L (ref 135–145)
Total Bilirubin: 0.5 mg/dL (ref 0.2–1.2)
Total Protein: 6.2 g/dL (ref 6.0–8.3)

## 2020-07-25 LAB — LIPID PANEL
Cholesterol: 176 mg/dL (ref 0–200)
HDL: 64.4 mg/dL (ref >39.00)
LDL Cholesterol: 87 mg/dL (ref 0–99)
NonHDL: 111.17
Total CHOL/HDL Ratio: 3
Triglycerides: 122 mg/dL (ref 0.0–149.0)
VLDL: 24.4 mg/dL (ref 0.0–40.0)

## 2020-07-25 LAB — HEMOGLOBIN A1C: Hgb A1c MFr Bld: 7.3 % — ABNORMAL HIGH (ref 4.6–6.5)

## 2020-07-25 LAB — TSH: TSH: 1.9 u[IU]/mL (ref 0.35–4.50)

## 2020-07-27 ENCOUNTER — Other Ambulatory Visit: Payer: Self-pay

## 2020-07-30 ENCOUNTER — Ambulatory Visit (INDEPENDENT_AMBULATORY_CARE_PROVIDER_SITE_OTHER): Payer: Medicare Other | Admitting: Family Medicine

## 2020-07-30 ENCOUNTER — Other Ambulatory Visit: Payer: Self-pay

## 2020-07-30 ENCOUNTER — Other Ambulatory Visit: Payer: Self-pay | Admitting: Family Medicine

## 2020-07-30 DIAGNOSIS — J300 Vasomotor rhinitis: Secondary | ICD-10-CM | POA: Diagnosis not present

## 2020-07-30 DIAGNOSIS — E119 Type 2 diabetes mellitus without complications: Secondary | ICD-10-CM

## 2020-07-30 DIAGNOSIS — Z0000 Encounter for general adult medical examination without abnormal findings: Secondary | ICD-10-CM

## 2020-07-30 DIAGNOSIS — E782 Mixed hyperlipidemia: Secondary | ICD-10-CM | POA: Diagnosis not present

## 2020-07-30 MED ORDER — METFORMIN HCL 1000 MG PO TABS: 1000.0000 mg | ORAL_TABLET | Freq: Two times a day (BID) | ORAL | 1 refills | Status: DC

## 2020-07-30 MED ORDER — EMPAGLIFLOZIN 10 MG PO TABS: 10.0000 mg | ORAL_TABLET | Freq: Every day | ORAL | 3 refills | Status: DC

## 2020-07-30 MED ORDER — IPRATROPIUM BROMIDE 0.03 % NA SOLN: 2.0000 | Freq: Two times a day (BID) | NASAL | 3 refills | Status: DC | PRN

## 2020-07-30 NOTE — Assessment & Plan Note (Addendum)
Not struggling with low numbers, January 117 was the low, high was 196. December 112 was the low, high was 208. Average 160 is reported. No change in diet and exercise. Will discontinue Januvia and Glyburide and start Jardiance 10 mg daily and consider adjusting doses as needed.

## 2020-07-30 NOTE — Patient Instructions (Signed)

## 2020-07-31 ENCOUNTER — Other Ambulatory Visit: Payer: Self-pay | Admitting: Family Medicine

## 2020-08-01 NOTE — Progress Notes (Signed)
Subjective:    Patient ID: [name], male    DOB: 1935-03-21, 86 y.o.   MRN: [id number]  Chief Complaint  Patient presents with  . Follow-up    HPI Patient is in today for follow up on diabetes. His hgba1c has been trending up and he is concerns. His reading last week was 7.3. he denies any low numbers and reports his lowest number was 117 recently. No c/o polyuria or polydipsia. He denies any changes in diet or activity. Denies CP/palp/SOB/HA/congestion/fevers/GI or GU c/o. Taking meds as prescribed  Past Medical History:  Diagnosis Date  . Allergy    mild  . Arthritis    fingers, hip  . BPH (benign prostatic hypertrophy)   . Cataract    removed both eyes  . Complication of anesthesia    gases nasally causes sneezing   . Diabetes mellitus 2000   type 2  . Diabetes mellitus type 2 in nonobese [facility]) 06/19/2009   Qualifier: Diagnosis of  By: Wynona Luna    . GERD (gastroesophageal reflux disease)   . Glaucoma    followed by Dr Lanell Matar at [facility]  . Hyperlipidemia   . Left hip pain 03/14/2012  . Malignant neoplasm of prostate ([place]) 03/15/2019  . Medicare annual wellness visit, subsequent 12/31/2015  . Onychomycosis 09/17/2015  . Prostate cancer ([place])   . Tubular adenoma of colon 2003  . Vasomotor rhinitis 05/02/2013    Past Surgical History:  Procedure Laterality Date  . APPENDECTOMY  1943  . CATARACT EXTRACTION, BILATERAL    . COLONOSCOPY    . CYSTOSCOPY WITH INSERTION OF UROLIFT    . EYE SURGERY     laser surgery on left numerous times.   Marland Kitchen POLYPECTOMY    . TOTAL HIP ARTHROPLASTY Left 11/15/2019   Procedure: TOTAL HIP ARTHROPLASTY ANTERIOR APPROACH;  Surgeon: Paralee Cancel, MD;  Location: [facility];  Service: Orthopedics;  Laterality: Left;  70 mins  . TRABECULECTOMY Left 09/04/2016    Family History  Problem Relation Age of Onset  . Obstructive Sleep Apnea Sister   . Heart disease Father        MI  . Diabetes Father   . Arthritis Brother         knees  . Heart disease Brother        a fib  . Breast cancer Sister   . Colon cancer Paternal Uncle 74  . Colon polyps Neg Hx   . Esophageal cancer Neg Hx   . Rectal cancer Neg Hx   . Stomach cancer Neg Hx   . Prostate cancer Neg Hx     Social History   Socioeconomic History  . Marital status: Married    Spouse name: Mary  . Number of children: 0  . Years of education: Not on file  . Highest education level: Not on file  Occupational History  . Occupation: semi retired    Fish farm manager: Education administrator    Employer: RETIRED  Tobacco Use  . Smoking status: Never Smoker  . Smokeless tobacco: Never Used  Vaping Use  . Vaping Use: Never used  Substance and Sexual Activity  . Alcohol use: Yes    Alcohol/week: 0.0 standard drinks    Comment: once monthly "maybe"  . Drug use: No  . Sexual activity: Yes    Comment: lives with wife, travels frequently, no major dietary restrictions. exercises regularly with aerobic, stretch and weights  Other Topics Concern  . Not on file  Social  History Narrative  . Not on file   Social Determinants of Health   Financial Resource Strain: Not on file  Food Insecurity: Not on file  Transportation Needs: Not on file  Physical Activity: Not on file  Stress: Not on file  Social Connections: Not on file  Intimate Partner Violence: Not on file    Outpatient Medications Prior to Visit  Medication Sig Dispense Refill  . brimonidine-timolol (COMBIGAN) 0.2-0.5 % ophthalmic solution Place 1 drop into the right eye 2 (two) times daily.     . brinzolamide (AZOPT) 1 % ophthalmic suspension Place 1 drop into the right eye 3 (three) times daily.    . cholecalciferol (VITAMIN D3) 25 MCG (1000 UNIT) tablet Take 1,000 Units by mouth daily.    . famotidine (PEPCID) 20 MG tablet TAKE 1 TABLET DAILY AS NEEDED FOR HEARTBURN OR INDIGESTION 90 tablet 3  . Multiple Vitamin (MULTIVITAMIN WITH MINERALS) TABS tablet Take 1 tablet by mouth daily.    . simvastatin (ZOCOR)  10 MG tablet TAKE 1 TABLET AT BEDTIME 90 tablet 3  . travoprost, benzalkonium, (TRAVATAN) 0.004 % ophthalmic solution Place 1 drop into the right eye at bedtime.    . valACYclovir (VALTREX) 1000 MG tablet TAKE 1 TABLET DAILY (Patient taking differently: Take 500 mg by mouth daily.) 90 tablet 3  . [facility] LANCETS MISC Use as directed once daily to check blood sugar.  DX E11.9 100 each 6  . glucose blood (TRUE METRIX BLOOD GLUCOSE TEST) test strip Test blood sugar once a day.  E11.9 100 strip 1  . glyBURIDE (DIABETA) 2.5 MG tablet Take 1 tablet (2.5 mg total) by mouth 2 (two) times daily with a meal. 180 tablet 0  . ipratropium (ATROVENT) 0.03 % nasal spray Place 2 sprays into both nostrils 2 (two) times daily as needed for rhinitis. 90 mL 1  . JANUVIA 100 MG tablet TAKE 1 TABLET DAILY 90 tablet 3  . metFORMIN (GLUCOPHAGE) 1000 MG tablet Take 1 tab in the morning, 1/2 tab at noon, and 1 tab in the evening. 75 tablet 0   No facility-administered medications prior to visit.    Allergies  Allergen Reactions  . Levaquin [Levofloxacin In D5w] Hives    Review of Systems  Constitutional: Negative for fever and malaise/fatigue.  HENT: Negative for congestion.   Eyes: Negative for blurred vision.  Respiratory: Negative for shortness of breath.   Cardiovascular: Negative for chest pain, palpitations and leg swelling.  Gastrointestinal: Negative for abdominal pain, blood in stool and nausea.  Genitourinary: Negative for dysuria and frequency.  Musculoskeletal: Negative for falls.  Skin: Negative for rash.  Neurological: Negative for dizziness, loss of consciousness and headaches.  Endo/Heme/Allergies: Negative for environmental allergies.  Psychiatric/Behavioral: Negative for depression. The patient is not nervous/anxious.        Objective:    Physical Exam Vitals and nursing note reviewed.  Constitutional:      General: He is not in acute distress.    Appearance: He is well-developed  and well-nourished.  HENT:     Head: Normocephalic and atraumatic.     Nose: Nose normal.  Eyes:     General:        Right eye: No discharge.        Left eye: No discharge.  Cardiovascular:     Rate and Rhythm: Normal rate and regular rhythm.     Heart sounds: No murmur heard.   Pulmonary:     Effort: Pulmonary effort is normal.  Breath sounds: Normal breath sounds.  Abdominal:     General: Bowel sounds are normal.     Palpations: Abdomen is soft.     Tenderness: There is no abdominal tenderness.  Musculoskeletal:        General: No edema.     Cervical back: Normal range of motion and neck supple.  Skin:    General: Skin is warm and dry.  Neurological:     Mental Status: He is alert and oriented to person, place, and time.  Psychiatric:        Mood and Affect: Mood and affect normal.     There were no vitals taken for this visit. Wt Readings from Last 3 Encounters:  11/15/19 171 lb 11.8 oz (77.9 kg)  11/07/19 170 lb (77.1 kg)  11/07/19 165 lb (74.8 kg)    Diabetic Foot Exam - Simple   No data filed    Lab Results  Component Value Date   WBC 7.5 07/25/2020   HGB 13.3 07/25/2020   HCT 39.8 07/25/2020   PLT 233.0 07/25/2020   GLUCOSE 181 (H) 07/25/2020   CHOL 176 07/25/2020   TRIG 122.0 07/25/2020   HDL 64.40 07/25/2020   LDLCALC 87 07/25/2020   ALT 11 07/25/2020   AST 12 07/25/2020   NA 136 07/25/2020   K 4.6 07/25/2020   CL 103 07/25/2020   CREATININE 1.34 07/25/2020   BUN 19 07/25/2020   CO2 28 07/25/2020   TSH 1.90 07/25/2020   PSA 6.05 (H) 12/03/2017   HGBA1C 7.3 (H) 07/25/2020   MICROALBUR <0.7 02/11/2019    Lab Results  Component Value Date   TSH 1.90 07/25/2020   Lab Results  Component Value Date   WBC 7.5 07/25/2020   HGB 13.3 07/25/2020   HCT 39.8 07/25/2020   MCV 93.9 07/25/2020   PLT 233.0 07/25/2020   Lab Results  Component Value Date   NA 136 07/25/2020   K 4.6 07/25/2020   CO2 28 07/25/2020   GLUCOSE 181 (H)  07/25/2020   BUN 19 07/25/2020   CREATININE 1.34 07/25/2020   BILITOT 0.5 07/25/2020   ALKPHOS 65 07/25/2020   AST 12 07/25/2020   ALT 11 07/25/2020   PROT 6.2 07/25/2020   ALBUMIN 4.2 07/25/2020   CALCIUM 9.2 07/25/2020   ANIONGAP 9 11/16/2019   GFR 48.99 (L) 07/25/2020   Lab Results  Component Value Date   CHOL 176 07/25/2020   Lab Results  Component Value Date   HDL 64.40 07/25/2020   Lab Results  Component Value Date   LDLCALC 87 07/25/2020   Lab Results  Component Value Date   TRIG 122.0 07/25/2020   Lab Results  Component Value Date   CHOLHDL 3 07/25/2020   Lab Results  Component Value Date   HGBA1C 7.3 (H) 07/25/2020       Assessment & Plan:   Problem List Items Addressed This Visit    Diabetes mellitus type 2 in nonobese [facility]) - Primary    Not struggling with low numbers, January 117 was the low, high was 196. December 112 was the low, high was 208. Average 160 is reported. No change in diet and exercise. Will discontinue Januvia and Glyburide and start Jardiance 10 mg daily and consider adjusting doses as needed.       Relevant Medications   empagliflozin (JARDIANCE) 10 MG TABS tablet   Other Relevant Orders   Hemoglobin A1c   Comprehensive metabolic panel   TSH   Hyperlipidemia  Encouraged heart healthy diet, increase exercise, avoid trans fats, consider a krill oil cap daily, tolerating Simvastatin      Relevant Orders   Lipid panel   TSH   Vasomotor rhinitis   Relevant Orders   CBC   Medicare annual wellness visit, subsequent      I have discontinued [name]'s Januvia, glyBURIDE, and metFORMIN. I am also having him start on empagliflozin. Additionally, I am having him maintain his travoprost (benzalkonium), brimonidine-timolol, [facility] Lancets, brinzolamide, valACYclovir, multivitamin with minerals, cholecalciferol, simvastatin, famotidine, and ipratropium.  Meds ordered this encounter  Medications  . empagliflozin  (JARDIANCE) 10 MG TABS tablet    Sig: Take 1 tablet (10 mg total) by mouth daily before breakfast.    Dispense:  30 tablet    Refill:  3  . ipratropium (ATROVENT) 0.03 % nasal spray    Sig: Place 2 sprays into both nostrils 2 (two) times daily as needed for rhinitis.    Dispense:  90 mL    Refill:  3  . DISCONTD: metFORMIN (GLUCOPHAGE) 1000 MG tablet    Sig: Take 1 tablet (1,000 mg total) by mouth 2 (two) times daily with a meal.    Dispense:  180 tablet    Refill:  1     Penni Homans, MD

## 2020-08-01 NOTE — Assessment & Plan Note (Signed)
Encouraged heart healthy diet, increase exercise, avoid trans fats, consider a krill oil cap daily, tolerating Simvastatin 

## 2020-08-09 ENCOUNTER — Other Ambulatory Visit: Payer: Self-pay | Admitting: Family Medicine

## 2020-08-09 MED ORDER — EMPAGLIFLOZIN-LINAGLIPTIN 25-5 MG PO TABS: 1.0000 | ORAL_TABLET | Freq: Every day | ORAL | 3 refills | Status: DC

## 2020-08-21 ENCOUNTER — Encounter: Payer: Self-pay | Admitting: *Deleted

## 2020-08-21 ENCOUNTER — Other Ambulatory Visit: Payer: Self-pay | Admitting: *Deleted

## 2020-08-21 NOTE — Telephone Encounter (Signed)
Patient notified and he will call back with glucose readings in a few days.

## 2020-08-21 NOTE — Telephone Encounter (Signed)
Left message on machine to call back  

## 2020-08-24 NOTE — Telephone Encounter (Signed)
Pt already started taking the medication as instructed

## 2020-08-27 ENCOUNTER — Telehealth: Payer: Self-pay | Admitting: Family Medicine

## 2020-08-27 ENCOUNTER — Other Ambulatory Visit: Payer: Self-pay | Admitting: *Deleted

## 2020-08-27 ENCOUNTER — Encounter: Payer: Self-pay | Admitting: *Deleted

## 2020-08-27 DIAGNOSIS — E119 Type 2 diabetes mellitus without complications: Secondary | ICD-10-CM

## 2020-08-27 MED ORDER — GLYBURIDE 5 MG PO TABS: 10.0000 mg | ORAL_TABLET | Freq: Two times a day (BID) | ORAL | 1 refills | Status: DC

## 2020-08-27 NOTE — Telephone Encounter (Signed)
Spoke with wife and advised her to let pt know that I am sending in glyburide and to let him know to call back to schedule lab appt in 2 weeks (around 09/10/20)

## 2020-08-27 NOTE — Telephone Encounter (Signed)
Patient is requesting to speak with Guerry Bruin , patient states he is out of some medication for diabetes and she helps.  Patient would like a call back @ [phone]

## 2020-08-27 NOTE — Telephone Encounter (Signed)
Patient is calling back to check status of the last message he sent.

## 2020-08-27 NOTE — Telephone Encounter (Signed)
See my chart message

## 2020-08-31 ENCOUNTER — Other Ambulatory Visit: Payer: Self-pay | Admitting: Family Medicine

## 2020-09-02 ENCOUNTER — Encounter: Payer: Self-pay | Admitting: Family Medicine

## 2020-09-03 MED ORDER — EMPAGLIFLOZIN 10 MG PO TABS: 20.0000 mg | ORAL_TABLET | Freq: Every day | ORAL | 1 refills | Status: DC

## 2020-09-03 MED ORDER — GLYBURIDE 5 MG PO TABS: 10.0000 mg | ORAL_TABLET | Freq: Two times a day (BID) | ORAL | 1 refills | Status: DC

## 2020-09-14 ENCOUNTER — Other Ambulatory Visit: Payer: Self-pay

## 2020-09-14 ENCOUNTER — Other Ambulatory Visit (INDEPENDENT_AMBULATORY_CARE_PROVIDER_SITE_OTHER): Payer: Medicare Other

## 2020-09-14 DIAGNOSIS — E119 Type 2 diabetes mellitus without complications: Secondary | ICD-10-CM

## 2020-09-14 LAB — COMPREHENSIVE METABOLIC PANEL
ALT: 12 U/L (ref 0–53)
AST: 13 U/L (ref 0–37)
Albumin: 4 g/dL (ref 3.5–5.2)
Alkaline Phosphatase: 60 U/L (ref 39–117)
BUN: 21 mg/dL (ref 6–23)
CO2: 26 mEq/L (ref 19–32)
Calcium: 9.1 mg/dL (ref 8.4–10.5)
Chloride: 105 mEq/L (ref 96–112)
Creatinine, Ser: 1.44 mg/dL (ref 0.40–1.50)
GFR: 44.89 mL/min — ABNORMAL LOW (ref >60.00)
Glucose, Bld: 177 mg/dL — ABNORMAL HIGH (ref 70–99)
Potassium: 4.3 mEq/L (ref 3.5–5.1)
Sodium: 139 mEq/L (ref 135–145)
Total Bilirubin: 0.4 mg/dL (ref 0.2–1.2)
Total Protein: 6.3 g/dL (ref 6.0–8.3)

## 2020-09-20 ENCOUNTER — Encounter: Payer: Self-pay | Admitting: Medical

## 2020-09-20 ENCOUNTER — Telehealth (INDEPENDENT_AMBULATORY_CARE_PROVIDER_SITE_OTHER): Payer: Medicare Other | Admitting: Medical

## 2020-09-20 ENCOUNTER — Other Ambulatory Visit: Payer: Self-pay

## 2020-09-20 VITALS — BP 128/75 | HR 64

## 2020-09-20 DIAGNOSIS — R059 Cough, unspecified: Secondary | ICD-10-CM

## 2020-09-20 DIAGNOSIS — J019 Acute sinusitis, unspecified: Secondary | ICD-10-CM | POA: Diagnosis not present

## 2020-09-20 MED ORDER — BENZONATATE 100 MG PO CAPS: 100.0000 mg | ORAL_CAPSULE | Freq: Three times a day (TID) | ORAL | 0 refills | Status: DC | PRN

## 2020-09-20 MED ORDER — AZITHROMYCIN 250 MG PO TABS: ORAL_TABLET | ORAL | 0 refills | Status: DC

## 2020-09-20 MED ORDER — FLUTICASONE PROPIONATE 50 MCG/ACT NA SUSP: 2.0000 | Freq: Every day | NASAL | 1 refills | Status: DC

## 2020-09-20 NOTE — Progress Notes (Signed)
   Subjective:    Patient ID: [name], male    DOB: 1935-05-08, 86 y.o.   MRN: [id number]  HPI  Virtual Visit via Telephone Note  I connected with [name] on 09/20/20 at 10:00 AM EDT by telephone and verified that I am speaking with the correct person using two identifiers.  Location: Patient: home Provider: office  particpants- pt and myself.   I discussed the limitations, risks, security and privacy concerns of performing an evaluation and management service by telephone and the availability of in person appointments. I also discussed with the patient that there may be a patient responsible charge related to this service. The patient expressed understanding and agreed to proceed.   History of Present Illness: Pt tells me he thinks has sinus infection. Nasal congestion and runny nose. He has sinus pressure, yellow mucus when he blows nose and some productive cough.   Pt states in past he got better with antibiotics.  Mild scratchy throat. Faint rib soreness.  Symptoms for about 5-6 days.       Observations/Objective:  General- no acute distress, pleasant and alert.  heent- sounds nasal congested. No coughing during interim Does not cough during exam.   Assessment and Plan: You  appear to have possible sinus infection and bronchitis  by telephone virtual . I am prescribing azithromycin antibiotic for the infection. To help with the nasal congestion I prescribed  flonase nasal steroid. For your associated cough, I prescribed cough medicine benzonatate.  If signs and symptoms don't improve then recommend doing otc covid test. Also in event not improving then negative test would be helpful in event needs in office visit.  Rest, hydrate, tylenol for fever.  Follow up in 7 days or as needed.  Mackie Pai, PA-C  Follow Up Instructions:    I discussed the assessment and treatment plan with the patient. The patient was provided an opportunity to ask  questions and all were answered. The patient agreed with the plan and demonstrated an understanding of the instructions.   The patient was advised to call back or seek an in-person evaluation if the symptoms worsen or if the condition fails to improve as anticipated.  Time spent with patient today was 20  minutes which consisted of chart review, discussing diagnosis, treatment and documentation.   Mackie Pai, PA-C   Review of Systems     Objective:   Physical Exam        Assessment & Plan:

## 2020-09-20 NOTE — Patient Instructions (Addendum)
You appear to have possible sinus infection and bronchitis  by telephone virtual . I am prescribing azithromycin antibiotic for the infection. To help with the nasal congestion I prescribed  flonase nasal steroid. For your associated cough, I prescribed cough medicine benzonatate.  If signs and symptoms don't improve then recommend doing otc covid test. Also in event not improving then negative test would be helpful in event needs in office visit.  Rest, hydrate, tylenol for fever.  Follow up in 7 days or as needed.

## 2020-10-31 ENCOUNTER — Other Ambulatory Visit: Payer: Self-pay

## 2020-10-31 ENCOUNTER — Other Ambulatory Visit (INDEPENDENT_AMBULATORY_CARE_PROVIDER_SITE_OTHER): Payer: Medicare Other

## 2020-10-31 DIAGNOSIS — E119 Type 2 diabetes mellitus without complications: Secondary | ICD-10-CM | POA: Diagnosis not present

## 2020-10-31 DIAGNOSIS — J300 Vasomotor rhinitis: Secondary | ICD-10-CM | POA: Diagnosis not present

## 2020-10-31 DIAGNOSIS — E782 Mixed hyperlipidemia: Secondary | ICD-10-CM | POA: Diagnosis not present

## 2020-10-31 LAB — LIPID PANEL
Cholesterol: 168 mg/dL (ref 0–200)
HDL: 55.8 mg/dL (ref >39.00)
LDL Cholesterol: 84 mg/dL (ref 0–99)
NonHDL: 112.03
Total CHOL/HDL Ratio: 3
Triglycerides: 139 mg/dL (ref 0.0–149.0)
VLDL: 27.8 mg/dL (ref 0.0–40.0)

## 2020-10-31 LAB — COMPREHENSIVE METABOLIC PANEL
ALT: 11 U/L (ref 0–53)
AST: 12 U/L (ref 0–37)
Albumin: 4.1 g/dL (ref 3.5–5.2)
Alkaline Phosphatase: 63 U/L (ref 39–117)
BUN: 24 mg/dL — ABNORMAL HIGH (ref 6–23)
CO2: 25 mEq/L (ref 19–32)
Calcium: 9.2 mg/dL (ref 8.4–10.5)
Chloride: 104 mEq/L (ref 96–112)
Creatinine, Ser: 1.5 mg/dL (ref 0.40–1.50)
GFR: 42.7 mL/min — ABNORMAL LOW (ref >60.00)
Glucose, Bld: 189 mg/dL — ABNORMAL HIGH (ref 70–99)
Potassium: 4.7 mEq/L (ref 3.5–5.1)
Sodium: 137 mEq/L (ref 135–145)
Total Bilirubin: 0.5 mg/dL (ref 0.2–1.2)
Total Protein: 6.4 g/dL (ref 6.0–8.3)

## 2020-10-31 LAB — CBC
HCT: 39 % (ref 39.0–52.0)
Hemoglobin: 13.1 g/dL (ref 13.0–17.0)
MCHC: 33.6 g/dL (ref 30.0–36.0)
MCV: 95.2 fl (ref 78.0–100.0)
Platelets: 235 10*3/uL (ref 150.0–400.0)
RBC: 4.1 Mil/uL — ABNORMAL LOW (ref 4.22–5.81)
RDW: 14.5 % (ref 11.5–15.5)
WBC: 7.3 10*3/uL (ref 4.0–10.5)

## 2020-10-31 LAB — HEMOGLOBIN A1C: Hgb A1c MFr Bld: 7.2 % — ABNORMAL HIGH (ref 4.6–6.5)

## 2020-10-31 LAB — TSH: TSH: 1.28 u[IU]/mL (ref 0.35–4.50)

## 2020-11-19 ENCOUNTER — Ambulatory Visit (INDEPENDENT_AMBULATORY_CARE_PROVIDER_SITE_OTHER): Payer: Medicare Other | Admitting: Family Medicine

## 2020-11-19 ENCOUNTER — Encounter: Payer: Self-pay | Admitting: Family Medicine

## 2020-11-19 ENCOUNTER — Other Ambulatory Visit: Payer: Self-pay | Admitting: Family Medicine

## 2020-11-19 ENCOUNTER — Other Ambulatory Visit: Payer: Self-pay

## 2020-11-19 VITALS — BP 112/62 | HR 62 | Temp 97.8°F | Resp 16 | Wt 165.2 lb

## 2020-11-19 DIAGNOSIS — E782 Mixed hyperlipidemia: Secondary | ICD-10-CM

## 2020-11-19 DIAGNOSIS — E119 Type 2 diabetes mellitus without complications: Secondary | ICD-10-CM | POA: Diagnosis not present

## 2020-11-19 DIAGNOSIS — K635 Polyp of colon: Secondary | ICD-10-CM | POA: Diagnosis not present

## 2020-11-19 DIAGNOSIS — K5900 Constipation, unspecified: Secondary | ICD-10-CM | POA: Diagnosis not present

## 2020-11-19 DIAGNOSIS — C61 Malignant neoplasm of prostate: Secondary | ICD-10-CM

## 2020-11-19 NOTE — Patient Instructions (Signed)
Shingrix is the new shingles shot, 2 shots over 2-6 months, confirm coverage with insurance and document, then can return here for shots with nurse appt or at pharmacy Colon Polyps  Colon polyps are tissue growths inside the colon, which is part of the large intestine. They are one of the types of polyps that can grow in the body. A polyp may be a round bump or a mushroom-shaped growth. You could have one polyp or more than one. Most colon polyps are noncancerous (benign). However, some colon polyps can become cancerous over time. Finding and removing the polyps early can help prevent this. What are the causes? The exact cause of colon polyps is not known. What increases the risk? The following factors may make you more likely to develop this condition:  Having a family history of colorectal cancer or colon polyps.  Being older than 84 years of age.  Being younger than 84 years of age and having a significant family history of colorectal cancer or colon polyps or a genetic condition that puts you at higher risk of getting colon polyps.  Having inflammatory bowel disease, such as ulcerative colitis or Crohn's disease.  Having certain conditions passed from parent to child (hereditary conditions), such as: ? Familial adenomatous polyposis (FAP). ? Lynch syndrome. ? Turcot syndrome. ? Peutz-Jeghers syndrome. ? MUTYH-associated polyposis (MAP).  Being overweight.  Certain lifestyle factors. These include smoking cigarettes, drinking too much alcohol, not getting enough exercise, and eating a diet that is high in fat and red meat and low in fiber.  Having had childhood cancer that was treated with radiation of the abdomen. What are the signs or symptoms? Many times, there are no symptoms. If you have symptoms, they may include:  Blood coming from the rectum during a bowel movement.  Blood in the stool (feces). The blood may be bright red or very dark in color.  Pain in the  abdomen.  A change in bowel habits, such as constipation or diarrhea. How is this diagnosed? This condition is diagnosed with a colonoscopy. This is a procedure in which a lighted, flexible scope is inserted into the opening between the buttocks (anus) and then passed into the colon to examine the area. Polyps are sometimes found when a colonoscopy is done as part of routine cancer screening tests. How is this treated? This condition is treated by removing any polyps that are found. Most polyps can be removed during a colonoscopy. Those polyps will then be tested for cancer. Additional treatment may be needed depending on the results of testing. Follow these instructions at home: Eating and drinking  Eat foods that are high in fiber, such as fruits, vegetables, and whole grains.  Eat foods that are high in calcium and vitamin D, such as milk, cheese, yogurt, eggs, liver, fish, and broccoli.  Limit foods that are high in fat, such as fried foods and desserts.  Limit the amount of red meat, precooked or cured meat, or other processed meat that you eat, such as hot dogs, sausages, bacon, or meat loaves.  Limit sugary drinks.   Lifestyle  Maintain a healthy weight, or lose weight if recommended by your health care provider.  Exercise every day or as told by your health care provider.  Do not use any products that contain nicotine or tobacco, such as cigarettes, e-cigarettes, and chewing tobacco. If you need help quitting, ask your health care provider.  Do not drink alcohol if: ? Your health care provider tells you  not to drink. ? You are pregnant, may be pregnant, or are planning to become pregnant.  If you drink alcohol: ? Limit how much you use to:  0-1 drink a day for women.  0-2 drinks a day for men. ? Know how much alcohol is in your drink. In the [place] one drink equals one 12 oz bottle of beer (355 mL), one 5 oz glass of wine (148 mL), or one 1 oz glass of hard liquor (44  mL). General instructions  Take over-the-counter and prescription medicines only as told by your health care provider.  Keep all follow-up visits. This is important. This includes having regularly scheduled colonoscopies. Talk to your health care provider about when you need a colonoscopy. Contact a health care provider if:  You have new or worsening bleeding during a bowel movement.  You have new or increased blood in your stool.  You have a change in bowel habits.  You lose weight for no known reason. Summary  Colon polyps are tissue growths inside the colon, which is part of the large intestine. They are one type of polyp that can grow in the body.  Most colon polyps are noncancerous (benign), but some can become cancerous over time.  This condition is diagnosed with a colonoscopy.  This condition is treated by removing any polyps that are found. Most polyps can be removed during a colonoscopy. This information is not intended to replace advice given to you by your health care provider. Make sure you discuss any questions you have with your health care provider. Document Revised: 10/12/2019 Document Reviewed: 10/12/2019 Elsevier Patient Education  2021 [employer].

## 2020-11-19 NOTE — Assessment & Plan Note (Signed)
1/2 dose of miralax daily has helped greatly. No changes to regiemen is taking Metamucil can consider Benefiber in the future.

## 2020-11-19 NOTE — Assessment & Plan Note (Signed)
He tolrologyerated his radiation and continues to follow with urology

## 2020-11-19 NOTE — Assessment & Plan Note (Signed)
Tolerating statin, encouraged heart healthy diet, avoid trans fats, minimize simple carbs and saturated fats. Increase exercise as tolerated 

## 2020-11-19 NOTE — Progress Notes (Signed)
Patient ID: [name], male    DOB: 1934/12/31  Age: 86 y.o. MRN: [id number]    Subjective:  Subjective  HPI [name] presents for office visit today for follow up on DM type 2 and hyperlipidemia. He states that he has no recent illnesses or recent ER visits to report. He reports that his glucose reading yesterday was 106 and rarely goes above 130. He denies any chest pain, SOB, fever, abdominal pain, cough, chills, sore throat, dysuria, urinary incontinence, back pain, HA, or N/VD. He reports that since he is experiencing constipation, he takes miralax which then he has 2 weeks of normal bowel movements. He expresses interest in reducing his dosage of miralax as a result.   Review of Systems  Constitutional: Negative for chills, fatigue and fever.  HENT: Negative for congestion, rhinorrhea, sinus pressure, sinus pain and sore throat.   Eyes: Negative for pain.  Respiratory: Negative for cough and shortness of breath.   Cardiovascular: Negative for chest pain, palpitations and leg swelling.  Gastrointestinal: Positive for constipation. Negative for abdominal pain, blood in stool, diarrhea, nausea and vomiting.  Genitourinary: Negative for flank pain, frequency and penile pain.  Musculoskeletal: Negative for back pain.  Neurological: Negative for headaches.    History Past Medical History:  Diagnosis Date  . Allergy    mild  . Arthritis    fingers, hip  . BPH (benign prostatic hypertrophy)   . Cataract    removed both eyes  . Complication of anesthesia    gases nasally causes sneezing   . Diabetes mellitus 2000   type 2  . Diabetes mellitus type 2 in nonobese [facility]) 06/19/2009   Qualifier: Diagnosis of  By: Wynona Luna    . GERD (gastroesophageal reflux disease)   . Glaucoma    [facility] Lanell [facility]ar [facility]  . Hyperlipidemia   . Left hip pain 03/14/2012  . Malignant neoplasm of prostate ([place]) 03/15/2019  . Medicare annual wellness visit,  subsequent 12/31/2015  . Onychomycosis 09/17/2015  . Prostate cancer ([place])   . Tubular adenoma of colon 2003  . Vasomotor rhinitis 05/02/2013    He has a past surgical history that includes Appendectomy (1943); Eye surgery; Cystoscopy with insertion of urolift; Trabeculectomy (Left, 09/04/2016); Colonoscopy; Polypectomy; Cataract extraction, bilateral; and Total hip arthroplasty (Left, 11/15/2019).   His family history includes Arthritis in his brother; Breast cancer in his sister; Colon cancer (age of onset: 23) in his paternal uncle; Diabetes in his father; Heart disease in his brother and father; Obstructive Sleep Apnea in his sister.He reports that he has never smoked. He has never used smokeless tobacco. He reports current alcohol use. He reports that he does not use drugs.  Current Outpatient Medications on File Prior to Visit  Medication Sig Dispense Refill  . azithromycin (ZITHROMAX) 250 MG tablet Take 2 tablets by mouth on day 1, followed by 1 tablet by mouth daily for 4 days. 6 tablet 0  . benzonatate (TESSALON) 100 MG capsule Take 1 capsule (100 mg total) by mouth 3 (three) times daily as needed. 30 capsule 0  . brimonidine-timolol (COMBIGAN) 0.2-0.5 % ophthalmic solution Place 1 drop into the right eye 2 (two) times daily.     . brinzolamide (AZOPT) 1 % ophthalmic suspension Place 1 drop into the right eye 3 (three) times daily.    . cholecalciferol (VITAMIN D3) 25 MCG (1000 UNIT) tablet Take 1,000 Units by mouth daily.    . empagliflozin (JARDIANCE) 10  MG TABS tablet Take 2 tablets (20 mg total) by mouth daily. 180 tablet 1  . famotidine (PEPCID) 20 MG tablet TAKE 1 TABLET DAILY AS NEEDED FOR HEARTBURN OR INDIGESTION 90 tablet 3  . fluticasone (FLONASE) 50 MCG/ACT nasal spray Place 2 sprays into both nostrils daily. 16 g 1  . glyBURIDE (DIABETA) 5 MG tablet Take 2 tablets (10 mg total) by mouth 2 (two) times daily. 180 tablet 1  . GLYXAMBI 25-5 MG TABS     . ipratropium (ATROVENT)  0.03 % nasal spray Place 2 sprays into both nostrils 2 (two) times daily as needed for rhinitis. 90 mL 3  . metFORMIN (GLUCOPHAGE) 1000 MG tablet TAKE 1 TABLET BY MOUTH IN MORNING, AND TAKE 1/2 TABLET BY MOUTH AT NOON AND TAKE 1 TABLET BY MOUTH IN THE EVENING 225 tablet 1  . Multiple Vitamin (MULTIVITAMIN WITH MINERALS) TABS tablet Take 1 tablet by mouth daily.    . simvastatin (ZOCOR) 10 MG tablet TAKE 1 TABLET AT BEDTIME 90 tablet 3  . sitaGLIPtin (JANUVIA) 100 MG tablet Take 100 mg by mouth daily.    . travoprost, benzalkonium, (TRAVATAN) 0.004 % ophthalmic solution Place 1 drop into the right eye at bedtime.    . valACYclovir (VALTREX) 1000 MG tablet TAKE 1 TABLET DAILY 90 tablet 3  . [employer] LANCETS MISC Use as directed once daily to check blood sugar.  DX E11.9 100 each 6   No current facility-administered medications on file prior to visit.     Objective:  Objective  Physical Exam Constitutional:      General: He is not in acute distress.    Appearance: Normal appearance. He is not ill-appearing or toxic-appearing.  HENT:     Head: Normocephalic and atraumatic.     Right Ear: Tympanic membrane, ear canal and external ear normal.     Left Ear: Tympanic membrane, ear canal and external ear normal.     Nose: No congestion or rhinorrhea.  Eyes:     Extraocular Movements: Extraocular movements intact.     Pupils: Pupils are equal, round, and reactive to light.  Cardiovascular:     Rate and Rhythm: Normal rate and regular rhythm.     Pulses: Normal pulses.     Heart sounds: Normal heart sounds. No murmur heard.   Pulmonary:     Effort: Pulmonary effort is normal. No respiratory distress.     Breath sounds: Normal breath sounds. No wheezing, rhonchi or rales.  Abdominal:     General: Bowel sounds are normal.     Palpations: Abdomen is soft. There is no mass.     Tenderness: There is no abdominal tenderness. There is no guarding.     Hernia: No hernia is present.   Musculoskeletal:        General: Normal range of motion.     Cervical back: Normal range of motion and neck supple.  Skin:    General: Skin is warm and dry.  Neurological:     Mental Status: He is alert and oriented to person, place, and time.  Psychiatric:        Behavior: Behavior normal.    BP 112/62   Pulse 62   Temp 97.8 F (36.6 C)   Resp 16   Wt 165 lb 3.2 oz (74.9 kg)   SpO2 99%   BMI 22.10 kg/m  Wt Readings from Last 3 Encounters:  11/19/20 165 lb 3.2 oz (74.9 kg)  11/15/19 171 lb 11.8 oz (77.9 kg)  11/07/19 170 lb (77.1 kg)     Lab Results  Component Value Date   WBC 7.3 10/31/2020   HGB 13.1 10/31/2020   HCT 39.0 10/31/2020   PLT 235.0 10/31/2020   GLUCOSE 189 (H) 10/31/2020   CHOL 168 10/31/2020   TRIG 139.0 10/31/2020   HDL 55.80 10/31/2020   LDLCALC 84 10/31/2020   ALT 11 10/31/2020   AST 12 10/31/2020   NA 137 10/31/2020   K 4.7 10/31/2020   CL 104 10/31/2020   CREATININE 1.50 10/31/2020   BUN 24 (H) 10/31/2020   CO2 25 10/31/2020   TSH 1.28 10/31/2020   PSA 6.05 (H) 12/03/2017   HGBA1C 7.2 (H) 10/31/2020   MICROALBUR <0.7 02/11/2019    No results found.   Assessment & Plan:  Plan    No orders of the defined types were placed in this encounter.   Problem List Items Addressed This Visit    Diabetes mellitus type 2 in nonobese [facility])    He is doing well on Jardiance and his sugars have remained below 130 and he has not had any low numbers. hgba1c acceptable, minimize simple carbs. Increase exercise as tolerated. Continue current meds       Relevant Orders   Hemoglobin A1c   Comprehensive metabolic panel   TSH   Hyperlipidemia    Tolerating statin, encouraged heart healthy diet, avoid trans fats, minimize simple carbs and saturated fats. Increase exercise as tolerated       Relevant Orders   Lipid panel   TSH   Malignant neoplasm of prostate [facility])    He tolrologyerated his radiation and continues to follow with urology       Constipation    1/2 dose of miralax daily has helped greatly. No changes to regiemen is taking Metamucil can consider Benefiber in the future.      Relevant Orders   Ambulatory referral to Gastroenterology   CBC   TSH    Other Visit Diagnoses    Polyp of colon, unspecified part of colon, unspecified type    -  Primary   Relevant Orders   Ambulatory referral to Gastroenterology      Follow-up: Return in about 4 months (around 03/22/2021), or lab appt 9/16 then a visit a week later.   I,David Hanna,acting as a scribe for Penni Homans, MD.,have documented all relevant documentation on the behalf of Penni Homans, MD,as directed by  Penni Homans, MD while in the presence of Penni Homans, MD.  I, Mosie Lukes, MD personally performed the services described in this documentation. All medical record entries made by the scribe were at my direction and in my presence. I have reviewed the chart and agree that the record reflects my personal performance and is accurate and complete

## 2020-11-19 NOTE — Assessment & Plan Note (Signed)
He is doing well on Jardiance and his sugars have remained below 130 and he has not had any low numbers. hgba1c acceptable, minimize simple carbs. Increase exercise as tolerated. Continue current meds

## 2020-11-25 ENCOUNTER — Other Ambulatory Visit: Payer: Self-pay | Admitting: Family Medicine

## 2021-01-10 ENCOUNTER — Other Ambulatory Visit: Payer: Self-pay | Admitting: Family Medicine

## 2021-01-30 ENCOUNTER — Telehealth: Payer: Self-pay | Admitting: Family Medicine

## 2021-01-30 ENCOUNTER — Other Ambulatory Visit: Payer: Self-pay

## 2021-01-30 MED ORDER — METFORMIN HCL 1000 MG PO TABS
ORAL_TABLET | ORAL | 1 refills | Status: DC
Start: 2021-01-30 — End: 2021-05-22

## 2021-01-30 NOTE — Telephone Encounter (Signed)
Medication sent in. 

## 2021-01-30 NOTE — Telephone Encounter (Signed)
New pharmacy  Medication: metFORMIN (GLUCOPHAGE) 1000 MG tablet [id number]      Has the patient contacted their pharmacy? No. (If no, request that the patient contact the pharmacy for the refill.) (If yes, when and what did the pharmacy advise?)     Preferred Pharmacy (with phone number or street name):  [facility] - [address], [place] Phone:  [phone]  Fax:  [phone]         Agent: Please be advised that RX refills may take up to 3 business days. We ask that you follow-up with your pharmacy.

## 2021-02-04 LAB — HM DIABETES EYE EXAM

## 2021-03-13 ENCOUNTER — Encounter: Payer: Self-pay | Admitting: Gastroenterology

## 2021-03-22 ENCOUNTER — Other Ambulatory Visit (INDEPENDENT_AMBULATORY_CARE_PROVIDER_SITE_OTHER): Payer: Medicare Other

## 2021-03-22 ENCOUNTER — Other Ambulatory Visit: Payer: Self-pay

## 2021-03-22 DIAGNOSIS — E119 Type 2 diabetes mellitus without complications: Secondary | ICD-10-CM

## 2021-03-22 DIAGNOSIS — K5900 Constipation, unspecified: Secondary | ICD-10-CM | POA: Diagnosis not present

## 2021-03-22 DIAGNOSIS — E782 Mixed hyperlipidemia: Secondary | ICD-10-CM | POA: Diagnosis not present

## 2021-03-22 LAB — TSH: TSH: 1.85 u[IU]/mL (ref 0.35–5.50)

## 2021-03-22 LAB — CBC
HCT: 41.5 % (ref 39.0–52.0)
Hemoglobin: 13.6 g/dL (ref 13.0–17.0)
MCHC: 32.8 g/dL (ref 30.0–36.0)
MCV: 96.4 fl (ref 78.0–100.0)
Platelets: 225 10*3/uL (ref 150.0–400.0)
RBC: 4.31 Mil/uL (ref 4.22–5.81)
RDW: 14.8 % (ref 11.5–15.5)
WBC: 6.8 10*3/uL (ref 4.0–10.5)

## 2021-03-22 LAB — LIPID PANEL
Cholesterol: 169 mg/dL (ref 0–200)
HDL: 64.1 mg/dL (ref >39.00)
LDL Cholesterol: 79 mg/dL (ref 0–99)
NonHDL: 104.55
Total CHOL/HDL Ratio: 3
Triglycerides: 130 mg/dL (ref 0.0–149.0)
VLDL: 26 mg/dL (ref 0.0–40.0)

## 2021-03-22 LAB — COMPREHENSIVE METABOLIC PANEL
ALT: 16 U/L (ref 0–53)
AST: 15 U/L (ref 0–37)
Albumin: 4.1 g/dL (ref 3.5–5.2)
Alkaline Phosphatase: 55 U/L (ref 39–117)
BUN: 21 mg/dL (ref 6–23)
CO2: 26 mEq/L (ref 19–32)
Calcium: 9.2 mg/dL (ref 8.4–10.5)
Chloride: 104 mEq/L (ref 96–112)
Creatinine, Ser: 1.38 mg/dL (ref 0.40–1.50)
GFR: 47.07 mL/min — ABNORMAL LOW (ref >60.00)
Glucose, Bld: 164 mg/dL — ABNORMAL HIGH (ref 70–99)
Potassium: 4.5 mEq/L (ref 3.5–5.1)
Sodium: 138 mEq/L (ref 135–145)
Total Bilirubin: 0.5 mg/dL (ref 0.2–1.2)
Total Protein: 6.3 g/dL (ref 6.0–8.3)

## 2021-03-22 LAB — HEMOGLOBIN A1C: Hgb A1c MFr Bld: 7.1 % — ABNORMAL HIGH (ref 4.6–6.5)

## 2021-03-25 ENCOUNTER — Ambulatory Visit (INDEPENDENT_AMBULATORY_CARE_PROVIDER_SITE_OTHER): Payer: Medicare Other | Admitting: Family Medicine

## 2021-03-25 ENCOUNTER — Other Ambulatory Visit: Payer: Self-pay

## 2021-03-25 ENCOUNTER — Encounter: Payer: Self-pay | Admitting: Family Medicine

## 2021-03-25 VITALS — BP 108/62 | HR 57 | Temp 97.5°F | Resp 16 | Wt 164.0 lb

## 2021-03-25 DIAGNOSIS — K5900 Constipation, unspecified: Secondary | ICD-10-CM

## 2021-03-25 DIAGNOSIS — E119 Type 2 diabetes mellitus without complications: Secondary | ICD-10-CM

## 2021-03-25 DIAGNOSIS — E782 Mixed hyperlipidemia: Secondary | ICD-10-CM

## 2021-03-25 DIAGNOSIS — Z23 Encounter for immunization: Secondary | ICD-10-CM | POA: Diagnosis not present

## 2021-03-25 LAB — MICROALBUMIN / CREATININE URINE RATIO
Creatinine,U: 77.4 mg/dL
Microalb Creat Ratio: 0.9 mg/g (ref 0.0–30.0)
Microalb, Ur: 0.7 mg/dL (ref 0.0–1.9)

## 2021-03-25 NOTE — Patient Instructions (Addendum)
Can consider adding Benefiber to Miralax if ever needed.   Shingrix is the new shingles shot, 2 shots over 2-6 months, confirm coverage with insurance and document, then can return here for shots with nurse appt or at pharmacy   Tdap is the tetanus shot to get if you get if you get injured, can get it at office if injury occurs   Paxlovid and Molnupiravir the new COVID medication we can give you if you get COVID so make sure you test if you have symptoms because we have to treat by day 5 of symptoms for it to be effective. If you are positive let us know so we can treat. If a home test is negative and your symptoms are persistent get a PCR test. Can check testing locations at [email or web address] If you are positive we will make an appointment with [place] and we will send in Paxlovid if you would like it. Check with your pharmacy before we meet to confirm they have it in stock, if they do not then we can get the prescription at the [place]   Call Gastroenterology at [phone] to schedule next appointment with Dr Fuller Plan for colonoscopy consideration

## 2021-03-25 NOTE — Progress Notes (Signed)
Subjective:   By signing my name below, I, Zite Okoli, attest that this documentation has been prepared under the direction and in the presence of Mosie Lukes, MD. 03/25/2021    Patient ID: [name], male    DOB: [birth year], 86 y.o.   MRN: [id number]  Chief Complaint  Patient presents with   4 months f/u    HPI Patient is in today for an office visit.  He uses a full dose of Miralax everyday to help with his bowel movements. He denies blood in stool.  He recently saw his urologist and his PSA has reduced from 0.8 to 0.5 and now has 6 month f/u appointments.  He monitors his sugars at home and mentions it ranges from the 120's to 130's. They can be as high as 170 when he eats some specific foods.  He also wants to get a colonoscopy because he is worried about his history of colo polyps.  He reports he has a shorter right leg and has to use lifts in his shoes. Dr Alvan Dame recommended him to [place].  He is willing to get the flu vaccine today. He has 4 Pfizer Covid-19 vaccines at this time and is willing to get the next booster vaccine. He is wiling to get the shingles vaccine at a later time.  Past Medical History:  Diagnosis Date   Allergy    mild   Arthritis    fingers, hip   BPH (benign prostatic hypertrophy)    Cataract    removed both eyes   Complication of anesthesia    gases nasally causes sneezing    Diabetes mellitus 2000   type 2   Diabetes mellitus type 2 in nonobese ([place]) 06/19/2009   Qualifier: Diagnosis of  By: Wynona Luna     GERD (gastroesophageal reflux disease)    Glaucoma    followed by Dr Lanell Matar at [facility]   Hyperlipidemia    Left hip pain 03/14/2012   Malignant neoplasm of prostate ([place]) 03/15/2019   Medicare annual wellness visit, subsequent 12/31/2015   Onychomycosis 09/17/2015   Prostate cancer ([place])    Tubular adenoma of colon 2003   Vasomotor rhinitis 05/02/2013    Past Surgical History:  Procedure Laterality Date    APPENDECTOMY  1943   CATARACT EXTRACTION, BILATERAL     COLONOSCOPY     CYSTOSCOPY WITH INSERTION OF UROLIFT     EYE SURGERY     laser surgery on left numerous times.    POLYPECTOMY     TOTAL HIP ARTHROPLASTY Left 11/15/2019   Procedure: TOTAL HIP ARTHROPLASTY ANTERIOR APPROACH;  Surgeon: Paralee Cancel, MD;  Location: [facility];  Service: Orthopedics;  Laterality: Left;  70 mins   TRABECULECTOMY Left 09/04/2016    Family History  Problem Relation Age of Onset   Obstructive Sleep Apnea Sister    Heart disease Father        MI   Diabetes Father    Arthritis Brother        knees   Heart disease Brother        a fib   Breast cancer Sister    Colon cancer Paternal Uncle 87   Colon polyps Neg Hx    Esophageal cancer Neg Hx    Rectal cancer Neg Hx    Stomach cancer Neg Hx    Prostate cancer Neg Hx     Social History   Socioeconomic History   Marital status: Married  Spouse name: Avera   Number of children: 0   Years of education: Not on file   Highest education level: Not on file  Occupational History   Occupation: semi retired    Fish farm manager: [employer]    Employer: RETIRED  Tobacco Use   Smoking status: Never   Smokeless tobacco: Never  Vaping Use   Vaping Use: Never used  Substance and Sexual Activity   Alcohol use: Yes    Alcohol/week: 0.0 standard drinks    Comment: once monthly "maybe"   Drug use: No   Sexual activity: Yes    Comment: lives with wife, travels frequently, no major dietary restrictions. exercises regularly with aerobic, stretch and weights  Other Topics Concern   Not on file  Social History Narrative   Not on file   Social Determinants of Health   Financial Resource Strain: Not on file  Food Insecurity: Not on file  Transportation Needs: Not on file  Physical Activity: Not on file  Stress: Not on file  Social Connections: Not on file  Intimate Partner Violence: Not on file    Outpatient Medications Prior to Visit  Medication Sig Dispense  Refill   brimonidine-timolol (COMBIGAN) 0.2-0.5 % ophthalmic solution Place 1 drop into the right eye 2 (two) times daily.      brinzolamide (AZOPT) 1 % ophthalmic suspension Place 1 drop into the right eye 3 (three) times daily.     cholecalciferol (VITAMIN D3) 25 MCG (1000 UNIT) tablet Take 1,000 Units by mouth daily.     famotidine (PEPCID) 20 MG tablet TAKE 1 TABLET DAILY AS NEEDED FOR HEARTBURN OR INDIGESTION 90 tablet 3   fluticasone (FLONASE) 50 MCG/ACT nasal spray Place 2 sprays into both nostrils daily. 16 g 1   glucose blood (TRUE METRIX BLOOD GLUCOSE TEST) test strip USE TO TEST BLOOD SUGAR ONCE DAILY 100 strip 12   glyBURIDE (DIABETA) 5 MG tablet TAKE 2 TABLETS TWICE DAILY 360 tablet 1   ipratropium (ATROVENT) 0.03 % nasal spray Place 2 sprays into both nostrils 2 (two) times daily as needed for rhinitis. 90 mL 3   JANUVIA 100 MG tablet TAKE 1 TABLET DAILY 90 tablet 1   JARDIANCE 10 MG TABS tablet TAKE 2 TABLETS EVERY DAY 180 tablet 1   metFORMIN (GLUCOPHAGE) 1000 MG tablet TAKE 1 TABLET BY MOUTH IN MORNING, AND TAKE 1/2 TABLET BY MOUTH AT NOON AND TAKE 1 TABLET BY MOUTH IN THE EVENING 225 tablet 1   Multiple Vitamin (MULTIVITAMIN WITH MINERALS) TABS tablet Take 1 tablet by mouth daily.     simvastatin (ZOCOR) 10 MG tablet TAKE 1 TABLET AT BEDTIME 90 tablet 1   travoprost, benzalkonium, (TRAVATAN) 0.004 % ophthalmic solution Place 1 drop into the right eye at bedtime.     valACYclovir (VALTREX) 1000 MG tablet TAKE 1 TABLET DAILY 90 tablet 3   [place] LANCETS MISC Use as directed once daily to check blood sugar.  DX E11.9 100 each 6   azithromycin (ZITHROMAX) 250 MG tablet Take 2 tablets by mouth on day 1, followed by 1 tablet by mouth daily for 4 days. 6 tablet 0   benzonatate (TESSALON) 100 MG capsule Take 1 capsule (100 mg total) by mouth 3 (three) times daily as needed. 30 capsule 0   GLYXAMBI 25-5 MG TABS      No facility-administered medications prior to visit.    Allergies   Allergen Reactions   Levaquin [Levofloxacin In D5w] Hives    Review of Systems  Constitutional:  Negative for fever and malaise/fatigue.  HENT:  Negative for congestion.   Eyes:  Negative for redness.  Respiratory:  Negative for shortness of breath.   Cardiovascular:  Negative for chest pain, palpitations and leg swelling.  Gastrointestinal:  Negative for abdominal pain, blood in stool and nausea.  Genitourinary:  Negative for dysuria and frequency.  Musculoskeletal:  Negative for falls.  Skin:  Negative for rash.  Neurological:  Negative for dizziness, loss of consciousness and headaches.  Endo/Heme/Allergies:  Negative for polydipsia.  Psychiatric/Behavioral:  Negative for depression. The patient is not nervous/anxious.       Objective:    Physical Exam Constitutional:      General: He is not in acute distress. HENT:     Head: Normocephalic and atraumatic.  Eyes:     Conjunctiva/sclera: Conjunctivae normal.  Neck:     Thyroid: No thyromegaly.  Cardiovascular:     Rate and Rhythm: Normal rate and regular rhythm.     Heart sounds: Normal heart sounds. No murmur heard. Pulmonary:     Effort: Pulmonary effort is normal. No respiratory distress.     Breath sounds: Normal breath sounds.  Abdominal:     General: There is no distension.     Palpations: There is no mass.     Tenderness: There is no abdominal tenderness.  Musculoskeletal:     Cervical back: Neck supple.  Skin:    General: Skin is warm.  Neurological:     Mental Status: He is alert and oriented to person, place, and time.  Psychiatric:        Mood and Affect: Affect normal.        Cognition and Memory: Memory normal.        Judgment: Judgment normal.     BP 108/62   Pulse (!) 57   Temp (!) 97.5 F (36.4 C)   Resp 16   Wt 164 lb (74.4 kg)   SpO2 96%   BMI 21.94 kg/m  Wt Readings from Last 3 Encounters:  03/25/21 164 lb (74.4 kg)  11/19/20 165 lb 3.2 oz (74.9 kg)  11/15/19 171 lb 11.8 oz (77.9  kg)    Diabetic Foot Exam - Simple   No data filed    Lab Results  Component Value Date   WBC 6.8 03/22/2021   HGB 13.6 03/22/2021   HCT 41.5 03/22/2021   PLT 225.0 03/22/2021   GLUCOSE 164 (H) 03/22/2021   CHOL 169 03/22/2021   TRIG 130.0 03/22/2021   HDL 64.10 03/22/2021   LDLCALC 79 03/22/2021   ALT 16 03/22/2021   AST 15 03/22/2021   NA 138 03/22/2021   K 4.5 03/22/2021   CL 104 03/22/2021   CREATININE 1.38 03/22/2021   BUN 21 03/22/2021   CO2 26 03/22/2021   TSH 1.85 03/22/2021   PSA 6.05 (H) 12/03/2017   HGBA1C 7.1 (H) 03/22/2021   MICROALBUR 0.7 03/25/2021    Lab Results  Component Value Date   TSH 1.85 03/22/2021   Lab Results  Component Value Date   WBC 6.8 03/22/2021   HGB 13.6 03/22/2021   HCT 41.5 03/22/2021   MCV 96.4 03/22/2021   PLT 225.0 03/22/2021   Lab Results  Component Value Date   NA 138 03/22/2021   K 4.5 03/22/2021   CO2 26 03/22/2021   GLUCOSE 164 (H) 03/22/2021   BUN 21 03/22/2021   CREATININE 1.38 03/22/2021   BILITOT 0.5 03/22/2021   ALKPHOS 55 03/22/2021  AST 15 03/22/2021   ALT 16 03/22/2021   PROT 6.3 03/22/2021   ALBUMIN 4.1 03/22/2021   CALCIUM 9.2 03/22/2021   ANIONGAP 9 11/16/2019   GFR 47.07 (L) 03/22/2021   Lab Results  Component Value Date   CHOL 169 03/22/2021   Lab Results  Component Value Date   HDL 64.10 03/22/2021   Lab Results  Component Value Date   LDLCALC 79 03/22/2021   Lab Results  Component Value Date   TRIG 130.0 03/22/2021   Lab Results  Component Value Date   CHOLHDL 3 03/22/2021   Lab Results  Component Value Date   HGBA1C 7.1 (H) 03/22/2021       Assessment & Plan:   Problem List Items Addressed This Visit     Diabetes mellitus type 2 in nonobese ([place])    hgba1c acceptable, minimize simple carbs. Increase exercise as tolerated. Continue current meds. Given flu shot today      Relevant Orders   Microalbumin / creatinine urine ratio (Completed)   Comprehensive  metabolic panel   TSH   Hemoglobin A1c   Hyperlipidemia    Encourage heart healthy diet such as MIND or DASH diet, increase exercise, avoid trans fats, simple carbohydrates and processed foods, consider a krill or fish or flaxseed oil cap daily. Tolerating Simvastatin      Relevant Orders   Lipid panel   TSH   Constipation    Encouraged increased hydration and fiber in diet. Daily probiotics. If bowels not moving can use MOM 2 tbls po in 4 oz of warm prune juice by mouth every 2-3 days. If no results then repeat in 4 hours with  Dulcolax suppository pr, may repeat again in 4 more hours as needed. Seek care if symptoms worsen. Consider daily Miralax and/or Dulcolax if symptoms persist.       Relevant Orders   CBC   TSH   Other Visit Diagnoses     Need for influenza vaccination    -  Primary   Relevant Orders   Flu Vaccine QUAD High Dose(Fluad) (Completed)        No orders of the defined types were placed in this encounter.   I,Zite Okoli,acting as a Education administrator for Penni Homans, MD.,have documented all relevant documentation on the behalf of Penni Homans, MD,as directed by  Penni Homans, MD while in the presence of Penni Homans, MD.   I, Mosie Lukes, MD., personally preformed the services described in this documentation.  All medical record entries made by the scribe were at my direction and in my presence.  I have reviewed the chart and discharge instructions (if applicable) and agree that the record reflects my personal performance and is accurate and complete. 03/25/2021

## 2021-03-29 ENCOUNTER — Encounter: Payer: Self-pay | Admitting: *Deleted

## 2021-03-29 NOTE — Assessment & Plan Note (Addendum)
hgba1c acceptable, minimize simple carbs. Increase exercise as tolerated. Continue current meds. Given flu shot today

## 2021-03-29 NOTE — Assessment & Plan Note (Signed)
Encouraged increased hydration and fiber in diet. Daily probiotics. If bowels not moving can use MOM 2 tbls po in 4 oz of warm prune juice by mouth every 2-3 days. If no results then repeat in 4 hours with  Dulcolax suppository pr, may repeat again in 4 more hours as needed. Seek care if symptoms worsen. Consider daily Miralax and/or Dulcolax if symptoms persist.  

## 2021-03-29 NOTE — Assessment & Plan Note (Signed)
Encourage heart healthy diet such as MIND or DASH diet, increase exercise, avoid trans fats, simple carbohydrates and processed foods, consider a krill or fish or flaxseed oil cap daily. Tolerating Simvastatin 

## 2021-04-10 ENCOUNTER — Other Ambulatory Visit: Payer: Self-pay | Admitting: Family Medicine

## 2021-04-11 ENCOUNTER — Telehealth: Payer: Self-pay | Admitting: Family Medicine

## 2021-04-11 NOTE — Telephone Encounter (Signed)
Error

## 2021-04-11 NOTE — Telephone Encounter (Signed)
Patient needing a refill on   He has started a new pharmacy, [facility]???? Phone number to them [phone] Also an email:[email or web address]      famotidine (PEPCID) 20 MG tablet [[id number]]

## 2021-04-12 ENCOUNTER — Other Ambulatory Visit: Payer: Self-pay

## 2021-04-12 NOTE — Telephone Encounter (Signed)
Medication sent to local pharmacy I could not find that one in the system

## 2021-04-14 IMAGING — MR MR PROSTATE WO/W CM
56 series · 56 of 56 positions shown · IV contrast (15 ml multihance)
Comparison: None.

CLINICAL DATA: Elevated PSA.  Negative biopsy.

EXAM:
MR PROSTATE WITHOUT AND WITH CONTRAST
TECHNIQUE: Multiplanar multisequence MRI images were obtained of the pelvis
centered about the prostate. Pre and post contrast images were
obtained.
CONTRAST:  15mL MULTIHANCE GADOBENATE DIMEGLUMINE 529 MG/ML IV SOLN

[Series 3: T1 · axial · 8.0mm · 1.06mm/px · 1 of 28 slices shown (1 of 2)]
[im 1/28]
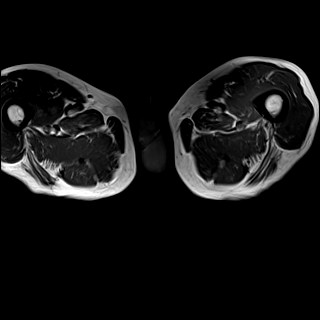

[Series 4: bSSFP fat-sat · axial · 8.0mm · 0.74mm/px · 1 of 28 slices shown]
[im 1/28]
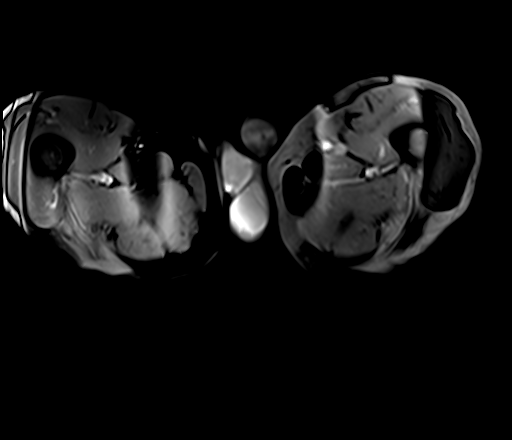

[Series 5: T2 · sagittal · 3.5mm · 0.56mm/px · 1 of 39 slices shown (1 of 4)]
[im 1/39]
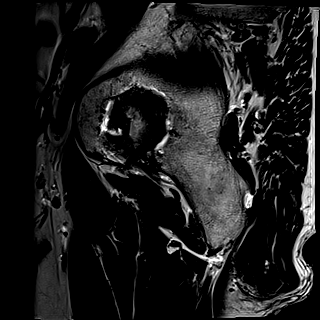

[Series 6: T1 · axial · 3.0mm · 0.31mm/px · 1 of 24 slices shown (2 of 2)]
[im 1/24]
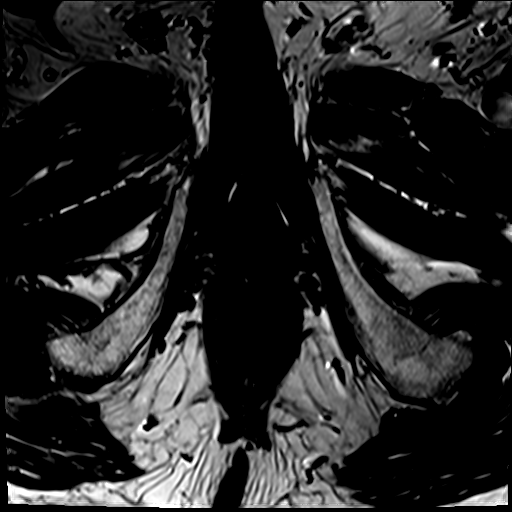

[Series 7: T2 · axial · 3.5mm · 0.56mm/px · 1 of 23 slices shown (2 of 4)]
[im 1/23]
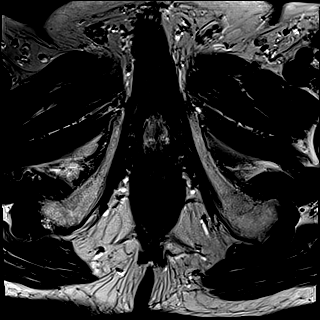

[Series 8: T2 · axial · 1.0mm · 1.04mm/px · 1 of 80 slices shown (3 of 4)]
[im 1/80]
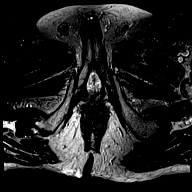

[Series 9: T2 · coronal · 3.5mm · 0.56mm/px · 1 of 23 slices shown (4 of 4)]
[im 1/23]
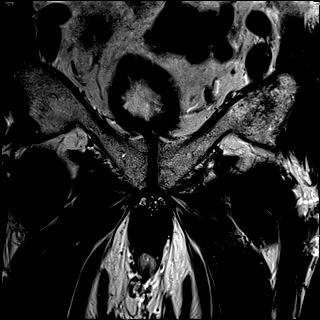

[Series 10: DWI · axial · 3.5mm · 1.56mm/px · 1 of 59 slices shown (1 of 2)]
[im 1/59]
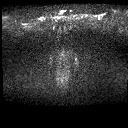

[Series 11: DWI · axial · 3.5mm · 1.56mm/px · 1 of 18 slices shown (2 of 2)]
[im 1/18]
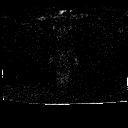

[Series 12: pre t1_twist_tra_dyn_ttc=5.3s · axial · non-contrast · 3.5mm · 0.83mm/px · 1 of 20 slices shown]
[im 1/20]
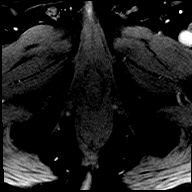

[Series 13: post t1_twist_tra_dyn-copy center · axial · 3.5mm · 0.83mm/px · 1 of 20 slices shown (1 of 24)]
[im 1/20]
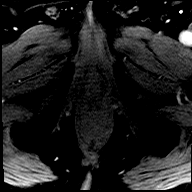

[Series 14: post t1_twist_tra_dyn-copy center · axial · 3.5mm · 0.83mm/px · 1 of 20 slices shown (2 of 24)]
[im 1/20]
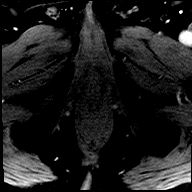

[Series 15: post t1_twist_tra_dyn-copy cent_sub_ttc=(id) · axial · 3.5mm · 0.83mm/px · 1 of 12 slices shown (1 of 22)]
[im 1/12]
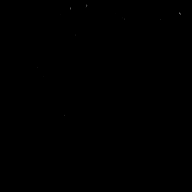

[Series 16: post t1_twist_tra_dyn-copy center · axial · 3.5mm · 0.83mm/px · 1 of 20 slices shown (3 of 24)]
[im 1/20]
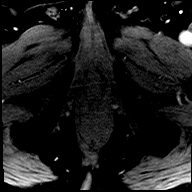

[Series 17: post t1_twist_tra_dyn-copy cent_sub_ttc=(id) · axial · 3.5mm · 0.83mm/px · 1 of 19 slices shown (2 of 22)]
[im 1/19]
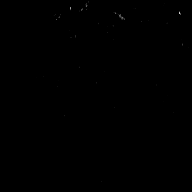

[Series 18: post t1_twist_tra_dyn-copy center · axial · 3.5mm · 0.83mm/px · 1 of 20 slices shown (4 of 24)]
[im 1/20]
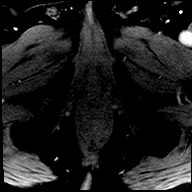

[Series 19: post t1_twist_tra_dyn-copy cent_sub_ttc=(id) · axial · 3.5mm · 0.83mm/px · 1 of 20 slices shown (3 of 22)]
[im 1/20]
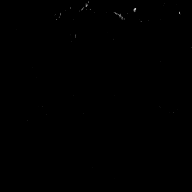

[Series 20: post t1_twist_tra_dyn-copy center · axial · 3.5mm · 0.83mm/px · 1 of 20 slices shown (5 of 24)]
[im 1/20]
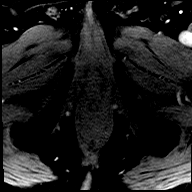

[Series 21: post t1_twist_tra_dyn-copy cent_sub_ttc=(id) · axial · 3.5mm · 0.83mm/px · 1 of 20 slices shown (4 of 22)]
[im 1/20]
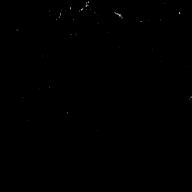

[Series 22: post t1_twist_tra_dyn-copy center · axial · 3.5mm · 0.83mm/px · 1 of 20 slices shown (6 of 24)]
[im 1/20]
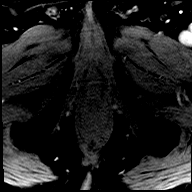

[Series 23: post t1_twist_tra_dyn-copy cent_sub_ttc=(id) · axial · 3.5mm · 0.83mm/px · 1 of 20 slices shown (5 of 22)]
[im 1/20]
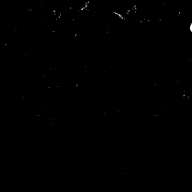

[Series 24: post t1_twist_tra_dyn-copy center · axial · 3.5mm · 0.83mm/px · 1 of 20 slices shown (7 of 24)]
[im 1/20]
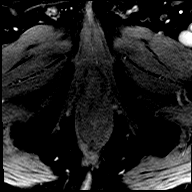

[Series 25: post t1_twist_tra_dyn-copy cent_sub_ttc=(id) · axial · 3.5mm · 0.83mm/px · 1 of 20 slices shown (6 of 22)]
[im 1/20]
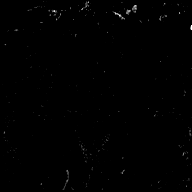

[Series 26: post t1_twist_tra_dyn-copy center · axial · 3.5mm · 0.83mm/px · 1 of 20 slices shown (8 of 24)]
[im 1/20]
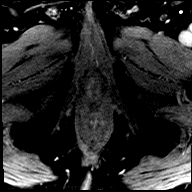

[Series 27: post t1_twist_tra_dyn-copy cent_sub_ttc=(id) · axial · 3.5mm · 0.83mm/px · 1 of 20 slices shown (7 of 22)]
[im 1/20]
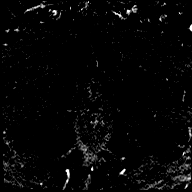

[Series 28: post t1_twist_tra_dyn-copy center · axial · 3.5mm · 0.83mm/px · 1 of 20 slices shown (9 of 24)]
[im 1/20]
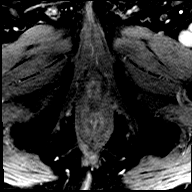

[Series 29: post t1_twist_tra_dyn-copy cent_sub_ttc=(id) · axial · 3.5mm · 0.83mm/px · 1 of 20 slices shown (8 of 22)]
[im 1/20]
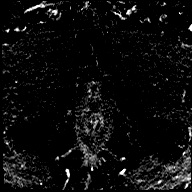

[Series 30: post t1_twist_tra_dyn-copy center · axial · 3.5mm · 0.83mm/px · 1 of 20 slices shown (10 of 24)]
[im 1/20]
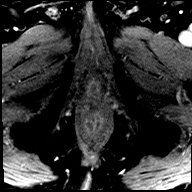

[Series 31: post t1_twist_tra_dyn-copy cent_sub_ttc=(id) · axial · 3.5mm · 0.83mm/px · 1 of 20 slices shown (9 of 22)]
[im 1/20]
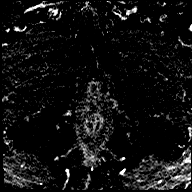

[Series 32: post t1_twist_tra_dyn-copy center · axial · 3.5mm · 0.83mm/px · 1 of 20 slices shown (11 of 24)]
[im 1/20]
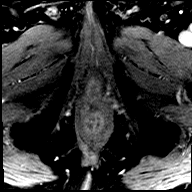

[Series 33: post t1_twist_tra_dyn-copy cent_sub_ttc=(id) · axial · 3.5mm · 0.83mm/px · 1 of 20 slices shown (10 of 22)]
[im 1/20]
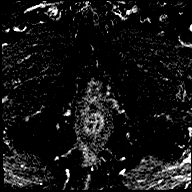

[Series 34: post t1_twist_tra_dyn-copy center · axial · 3.5mm · 0.83mm/px · 1 of 20 slices shown (12 of 24)]
[im 1/20]
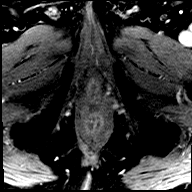

[Series 35: post t1_twist_tra_dyn-copy cent_sub_ttc=(id) · axial · 3.5mm · 0.83mm/px · 1 of 20 slices shown (11 of 22)]
[im 1/20]
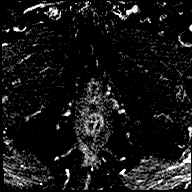

[Series 36: post t1_twist_tra_dyn-copy center · axial · 3.5mm · 0.83mm/px · 1 of 20 slices shown (13 of 24)]
[im 1/20]
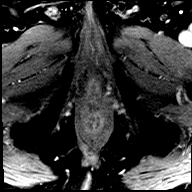

[Series 37: post t1_twist_tra_dyn-copy cent_sub_ttc=(id) · axial · 3.5mm · 0.83mm/px · 1 of 20 slices shown (12 of 22)]
[im 1/20]
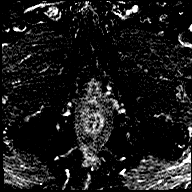

[Series 38: post t1_twist_tra_dyn-copy center · axial · 3.5mm · 0.83mm/px · 1 of 20 slices shown (14 of 24)]
[im 1/20]
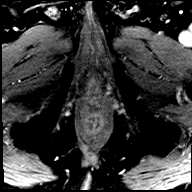

[Series 39: post t1_twist_tra_dyn-copy cent_sub_ttc=(id) · axial · 3.5mm · 0.83mm/px · 1 of 20 slices shown (13 of 22)]
[im 1/20]
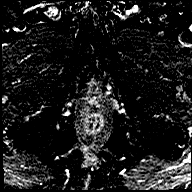

[Series 40: post t1_twist_tra_dyn-copy center · axial · 3.5mm · 0.83mm/px · 1 of 20 slices shown (15 of 24)]
[im 1/20]
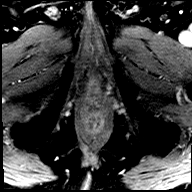

[Series 41: post t1_twist_tra_dyn-copy cent_sub_ttc=(id) · axial · 3.5mm · 0.83mm/px · 1 of 20 slices shown (14 of 22)]
[im 1/20]
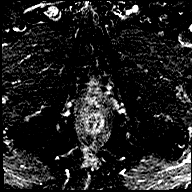

[Series 42: post t1_twist_tra_dyn-copy center · axial · 3.5mm · 0.83mm/px · 1 of 20 slices shown (16 of 24)]
[im 1/20]
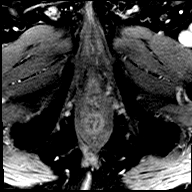

[Series 43: post t1_twist_tra_dyn-copy cent_sub_ttc=(id) · axial · 3.5mm · 0.83mm/px · 1 of 20 slices shown (15 of 22)]
[im 1/20]
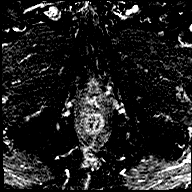

[Series 44: post t1_twist_tra_dyn-copy center · axial · 3.5mm · 0.83mm/px · 1 of 20 slices shown (17 of 24)]
[im 1/20]
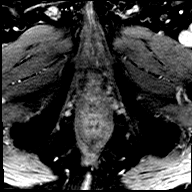

[Series 45: post t1_twist_tra_dyn-copy cent_sub_ttc=(id) · axial · 3.5mm · 0.83mm/px · 1 of 20 slices shown (16 of 22)]
[im 1/20]
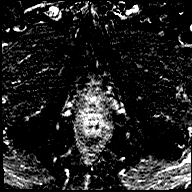

[Series 46: post t1_twist_tra_dyn-copy center · axial · 3.5mm · 0.83mm/px · 1 of 20 slices shown (18 of 24)]
[im 1/20]
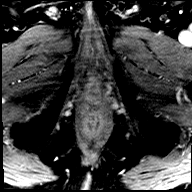

[Series 47: post t1_twist_tra_dyn-copy cent_sub_ttc=(id) · axial · 3.5mm · 0.83mm/px · 1 of 20 slices shown (17 of 22)]
[im 1/20]
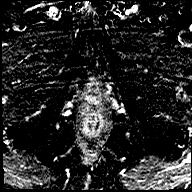

[Series 48: post t1_twist_tra_dyn-copy center · axial · 3.5mm · 0.83mm/px · 1 of 20 slices shown (19 of 24)]
[im 1/20]
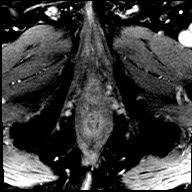

[Series 49: post t1_twist_tra_dyn-copy cent_sub_ttc=(id) · axial · 3.5mm · 0.83mm/px · 1 of 20 slices shown (18 of 22)]
[im 1/20]
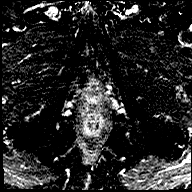

[Series 50: post t1_twist_tra_dyn-copy center · axial · 3.5mm · 0.83mm/px · 1 of 20 slices shown (20 of 24)]
[im 1/20]
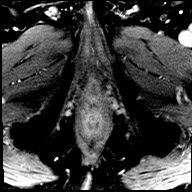

[Series 51: post t1_twist_tra_dyn-copy cent_sub_ttc=(id) · axial · 3.5mm · 0.83mm/px · 1 of 20 slices shown (19 of 22)]
[im 1/20]
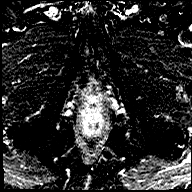

[Series 52: post t1_twist_tra_dyn-copy center · axial · 3.5mm · 0.83mm/px · 1 of 20 slices shown (21 of 24)]
[im 1/20]
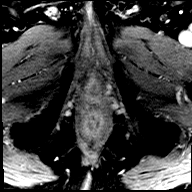

[Series 53: post t1_twist_tra_dyn-copy cent_sub_ttc=(id) · axial · 3.5mm · 0.83mm/px · 1 of 20 slices shown (20 of 22)]
[im 1/20]
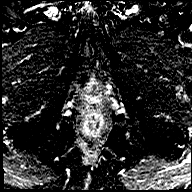

[Series 54: post t1_twist_tra_dyn-copy center · axial · 3.5mm · 0.83mm/px · 1 of 20 slices shown (22 of 24)]
[im 1/20]
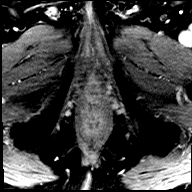

[Series 55: post t1_twist_tra_dyn-copy cent_sub_ttc=(id) · axial · 3.5mm · 0.83mm/px · 1 of 20 slices shown (21 of 22)]
[im 1/20]
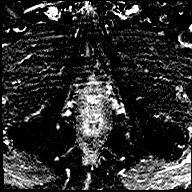

[Series 56: post t1_twist_tra_dyn-copy center · axial · 3.5mm · 0.83mm/px · 1 of 20 slices shown (23 of 24)]
[im 1/20]
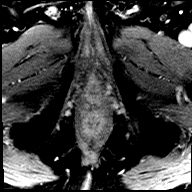

[Series 57: post t1_twist_tra_dyn-copy cent_sub_ttc=(id) · axial · 3.5mm · 0.83mm/px · 1 of 20 slices shown (22 of 22)]
[im 1/20]
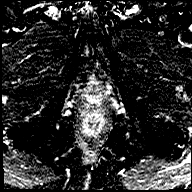

[Series 58: post t1_twist_tra_dyn-copy center · axial · 3.5mm · 0.83mm/px · 1 of 20 slices shown (24 of 24)]
[im 1/20]
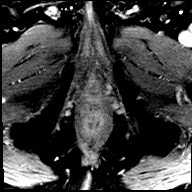

[56 of 56 positions shown; findings below may reference images not displayed]

FINDINGS: Prostate: Heterogeneous signal intensity within the peripheral zone
without focal lesion on T2 weighted imaging. No foci of restricted
diffusion within the peripheral zone.

The transitional zone is enlarged with capsulated nodules. No
suspicious lesions.

There are signal voids within the central gland likely representing
calculi. There several bladder stones noted within the bladder
additionally.

Volume: 4.2 by 4.5 cm by 4.8 cm

Transcapsular spread:  Absent

Seminal vesicle involvement: Absent

Neurovascular bundle involvement: Absent

Pelvic adenopathy: Absent

Bone metastasis: Absent

Other findings: Bladder calculi.  Multiple sigmoid diverticula
IMPRESSION: 1. No evidence of high-grade carcinoma in peripheral zone.
2. Nodular transitional zone consistent benign prostate hypertrophy
(PI rads 1)
3. Bladder calculi and calcifications in the transitional
zone/central gland.

## 2021-05-01 ENCOUNTER — Ambulatory Visit (INDEPENDENT_AMBULATORY_CARE_PROVIDER_SITE_OTHER): Payer: Medicare Other | Admitting: Gastroenterology

## 2021-05-01 ENCOUNTER — Encounter: Payer: Self-pay | Admitting: Gastroenterology

## 2021-05-01 VITALS — BP 110/54 | HR 58 | Ht 72.5 in | Wt 167.4 lb

## 2021-05-01 DIAGNOSIS — Z86010 Personal history of colonic polyps: Secondary | ICD-10-CM

## 2021-05-01 MED ORDER — PLENVU 140 G PO SOLR: 1.0000 | Freq: Once | ORAL | 0 refills | Status: AC

## 2021-05-01 NOTE — Patient Instructions (Signed)
You have been scheduled for a colonoscopy. Please follow written instructions given to you at your visit today.  °Please pick up your prep supplies at the pharmacy within the next 1-3 days. °If you use inhalers (even only as needed), please bring them with you on the day of your procedure. ° °Due to recent changes in healthcare laws, you may see the results of your imaging and laboratory studies on MyChart before your provider has had a chance to review them.  We understand that in some cases there may be results that are confusing or concerning to you. Not all laboratory results come back in the same time frame and the provider may be waiting for multiple results in order to interpret others.  Please give us 48 hours in order for your provider to thoroughly review all the results before contacting the office for clarification of your results.  ° °The [facility] providers would like to encourage you to use MYCHART to communicate with providers for non-urgent requests or questions.  Due to long hold times on the telephone, sending your provider a message by MYCHART may be a faster and more efficient way to get a response.  Please allow 48 business hours for a response.  Please remember that this is for non-urgent requests.  ° °Thank you for choosing me and [facility]. ° °Malcolm T. Stark, Jr., MD., FACG ° °

## 2021-05-01 NOTE — Progress Notes (Signed)
    History of Present Illness: This is an 86 year old male with a history of advanced adenomatous colon polyp.  A 2.5 cm adenomatous polyp was removed in the ascending colon by piecemeal polypectomy in 2016.  Follow-up colonoscopy in 2017 confirmed a complete polypectomy.  His last colonoscopy was performed in May 2020 with 2 small adenomatous colon polyps removed.  He has mild chronic constipation that is well controlled on daily MiraLAX.  He has a history of radiation therapy to the prostate.  His health remains very good and he is very active.  He states he was in [place] a couple weeks ago checking on a rental property.  He plays 18 holes of golf regularly.  He would like to continue surveillance colonoscopies at this time.  Current Medications, Allergies, Past Medical History, Past Surgical History, Family History and Social History were reviewed in [employer] record.   Physical Exam: General: Well developed, well nourished, no acute distress Head: Normocephalic and atraumatic Eyes: Sclerae anicteric, EOMI Ears: Normal auditory acuity Mouth: Not examined, mask on during Covid-19 pandemic Lungs: Clear throughout to auscultation Heart: Regular rate and rhythm; no murmurs, rubs or bruits Abdomen: Soft, non tender and non distended. No masses, hepatosplenomegaly or hernias noted. Normal Bowel sounds Rectal: Deferred to colonoscopy Musculoskeletal: Symmetrical with no gross deformities  Pulses:  Normal pulses noted Extremities: No clubbing, cyanosis, edema or deformities noted Neurological: Alert oriented x 4, grossly nonfocal Psychological:  Alert and cooperative. Normal mood and affect   Assessment and Recommendations:  Personal history of an advanced adenomatous colon polyp requiring piecemeal polypectomy in 2016.  2 small adenomatous colon polyps on last colonoscopy.  He would like to proceed with at least 1 more surveillance colonoscopy and given his above  average health for his age this is reasonable.  Schedule colonoscopy. The risks (including bleeding, perforation, infection, missed lesions, medication reactions and possible hospitalization or surgery if complications occur), benefits, and alternatives to colonoscopy with possible biopsy and possible polypectomy were discussed with the patient and they consent to proceed.   Constipation.  Continue MiraLAX daily.

## 2021-05-21 ENCOUNTER — Other Ambulatory Visit: Payer: Self-pay | Admitting: Family Medicine

## 2021-05-22 ENCOUNTER — Other Ambulatory Visit: Payer: Self-pay

## 2021-05-22 ENCOUNTER — Encounter: Payer: Self-pay | Admitting: Gastroenterology

## 2021-05-22 ENCOUNTER — Telehealth: Payer: Self-pay | Admitting: Family Medicine

## 2021-05-22 ENCOUNTER — Ambulatory Visit (AMBULATORY_SURGERY_CENTER): Payer: Medicare Other | Admitting: Gastroenterology

## 2021-05-22 VITALS — BP 132/68 | HR 57 | Temp 95.5°F | Resp 10 | Ht 72.5 in | Wt 167.0 lb

## 2021-05-22 DIAGNOSIS — D123 Benign neoplasm of transverse colon: Secondary | ICD-10-CM | POA: Diagnosis not present

## 2021-05-22 DIAGNOSIS — D124 Benign neoplasm of descending colon: Secondary | ICD-10-CM | POA: Diagnosis not present

## 2021-05-22 DIAGNOSIS — Z86010 Personal history of colonic polyps: Secondary | ICD-10-CM

## 2021-05-22 MED ORDER — METFORMIN HCL 1000 MG PO TABS: ORAL_TABLET | ORAL | 1 refills | Status: DC

## 2021-05-22 MED ORDER — SIMVASTATIN 10 MG PO TABS: 10.0000 mg | ORAL_TABLET | Freq: Every day | ORAL | 1 refills | Status: DC

## 2021-05-22 MED ORDER — SODIUM CHLORIDE 0.9 % IV SOLN: 500.0000 mL | Freq: Once | INTRAVENOUS | Status: DC

## 2021-05-22 MED ORDER — SITAGLIPTIN PHOSPHATE 100 MG PO TABS: 100.0000 mg | ORAL_TABLET | Freq: Every day | ORAL | 1 refills | Status: DC

## 2021-05-22 NOTE — Progress Notes (Signed)
Called to room to assist during endoscopic procedure.  Patient ID and intended procedure confirmed with present staff. Received instructions for my participation in the procedure from the performing physician.  

## 2021-05-22 NOTE — Telephone Encounter (Signed)
Medication:   JANUVIA 100 MG tablet [[id number]] simvastatin (ZOCOR) 10 MG tablet [[id number]]  metFORMIN (GLUCOPHAGE) 1000 MG tablet [[id number]]   Has the patient contacted their pharmacy? No. (If no, request that the patient contact the pharmacy for the refill.) (If yes, when and what did the pharmacy advise?)     Preferred Pharmacy (with phone number or street name):   [facility], [address]  [place], [place] [address]  Phone:  [phone]  Fax:  [phone]     Agent: Please be advised that RX refills may take up to 3 business days. We ask that you follow-up with your pharmacy.

## 2021-05-22 NOTE — Progress Notes (Signed)
To PACU, VSS. Report to [email or web address] 

## 2021-05-22 NOTE — Progress Notes (Signed)
Pt's states no medical or surgical changes since previsit or office visit. 

## 2021-05-22 NOTE — Telephone Encounter (Signed)
Medication sent.

## 2021-05-22 NOTE — Patient Instructions (Addendum)
YOU HAD AN ENDOSCOPIC PROCEDURE TODAY AT [facility]:   Refer to the procedure report that was given to you for any specific questions about what was found during the examination.  If the procedure report does not answer your questions, please call your gastroenterologist to clarify.  If you requested that your care partner not be given the details of your procedure findings, then the procedure report has been included in a sealed envelope for you to review at your convenience later.  YOU SHOULD EXPECT: Some feelings of bloating in the abdomen. Passage of more gas than usual.  Walking can help get rid of the air that was put into your GI tract during the procedure and reduce the bloating. If you had a lower endoscopy (such as a colonoscopy or flexible sigmoidoscopy) you may notice spotting of blood in your stool or on the toilet paper. If you underwent a bowel prep for your procedure, you may not have a normal bowel movement for a few days.  Please Note:  You might notice some irritation and congestion in your nose or some drainage.  This is from the oxygen used during your procedure.  There is no need for concern and it should clear up in a day or so.  SYMPTOMS TO REPORT IMMEDIATELY:  Following lower endoscopy (colonoscopy or flexible sigmoidoscopy):  Excessive amounts of blood in the stool  Significant tenderness or worsening of abdominal pains  Swelling of the abdomen that is new, acute  Fever of 100F or higher   For urgent or emergent issues, a gastroenterologist can be reached at any hour by calling [phone]. Do not use MyChart messaging for urgent concerns.    DIET:  We do recommend a small meal at first, but then you may proceed to your regular diet.  Drink plenty of fluids but you should avoid alcoholic beverages for 24 hours.  ACTIVITY:  You should plan to take it easy for the rest of today and you should NOT DRIVE or use heavy machinery until tomorrow (because  of the sedation medicines used during the test).    FOLLOW UP: Our staff will call the number listed on your records 48-72 hours following your procedure to check on you and address any questions or concerns that you may have regarding the information given to you following your procedure. If we do not reach you, we will leave a message.  We will attempt to reach you two times.  During this call, we will ask if you have developed any symptoms of COVID 19. If you develop any symptoms (ie: fever, flu-like symptoms, shortness of breath, cough etc.) before then, please call [phone].  If you test positive for Covid 19 in the 2 weeks post procedure, please call and report this information to [place].    If any biopsies were taken you will be contacted by phone or by letter within the next 1-3 weeks.  Please call us at [phone] if you have not heard about the biopsies in 3 weeks.    SIGNATURES/CONFIDENTIALITY: You and/or your care partner have signed paperwork which will be entered into your electronic medical record.  These signatures attest to the fact that that the information above on your After Visit Summary has been reviewed and is understood.  Full responsibility of the confidentiality of this discharge information lies with you and/or your care-partner.    RESUME MEDICATIONS.INFORMATION GIVEN ON POLYPS,DIVERTICULOSIS,HEMORRHOIDS AND HIGH FIBER DIET.

## 2021-05-22 NOTE — Op Note (Signed)
[facility] Patient Name: [name] Procedure Date: 05/22/2021 1:26 PM MRN: [id number] Endoscopist: Ladene Artist , MD Age: 86 Referring MD:  Date of Birth: [birth year] Gender: Male Account #: [id number] Procedure:                Colonoscopy Indications:              Surveillance: Personal history of advanced                            adenomatous polyps on last colonoscopy 2 years ago                            and on colonoscopy in 2016.. Medicines:                Monitored Anesthesia Care Procedure:                Pre-Anesthesia Assessment:                           - Prior to the procedure, a History and Physical                            was performed, and patient medications and                            allergies were reviewed. The patient's tolerance of                            previous anesthesia was also reviewed. The risks                            and benefits of the procedure and the sedation                            options and risks were discussed with the patient.                            All questions were answered, and informed consent                            was obtained. Prior Anticoagulants: The patient has                            taken no previous anticoagulant or antiplatelet                            agents. ASA Grade Assessment: II - A patient with                            mild systemic disease. After reviewing the risks                            and benefits, the patient was deemed in  satisfactory condition to undergo the procedure.                           After obtaining informed consent, the colonoscope                            was passed under direct vision. Throughout the                            procedure, the patient's blood pressure, pulse, and                            oxygen saturations were monitored continuously. The                            CF [id number] #[id number] was introduced  through the anus                            and advanced to the the cecum, identified by                            appendiceal orifice and ileocecal valve. The                            ileocecal valve, appendiceal orifice, and rectum                            were photographed. The quality of the bowel                            preparation was good. The colonoscopy was performed                            without difficulty. The patient tolerated the                            procedure well. Scope In: 1:35:29 PM Scope Out: 1:54:47 PM Scope Withdrawal Time: 0 hours 16 minutes 36 seconds  Total Procedure Duration: 0 hours 19 minutes 18 seconds  Findings:                 The perianal and digital rectal examinations were                            normal.                           Three sessile polyps were found in the descending                            colon and transverse colon. The polyps were 5 to 6                            mm in size. These polyps were removed with a cold  snare. Resection and retrieval were complete.                           Multiple medium-mouthed diverticula were found in                            the left colon. There was evidence of diverticular                            spasm. There was no evidence of diverticular                            bleeding.                           Internal hemorrhoids were found during                            retroflexion. The hemorrhoids were moderate and                            Grade I (internal hemorrhoids that do not prolapse).                           The exam was otherwise without abnormality on                            direct and retroflexion views. Complications:            No immediate complications. Estimated blood loss:                            None. Estimated Blood Loss:     Estimated blood loss: none. Impression:               - Three 5 to 6 mm polyps in the descending  colon                            and in the transverse colon, removed with a cold                            snare. Resected and retrieved.                           - Moderate diverticulosis in the left colon.                           - Internal hemorrhoids.                           - The examination was otherwise normal on direct                            and retroflexion views. Recommendation:           - Patient has a contact number available for  emergencies. The signs and symptoms of potential                            delayed complications were discussed with the                            patient. Return to normal activities tomorrow.                            Written discharge instructions were provided to the                            patient.                           - High fiber diet.                           - Continue present medications.                           - Await pathology results.                           - No repeat colonoscopy is planned due to age. Ladene Artist, MD 05/22/2021 2:00:00 PM This report has been signed electronically.

## 2021-05-22 NOTE — Progress Notes (Signed)
See 05/01/2021 H&P, no changes.

## 2021-05-22 NOTE — Telephone Encounter (Signed)
Error

## 2021-05-24 ENCOUNTER — Telehealth: Payer: Self-pay

## 2021-05-24 NOTE — Telephone Encounter (Signed)
  Follow up Call-  Call back number 05/22/2021 11/16/2018  Post procedure Call Back phone  # [phone]  Permission to leave phone message - Yes  Some recent data might be hidden     Patient questions:  Do you have a fever, pain , or abdominal swelling? No. Pain Score  0 *  Have you tolerated food without any problems? Yes.    Have you been able to return to your normal activities? Yes.    Do you have any questions about your discharge instructions: Diet   No. Medications  No. Follow up visit  No.  Do you have questions or concerns about your Care? No.  Actions: * If pain score is 4 or above: No action needed, pain <4.

## 2021-06-04 ENCOUNTER — Encounter: Payer: Self-pay | Admitting: Gastroenterology

## 2021-06-22 ENCOUNTER — Encounter: Payer: Self-pay | Admitting: Family Medicine

## 2021-06-24 ENCOUNTER — Telehealth: Payer: Self-pay

## 2021-06-24 ENCOUNTER — Other Ambulatory Visit: Payer: Self-pay

## 2021-06-24 DIAGNOSIS — E782 Mixed hyperlipidemia: Secondary | ICD-10-CM

## 2021-06-24 DIAGNOSIS — D649 Anemia, unspecified: Secondary | ICD-10-CM

## 2021-06-24 DIAGNOSIS — R300 Dysuria: Secondary | ICD-10-CM

## 2021-06-24 NOTE — Telephone Encounter (Signed)
Called pt to schedule lab only appointment

## 2021-06-25 NOTE — Telephone Encounter (Signed)
Lab appointment made for 12/21.

## 2021-06-26 ENCOUNTER — Other Ambulatory Visit: Payer: Self-pay

## 2021-06-26 ENCOUNTER — Other Ambulatory Visit (INDEPENDENT_AMBULATORY_CARE_PROVIDER_SITE_OTHER): Payer: Medicare Other

## 2021-06-26 DIAGNOSIS — E782 Mixed hyperlipidemia: Secondary | ICD-10-CM | POA: Diagnosis not present

## 2021-06-26 DIAGNOSIS — E119 Type 2 diabetes mellitus without complications: Secondary | ICD-10-CM

## 2021-06-26 DIAGNOSIS — D649 Anemia, unspecified: Secondary | ICD-10-CM

## 2021-06-26 DIAGNOSIS — R300 Dysuria: Secondary | ICD-10-CM

## 2021-06-26 LAB — LIPID PANEL
Cholesterol: 171 mg/dL (ref 0–200)
HDL: 64.7 mg/dL (ref >39.00)
LDL Cholesterol: 85 mg/dL (ref 0–99)
NonHDL: 106.35
Total CHOL/HDL Ratio: 3
Triglycerides: 105 mg/dL (ref 0.0–149.0)
VLDL: 21 mg/dL (ref 0.0–40.0)

## 2021-06-26 LAB — URINALYSIS, ROUTINE W REFLEX MICROSCOPIC
Bilirubin Urine: NEGATIVE
Ketones, ur: NEGATIVE
Leukocytes,Ua: NEGATIVE
Nitrite: NEGATIVE
Specific Gravity, Urine: 1.015 (ref 1.000–1.030)
Total Protein, Urine: NEGATIVE
Urine Glucose: >=1000 — AB
Urobilinogen, UA: 0.2 (ref 0.0–1.0)
pH: 5.5 (ref 5.0–8.0)

## 2021-06-26 LAB — CBC WITH DIFFERENTIAL/PLATELET
Basophils Absolute: 0 10*3/uL (ref 0.0–0.1)
Basophils Relative: 0.4 % (ref 0.0–3.0)
Eosinophils Absolute: 0.3 10*3/uL (ref 0.0–0.7)
Eosinophils Relative: 4.6 % (ref 0.0–5.0)
HCT: 40.6 % (ref 39.0–52.0)
Hemoglobin: 13.6 g/dL (ref 13.0–17.0)
Lymphocytes Relative: 15.9 % (ref 12.0–46.0)
Lymphs Abs: 1.1 10*3/uL (ref 0.7–4.0)
MCHC: 33.5 g/dL (ref 30.0–36.0)
MCV: 96.1 fl (ref 78.0–100.0)
Monocytes Absolute: 0.9 10*3/uL (ref 0.1–1.0)
Monocytes Relative: 13.5 % — ABNORMAL HIGH (ref 3.0–12.0)
Neutro Abs: 4.5 10*3/uL (ref 1.4–7.7)
Neutrophils Relative %: 65.6 % (ref 43.0–77.0)
Platelets: 204 10*3/uL (ref 150.0–400.0)
RBC: 4.23 Mil/uL (ref 4.22–5.81)
RDW: 13.8 % (ref 11.5–15.5)
WBC: 6.8 10*3/uL (ref 4.0–10.5)

## 2021-06-26 LAB — COMPREHENSIVE METABOLIC PANEL
ALT: 15 U/L (ref 0–53)
AST: 14 U/L (ref 0–37)
Albumin: 4.1 g/dL (ref 3.5–5.2)
Alkaline Phosphatase: 59 U/L (ref 39–117)
BUN: 20 mg/dL (ref 6–23)
CO2: 27 mEq/L (ref 19–32)
Calcium: 9.3 mg/dL (ref 8.4–10.5)
Chloride: 105 mEq/L (ref 96–112)
Creatinine, Ser: 1.47 mg/dL (ref 0.40–1.50)
GFR: 43.55 mL/min — ABNORMAL LOW (ref >60.00)
Glucose, Bld: 153 mg/dL — ABNORMAL HIGH (ref 70–99)
Potassium: 4.5 mEq/L (ref 3.5–5.1)
Sodium: 139 mEq/L (ref 135–145)
Total Bilirubin: 0.4 mg/dL (ref 0.2–1.2)
Total Protein: 6.4 g/dL (ref 6.0–8.3)

## 2021-06-26 LAB — HEMOGLOBIN A1C: Hgb A1c MFr Bld: 7.2 % — ABNORMAL HIGH (ref 4.6–6.5)

## 2021-06-26 MED ORDER — CEFDINIR 300 MG PO CAPS: 300.0000 mg | ORAL_CAPSULE | Freq: Two times a day (BID) | ORAL | 0 refills | Status: AC

## 2021-06-27 ENCOUNTER — Encounter: Payer: Self-pay | Admitting: Family Medicine

## 2021-06-27 ENCOUNTER — Other Ambulatory Visit: Payer: Self-pay

## 2021-06-27 MED ORDER — EMPAGLIFLOZIN 10 MG PO TABS: 10.0000 mg | ORAL_TABLET | Freq: Every day | ORAL | 1 refills | Status: DC

## 2021-06-28 LAB — URINE CULTURE
MICRO NUMBER:: 12785581
SPECIMEN QUALITY:: ADEQUATE

## 2021-07-02 ENCOUNTER — Other Ambulatory Visit: Payer: Self-pay | Admitting: Family Medicine

## 2021-07-02 DIAGNOSIS — R319 Hematuria, unspecified: Secondary | ICD-10-CM

## 2021-07-02 NOTE — Progress Notes (Unsigned)
Amb

## 2021-07-03 NOTE — Telephone Encounter (Signed)
Pt wouldn't like the referral the referral can be cancelled

## 2021-07-18 ENCOUNTER — Encounter: Payer: Self-pay | Admitting: Family Medicine

## 2021-07-18 ENCOUNTER — Other Ambulatory Visit: Payer: Self-pay | Admitting: Family Medicine

## 2021-07-18 DIAGNOSIS — E119 Type 2 diabetes mellitus without complications: Secondary | ICD-10-CM

## 2021-07-24 ENCOUNTER — Encounter: Payer: Self-pay | Admitting: Podiatrist

## 2021-07-24 ENCOUNTER — Ambulatory Visit (INDEPENDENT_AMBULATORY_CARE_PROVIDER_SITE_OTHER): Payer: Medicare Other | Admitting: Podiatrist

## 2021-07-24 ENCOUNTER — Other Ambulatory Visit: Payer: Self-pay

## 2021-07-24 DIAGNOSIS — B351 Tinea unguium: Secondary | ICD-10-CM | POA: Diagnosis not present

## 2021-07-24 DIAGNOSIS — L603 Nail dystrophy: Secondary | ICD-10-CM

## 2021-07-24 MED ORDER — CICLOPIROX 8 % EX SOLN: Freq: Every day | CUTANEOUS | 0 refills | Status: DC

## 2021-07-24 NOTE — Patient Instructions (Addendum)
Diabetes Mellitus and Foot Care Foot care is an important part of your health, especially when you have diabetes. Diabetes may cause you to have problems because of poor blood flow (circulation) to your feet and legs, which can cause your skin to: Become thinner and drier. Break more easily. Heal more slowly. Peel and crack. You may also have nerve damage (neuropathy) in your legs and feet, causing decreased feeling in them. This means that you may not notice minor injuries to your feet that could lead to more serious problems. Noticing and addressing any potential problems early is the best way to prevent future foot problems. How to care for your feet Foot hygiene  Wash your feet daily with warm water and mild soap. Do not use hot water. Then, pat your feet and the areas between your toes until they are completely dry. Do not soak your feet as this can dry your skin. Trim your toenails straight across. Do not dig under them or around the cuticle. File the edges of your nails with an emery board or nail file. Apply a moisturizing lotion or petroleum jelly to the skin on your feet and to dry, brittle toenails. Use lotion that does not contain alcohol and is unscented. Do not apply lotion between your toes. Shoes and socks Wear clean socks or stockings every day. Make sure they are not too tight. Do not wear knee-high stockings since they may decrease blood flow to your legs. Wear shoes that fit properly and have enough cushioning. Always look in your shoes before you put them on to be sure there are no objects inside. To break in new shoes, wear them for just a few hours a day. This prevents injuries on your feet. Wounds, scrapes, corns, and calluses  Check your feet daily for blisters, cuts, bruises, sores, and redness. If you cannot see the bottom of your feet, use a mirror or ask someone for help. Do not cut corns or calluses or try to remove them with medicine. If you find a minor scrape,  cut, or break in the skin on your feet, keep it and the skin around it clean and dry. You may clean these areas with mild soap and water. Do not clean the area with peroxide, alcohol, or iodine. If you have a wound, scrape, corn, or callus on your foot, look at it several times a day to make sure it is healing and not infected. Check for: Redness, swelling, or pain. Fluid or blood. Warmth. Pus or a bad smell. General tips Do not cross your legs. This may decrease blood flow to your feet. Do not use heating pads or hot water bottles on your feet. They may burn your skin. If you have lost feeling in your feet or legs, you may not know this is happening until it is too late. Protect your feet from hot and cold by wearing shoes, such as at the beach or on hot pavement. Schedule a complete foot exam at least once a year (annually) or more often if you have foot problems. Report any cuts, sores, or bruises to your health care provider immediately. Where to find more information American Diabetes Association: www.diabetes.org Association of Diabetes Care & Education Specialists: www.diabeteseducator.org Contact a health care provider if: You have a medical condition that increases your risk of infection and you have any cuts, sores, or bruises on your feet. You have an injury that is not healing. You have redness on your legs or feet. You  feel burning or tingling in your legs or feet. You have pain or cramps in your legs and feet. Your legs or feet are numb. Your feet always feel cold. You have pain around any toenails. Get help right away if: You have a wound, scrape, corn, or callus on your foot and: You have pain, swelling, or redness that gets worse. You have fluid or blood coming from the wound, scrape, corn, or callus. Your wound, scrape, corn, or callus feels warm to the touch. You have pus or a bad smell coming from the wound, scrape, corn, or callus. You have a fever. You have a red  line going up your leg. Summary Check your feet every day for blisters, cuts, bruises, sores, and redness. Apply a moisturizing lotion or petroleum jelly to the skin on your feet and to dry, brittle toenails. Wear shoes that fit properly and have enough cushioning. If you have foot problems, report any cuts, sores, or bruises to your health care provider immediately. Schedule a complete foot exam at least once a year (annually) or more often if you have foot problems. This information is not intended to replace advice given to you by your health care provider. Make sure you discuss any questions you have with your health care provider.  Terbinafine Tablets What is this medication? TERBINAFINE (TER bin a feen) treats fungal infections of the nails. It belongs to a group of medications called antifungals. It will not treat infections caused by bacteria or viruses. This medicine may be used for other purposes; ask your health care provider or pharmacist if you have questions. COMMON BRAND NAME(S): Lamisil, Terbinex What should I tell my care team before I take this medication? They need to know if you have any of these conditions: Liver disease An unusual or allergic reaction to terbinafine, other medications, foods, dyes, or preservatives Pregnant or trying to get pregnant Breast-feeding How should I use this medication? Take this medication by mouth with water. Take it as directed on the prescription label at the same time every day. You can take it with or without food. If it upsets your stomach, take it with food. Keep taking it unless your care team tells you to stop. A special MedGuide will be given to you by the pharmacist with each prescription and refill. Be sure to read this information carefully each time. Talk to your care team regarding the use of this medication in children. Special care may be needed. Overdosage: If you think you have taken too much of this medicine contact a  poison control center or emergency room at once. NOTE: This medicine is only for you. Do not share this medicine with others. What if I miss a dose? If you miss a dose, take it as soon as you can unless it is more than 4 hours late. If it is more than 4 hours late, skip the missed dose. Take the next dose at the normal time. What may interact with this medication? Do not take this medication with any of the following: Pimozide Thioridazine This medication may also interact with the following: Beta blockers Caffeine Certain medications for mental health conditions Cimetidine Cyclosporine Medications for fungal infections like fluconazole and ketoconazole Medications for irregular heartbeat like amiodarone, flecainide and propafenone Rifampin Warfarin This list may not describe all possible interactions. Give your health care provider a list of all the medicines, herbs, non-prescription drugs, or dietary supplements you use. Also tell them if you smoke, drink alcohol, or use  illegal drugs. Some items may interact with your medicine. What should I watch for while using this medication? Visit your care team for regular checks on your progress. You may need blood work while you are taking this medication. It may be some time before you see the benefit from this medication. This medication may cause serious skin reactions. They can happen weeks to months after starting the medication. Contact your care team right away if you notice fevers or flu-like symptoms with a rash. The rash may be red or purple and then turn into blisters or peeling of the skin. Or, you might notice a red rash with swelling of the face, lips or lymph nodes in your neck or under your arms. This medication can make you more sensitive to the sun. Keep out of the sun, If you cannot avoid being in the sun, wear protective clothing and sunscreen. Do not use sun lamps or tanning beds/booths. What side effects may I notice from  receiving this medication? Side effects that you should report to your care team as soon as possible: Allergic reactions--skin rash, itching, hives, swelling of the face, lips, tongue, or throat Change in sense of smell Change in taste Infection--fever, chills, cough, or sore throat Liver injury--right upper belly pain, loss of appetite, nausea, light-colored stool, dark yellow or brown urine, yellowing skin or eyes, unusual weakness or fatigue Low red blood cell level--unusual weakness or fatigue, dizziness, headache, trouble breathing Lupus-like syndrome--joint pain, swelling, or stiffness, butterfly-shaped rash on the face, rashes that get worse in the sun, fever, unusual weakness or fatigue Rash, fever, and swollen lymph nodes Redness, blistering, peeling, or loosening of the skin, including inside the mouth Unusual bruising or bleeding Worsening mood, feelings of depression Side effects that usually do not require medical attention (report to your care team if they continue or are bothersome): Diarrhea Gas Headache Nausea Stomach pain Upset stomach This list may not describe all possible side effects. Call your doctor for medical advice about side effects. You may report side effects to FDA at 1-800-FDA-1088. Where should I keep my medication? Keep out of the reach of children and pets. Store between 20 and 25 degrees C (68 and 77 degrees F). Protect from light. Get rid of any unused medication after the expiration date. To get rid of medications that are no longer needed or have expired: Take the medication to a medication take-back program. Check with your pharmacy or law enforcement to find a location. If you cannot return the medication, check the label or package insert to see if the medication should be thrown out in the garbage or flushed down the toilet. If you are not sure, ask your care team. If it is safe to put it in the trash, take the medication out of the container. Mix  the medication with cat litter, dirt, coffee grounds, or other unwanted substance. Seal the mixture in a bag or container. Put it in the trash. NOTE: This sheet is a summary. It may not cover all possible information. If you have questions about this medicine, talk to your doctor, pharmacist, or health care provider.  2022 Elsevier/Gold Standard (2021-02-06 00:00:00)  Document Revised: 01/12/2020 Document Reviewed: 01/12/2020 Elsevier Patient Education  [place].

## 2021-07-24 NOTE — Progress Notes (Signed)
Chief Complaint  Patient presents with   Diabetes    Yearly diabetic foot exam. Pt wants to establish podiatry care. No current issues.    Nail Problem    RFC Bilateral nails trim.Possible nail trauma of right great toe nail. Per pt. Nail is not growing.     HPI: Patient is 87 y.o. male who presents today for a diabetic foot exam.  He relates his toenails have become thicker and the right great toenail in particular has slowed in its growth. He wants to be sure there are no circulatory problems.  He relates no symptoms of neuropathy-  no numbness, tinging, shooting pain reported in feet or legs.  Overall he states his blood sugar is under good control for the past 25 years and todays blood sugar reading was 130.   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   S/P hip replacement, left 11/15/2019   Urinary frequency 06/20/2019   Malignant neoplasm of prostate ([place]) 03/15/2019   Medicare annual wellness visit, subsequent 12/31/2015   Onychomycosis 09/17/2015   History of colonic polyps 12/04/2014   Hearing loss 11/23/2013   Sinusitis 09/12/2013   BPH (benign prostatic hyperplasia) 08/07/2013   Vasomotor rhinitis 05/02/2013   Left hip pain 03/14/2012   Back pain 03/14/2012   Pain in finger of left hand 08/18/2011   Diabetes mellitus type 2 in nonobese ([place]) 06/19/2009   Hyperlipidemia 06/19/2009   Glaucoma (increased eye pressure) 06/19/2009    Current Outpatient Medications on File Prior to Visit  Medication Sig Dispense Refill   alfuzosin (UROXATRAL) 10 MG 24 hr tablet Take 10 mg by mouth daily.     brimonidine-timolol (COMBIGAN) 0.2-0.5 % ophthalmic solution Place 1 drop into the right eye 2 (two) times daily.      brinzolamide (AZOPT) 1 % ophthalmic suspension Place 1 drop into the right eye 3 (three) times daily.     cholecalciferol (VITAMIN D3) 25 MCG (1000 UNIT) tablet Take 1,000 Units by mouth daily.      empagliflozin (JARDIANCE) 10 MG TABS tablet Take 1 tablet (10 mg total) by mouth daily. 90 tablet 1   famotidine (PEPCID) 20 MG tablet TAKE 1 TABLET DAILY AS NEEDED FOR HEARTBURN OR INDIGESTION 90 tablet 1   fluticasone (FLONASE) 50 MCG/ACT nasal spray Place 2 sprays into both nostrils daily. 16 g 1   glucose blood (TRUE METRIX BLOOD GLUCOSE TEST) test strip USE TO TEST BLOOD SUGAR ONCE DAILY 100 strip 12   glyBURIDE (DIABETA) 5 MG tablet TAKE 2 TABLETS TWICE DAILY 360 tablet 1   ipratropium (ATROVENT) 0.03 % nasal spray Place 2 sprays into both nostrils 2 (two) times daily as needed for rhinitis. 90 mL 3   metFORMIN (GLUCOPHAGE) 1000 MG tablet TAKE 1 TABLET BY MOUTH IN MORNING, AND TAKE 1/2 TABLET BY MOUTH AT NOON AND TAKE 1 TABLET BY MOUTH IN THE EVENING 225 tablet 1   Multiple Vitamin (MULTIVITAMIN WITH MINERALS) TABS tablet Take 1 tablet by mouth daily.     simvastatin (ZOCOR) 10 MG tablet Take 1 tablet (10 mg total) by mouth at bedtime. 90 tablet 1   sitaGLIPtin (JANUVIA) 100 MG tablet Take 1 tablet (100 mg total) by mouth daily. 90 tablet 1   travoprost, benzalkonium, (TRAVATAN) 0.004 % ophthalmic solution Place 1 drop into the right eye at bedtime.     valACYclovir (VALTREX) 1000 MG tablet TAKE 1 TABLET DAILY 90 tablet 3  [employer] LANCETS MISC Use as directed once daily to check blood sugar.  DX E11.9 100 each 6   No current facility-administered medications on file prior to visit.    Allergies  Allergen Reactions   Levaquin [Levofloxacin In D5w] Hives    Review of Systems No fevers, chills, nausea, muscle aches, no difficulty breathing, no calf pain, no chest pain or shortness of breath.   Physical Exam  GENERAL APPEARANCE: Alert, conversant. Appropriately groomed. No acute distress.   VASCULAR: Pedal pulses palpable 2/4 DP and 1/4 PT bilateral.  Capillary refill time is immediate to all digits,  Proximal to distal cooling it warm to warm.  Digital perfusion adequate.    NEUROLOGIC: sensation is intact to 5.07 monofilament at 5/5 sites bilateral.  Light touch is intact bilateral, vibratory sensation decreased bilateral  MUSCULOSKELETAL: acceptable muscle strength, tone and stability bilateral.  Prominent fifth metatarsal base present bilateral.  Fat pad atrophy at the forefoot noted bilateral.   No pain, crepitus or limitation noted with foot and ankle range of motion bilateral.  Contracture of the left second digit forming.     DERMATOLOGIC: skin is warm, supple, and dry.  No open lesions noted.  No rash, no pre ulcerative lesions. Digital nails are yellowish in discoloration with some subungual debris noted especially the left hallux  nail. Nails are brittle and friable as well consistent with onychomycosis.   Assessment     ICD-10-CM   1. Dystrophic nail  L60.3     2. Onychomycosis  B35.1        Plan  Exam findings were discussed with the patient.  Discussed the nail on the right hallux is likely growing slower due to fungus being present.  I did take a sample of the left hallux nail to send for fungal culture.  I will notify him of the result as it becomes available.  In the meantime I debrided all nails in thickness and length today.  Recommended that he try Penlac nail lacquer.  Depending upon the fungal culture result he may want to consider Lamisil therapy.  Information about Lamisil was dispensed for his use and I discussed the requirement of liver function testing.  He will also be seen back in 3 months for routine diabetic foot care and if any problems arise prior to that visit he is instructed to call.

## 2021-08-07 ENCOUNTER — Encounter: Payer: Self-pay | Admitting: Podiatrist

## 2021-08-19 ENCOUNTER — Other Ambulatory Visit: Payer: Self-pay | Admitting: Family Medicine

## 2021-08-30 ENCOUNTER — Other Ambulatory Visit: Payer: Self-pay | Admitting: Urology

## 2021-09-25 LAB — PSA: PSA: 0.19

## 2021-09-27 ENCOUNTER — Other Ambulatory Visit: Payer: Self-pay

## 2021-09-30 ENCOUNTER — Telehealth: Payer: Self-pay | Admitting: Family Medicine

## 2021-09-30 NOTE — Telephone Encounter (Signed)
Patient states he is overdue for his 6 month f/u for his blood check. An appointment was made for 04/11, but he would like to come in the week before to get his blood work done first. Please advise.  ?

## 2021-10-01 ENCOUNTER — Other Ambulatory Visit: Payer: Self-pay

## 2021-10-01 DIAGNOSIS — E782 Mixed hyperlipidemia: Secondary | ICD-10-CM

## 2021-10-01 DIAGNOSIS — D649 Anemia, unspecified: Secondary | ICD-10-CM

## 2021-10-01 DIAGNOSIS — E119 Type 2 diabetes mellitus without complications: Secondary | ICD-10-CM

## 2021-10-01 DIAGNOSIS — Z0000 Encounter for general adult medical examination without abnormal findings: Secondary | ICD-10-CM

## 2021-10-01 NOTE — Telephone Encounter (Signed)
Lab appt made, orders entered.  ?

## 2021-10-02 LAB — HM DIABETES EYE EXAM

## 2021-10-04 ENCOUNTER — Encounter (HOSPITAL_BASED_OUTPATIENT_CLINIC_OR_DEPARTMENT_OTHER): Payer: Self-pay | Admitting: Urology

## 2021-10-04 ENCOUNTER — Other Ambulatory Visit: Payer: Self-pay

## 2021-10-04 NOTE — Progress Notes (Signed)
Spoke w/ via phone for pre-op interview---pt  ?Lab needs dos----  EKG and I stat             ?Lab results------none ?COVID test -----patient states asymptomatic no test needed ?Arrive at -------0715 ? ?NPO after MN NO Solid Food.  Clear liquids from MN until--0615- ?Med rec completed ?Medications to take morning of surgery -----atrovent, combigan, azopt and travatan ?Diabetic medication -----Hold all 4 of diabetic meds morning of surgery ?Patient instructed no nail polish to be worn day of surgery ?Patient instructed to bring photo id and insurance card day of surgery ?Patient aware to have Driver (ride ) / caregiver wife Stanton Kidney    for 24 hours after surgery  ?Patient Special Instructions -----n/a ?Pre-Op special Istructions -----n/a ?Patient verbalized understanding of instructions that were given at this phone interview. ?Patient denies shortness of breath, chest pain, fever, cough at this phone interview.  ?

## 2021-10-08 ENCOUNTER — Other Ambulatory Visit (INDEPENDENT_AMBULATORY_CARE_PROVIDER_SITE_OTHER): Payer: Medicare Other

## 2021-10-08 DIAGNOSIS — D649 Anemia, unspecified: Secondary | ICD-10-CM

## 2021-10-08 DIAGNOSIS — E782 Mixed hyperlipidemia: Secondary | ICD-10-CM

## 2021-10-08 DIAGNOSIS — E119 Type 2 diabetes mellitus without complications: Secondary | ICD-10-CM | POA: Diagnosis not present

## 2021-10-08 DIAGNOSIS — Z0000 Encounter for general adult medical examination without abnormal findings: Secondary | ICD-10-CM | POA: Diagnosis not present

## 2021-10-08 LAB — LIPID PANEL
Cholesterol: 168 mg/dL (ref 0–200)
HDL: 62.9 mg/dL (ref >39.00)
LDL Cholesterol: 82 mg/dL (ref 0–99)
NonHDL: 105.12
Total CHOL/HDL Ratio: 3
Triglycerides: 118 mg/dL (ref 0.0–149.0)
VLDL: 23.6 mg/dL (ref 0.0–40.0)

## 2021-10-08 LAB — CBC WITH DIFFERENTIAL/PLATELET
Basophils Absolute: 0.1 10*3/uL (ref 0.0–0.1)
Basophils Relative: 0.7 % (ref 0.0–3.0)
Eosinophils Absolute: 0.3 10*3/uL (ref 0.0–0.7)
Eosinophils Relative: 4.7 % (ref 0.0–5.0)
HCT: 38.4 % — ABNORMAL LOW (ref 39.0–52.0)
Hemoglobin: 12.7 g/dL — ABNORMAL LOW (ref 13.0–17.0)
Lymphocytes Relative: 14.8 % (ref 12.0–46.0)
Lymphs Abs: 1.1 10*3/uL (ref 0.7–4.0)
MCHC: 33.2 g/dL (ref 30.0–36.0)
MCV: 96.4 fl (ref 78.0–100.0)
Monocytes Absolute: 0.9 10*3/uL (ref 0.1–1.0)
Monocytes Relative: 11.9 % (ref 3.0–12.0)
Neutro Abs: 5 10*3/uL (ref 1.4–7.7)
Neutrophils Relative %: 67.9 % (ref 43.0–77.0)
Platelets: 204 10*3/uL (ref 150.0–400.0)
RBC: 3.98 Mil/uL — ABNORMAL LOW (ref 4.22–5.81)
RDW: 13.9 % (ref 11.5–15.5)
WBC: 7.3 10*3/uL (ref 4.0–10.5)

## 2021-10-08 LAB — COMPREHENSIVE METABOLIC PANEL
ALT: 13 U/L (ref 0–53)
AST: 15 U/L (ref 0–37)
Albumin: 4.2 g/dL (ref 3.5–5.2)
Alkaline Phosphatase: 59 U/L (ref 39–117)
BUN: 22 mg/dL (ref 6–23)
CO2: 25 mEq/L (ref 19–32)
Calcium: 9 mg/dL (ref 8.4–10.5)
Chloride: 106 mEq/L (ref 96–112)
Creatinine, Ser: 1.37 mg/dL (ref 0.40–1.50)
GFR: 47.3 mL/min — ABNORMAL LOW (ref >60.00)
Glucose, Bld: 157 mg/dL — ABNORMAL HIGH (ref 70–99)
Potassium: 4.2 mEq/L (ref 3.5–5.1)
Sodium: 138 mEq/L (ref 135–145)
Total Bilirubin: 0.4 mg/dL (ref 0.2–1.2)
Total Protein: 6.1 g/dL (ref 6.0–8.3)

## 2021-10-08 LAB — HEMOGLOBIN A1C: Hgb A1c MFr Bld: 7.6 % — ABNORMAL HIGH (ref 4.6–6.5)

## 2021-10-09 ENCOUNTER — Encounter (HOSPITAL_BASED_OUTPATIENT_CLINIC_OR_DEPARTMENT_OTHER): Payer: Self-pay | Admitting: Urology

## 2021-10-09 NOTE — Anesthesia Preprocedure Evaluation (Addendum)
Anesthesia Evaluation  ?Patient identified by MRN, date of birth, ID band ?Patient awake ? ? ? ?Reviewed: ?Allergy & Precautions, NPO status , Patient's Chart, lab work & pertinent test results ? ?History of Anesthesia Complications ?(+) history of anesthetic complications ? ?Airway ?Mallampati: II ? ?TM Distance: >3 FB ?Neck ROM: Full ? ? ? Dental ?no notable dental hx. ?(+) Teeth Intact ?  ?Pulmonary ?neg pulmonary ROS,  ?  ?Pulmonary exam normal ?breath sounds clear to auscultation ? ? ? ? ? ? Cardiovascular ?negative cardio ROS ?Normal cardiovascular exam ?Rhythm:Regular Rate:Normal ? ? ?  ?Neuro/Psych ?negative neurological ROS ? negative psych ROS  ? GI/Hepatic ?Neg liver ROS, GERD  Medicated,  ?Endo/Other  ?diabetes, Well Controlled, Type 2, Oral Hypoglycemic AgentsHyperlipidemia ? Renal/GU ?negative Renal ROS  ? ?Bladder calculi ? ?  ?Musculoskeletal ? ?(+) Arthritis , Osteoarthritis,   ? Abdominal ?  ?Peds ? Hematology ?negative hematology ROS ?(+)   ?Anesthesia Other Findings ? ? Reproductive/Obstetrics ? ?  ? ? ? ? ? ? ? ? ? ? ? ? ? ?  ?  ? ? ? ? ? ? ? ?Anesthesia Physical ?Anesthesia Plan ? ?ASA: 3 ? ?Anesthesia Plan: General  ? ?Post-op Pain Management: Minimal or no pain anticipated  ? ?Induction: Intravenous ? ?PONV Risk Score and Plan: 3 and Treatment may vary due to age or medical condition and Ondansetron ? ?Airway Management Planned: LMA and Oral ETT ? ?Additional Equipment: None ? ?Intra-op Plan:  ? ?Post-operative Plan: Extubation in OR ? ?Informed Consent: I have reviewed the patients History and Physical, chart, labs and discussed the procedure including the risks, benefits and alternatives for the proposed anesthesia with the patient or authorized representative who has indicated his/her understanding and acceptance.  ? ? ? ?Dental advisory given ? ?Plan Discussed with: CRNA and Anesthesiologist ? ?Anesthesia Plan Comments:   ? ? ? ? ? ?Anesthesia Quick  Evaluation ? ?

## 2021-10-10 ENCOUNTER — Ambulatory Visit (HOSPITAL_BASED_OUTPATIENT_CLINIC_OR_DEPARTMENT_OTHER): Payer: Medicare Other | Admitting: Anesthesiology

## 2021-10-10 ENCOUNTER — Ambulatory Visit (HOSPITAL_BASED_OUTPATIENT_CLINIC_OR_DEPARTMENT_OTHER)
Admission: RE | Admit: 2021-10-10 | Discharge: 2021-10-10 | Disposition: A | Discharge status: Home / Self Care | Payer: Medicare Other | Attending: Urology | Admitting: Urology

## 2021-10-10 ENCOUNTER — Encounter (HOSPITAL_BASED_OUTPATIENT_CLINIC_OR_DEPARTMENT_OTHER): Payer: Self-pay | Admitting: Urology

## 2021-10-10 ENCOUNTER — Encounter (HOSPITAL_BASED_OUTPATIENT_CLINIC_OR_DEPARTMENT_OTHER)
Admission: RE | Disposition: A | Discharge status: Home / Self Care | Payer: Self-pay | Source: Home / Self Care | Attending: Urology

## 2021-10-10 DIAGNOSIS — Z9889 Other specified postprocedural states: Secondary | ICD-10-CM | POA: Diagnosis not present

## 2021-10-10 DIAGNOSIS — N400 Enlarged prostate without lower urinary tract symptoms: Secondary | ICD-10-CM

## 2021-10-10 DIAGNOSIS — N401 Enlarged prostate with lower urinary tract symptoms: Secondary | ICD-10-CM | POA: Insufficient documentation

## 2021-10-10 DIAGNOSIS — N210 Calculus in bladder: Secondary | ICD-10-CM

## 2021-10-10 DIAGNOSIS — E119 Type 2 diabetes mellitus without complications: Secondary | ICD-10-CM | POA: Insufficient documentation

## 2021-10-10 DIAGNOSIS — Z7984 Long term (current) use of oral hypoglycemic drugs: Secondary | ICD-10-CM

## 2021-10-10 DIAGNOSIS — N138 Other obstructive and reflux uropathy: Secondary | ICD-10-CM | POA: Diagnosis not present

## 2021-10-10 DIAGNOSIS — K219 Gastro-esophageal reflux disease without esophagitis: Secondary | ICD-10-CM | POA: Insufficient documentation

## 2021-10-10 HISTORY — PX: CYSTOSCOPY WITH LITHOLAPAXY: SHX1425

## 2021-10-10 HISTORY — DX: Personal history of urinary calculi: Z87.442

## 2021-10-10 HISTORY — PX: HOLMIUM LASER APPLICATION: SHX5852

## 2021-10-10 LAB — POCT I-STAT, CHEM 8
BUN: 23 mg/dL (ref 8–23)
Calcium, Ion: 1.28 mmol/L (ref 1.15–1.40)
Chloride: 106 mmol/L (ref 98–111)
Creatinine, Ser: 1.5 mg/dL — ABNORMAL HIGH (ref 0.61–1.24)
Glucose, Bld: 181 mg/dL — ABNORMAL HIGH (ref 70–99)
HCT: 41 % (ref 39.0–52.0)
Hemoglobin: 13.9 g/dL (ref 13.0–17.0)
Potassium: 4.4 mmol/L (ref 3.5–5.1)
Sodium: 140 mmol/L (ref 135–145)
TCO2: 23 mmol/L (ref 22–32)

## 2021-10-10 LAB — GLUCOSE, CAPILLARY: Glucose-Capillary: 162 mg/dL — ABNORMAL HIGH (ref 70–99)

## 2021-10-10 SURGERY — CYSTOSCOPY, WITH BLADDER CALCULUS LITHOLAPAXY
Anesthesia: General | Site: Bladder

## 2021-10-10 MED ORDER — OXYCODONE HCL 5 MG PO TABS: 5.0000 mg | ORAL_TABLET | Freq: Once | ORAL | Status: DC | PRN

## 2021-10-10 MED ORDER — LIDOCAINE 2% (20 MG/ML) 5 ML SYRINGE
INTRAMUSCULAR | Status: DC | PRN
  Administered 2021-10-10: 80 mg via INTRAVENOUS
  Administered 2021-10-10: 20 mg via INTRAVENOUS

## 2021-10-10 MED ORDER — FENTANYL CITRATE (PF) 100 MCG/2ML IJ SOLN
INTRAMUSCULAR | Status: AC
  Filled 2021-10-10: qty 2

## 2021-10-10 MED ORDER — FENTANYL CITRATE (PF) 100 MCG/2ML IJ SOLN: 25.0000 ug | INTRAMUSCULAR | Status: DC | PRN

## 2021-10-10 MED ORDER — PHENYLEPHRINE 40 MCG/ML (10ML) SYRINGE FOR IV PUSH (FOR BLOOD PRESSURE SUPPORT)
PREFILLED_SYRINGE | INTRAVENOUS | Status: DC | PRN
  Administered 2021-10-10 (×2): 120 ug via INTRAVENOUS
  Administered 2021-10-10: 80 ug via INTRAVENOUS

## 2021-10-10 MED ORDER — LACTATED RINGERS IV SOLN: INTRAVENOUS | Status: DC

## 2021-10-10 MED ORDER — PHENYLEPHRINE 40 MCG/ML (10ML) SYRINGE FOR IV PUSH (FOR BLOOD PRESSURE SUPPORT)
PREFILLED_SYRINGE | INTRAVENOUS | Status: AC
  Filled 2021-10-10: qty 10

## 2021-10-10 MED ORDER — OXYCODONE HCL 5 MG/5ML PO SOLN: 5.0000 mg | Freq: Once | ORAL | Status: DC | PRN

## 2021-10-10 MED ORDER — ONDANSETRON HCL 4 MG/2ML IJ SOLN
INTRAMUSCULAR | Status: AC
  Filled 2021-10-10: qty 2

## 2021-10-10 MED ORDER — ONDANSETRON HCL 4 MG/2ML IJ SOLN
INTRAMUSCULAR | Status: DC | PRN
  Administered 2021-10-10: 4 mg via INTRAVENOUS

## 2021-10-10 MED ORDER — LIDOCAINE HCL (PF) 2 % IJ SOLN
INTRAMUSCULAR | Status: AC
  Filled 2021-10-10: qty 5

## 2021-10-10 MED ORDER — ONDANSETRON HCL 4 MG/2ML IJ SOLN: 4.0000 mg | Freq: Once | INTRAMUSCULAR | Status: DC | PRN

## 2021-10-10 MED ORDER — CEFAZOLIN SODIUM-DEXTROSE 2-4 GM/100ML-% IV SOLN
2.0000 g | INTRAVENOUS | Status: AC
  Administered 2021-10-10: 2 g via INTRAVENOUS

## 2021-10-10 MED ORDER — CEFAZOLIN SODIUM-DEXTROSE 2-4 GM/100ML-% IV SOLN
INTRAVENOUS | Status: AC
  Filled 2021-10-10: qty 100

## 2021-10-10 MED ORDER — PROPOFOL 10 MG/ML IV BOLUS
INTRAVENOUS | Status: DC | PRN
  Administered 2021-10-10: 20 mg via INTRAVENOUS
  Administered 2021-10-10: 130 mg via INTRAVENOUS

## 2021-10-10 MED ORDER — SODIUM CHLORIDE 0.9 % IR SOLN
Status: DC | PRN
  Administered 2021-10-10 (×3): 3000 mL

## 2021-10-10 MED ORDER — FENTANYL CITRATE (PF) 100 MCG/2ML IJ SOLN
INTRAMUSCULAR | Status: DC | PRN
  Administered 2021-10-10: 100 ug via INTRAVENOUS

## 2021-10-10 MED ORDER — PROPOFOL 10 MG/ML IV BOLUS
INTRAVENOUS | Status: AC
  Filled 2021-10-10: qty 20

## 2021-10-10 SURGICAL SUPPLY — 20 items
BAG DRAIN URO-CYSTO SKYTR STRL (DRAIN) ×3 IMPLANT
BAG DRN UROCATH (DRAIN) ×1
CATH COUDE FOLEY 2W 5CC 20FR (CATHETERS) ×1 IMPLANT
CLOTH BEACON ORANGE TIMEOUT ST (SAFETY) ×5 IMPLANT
COVER DOME SNAP 22 D (MISCELLANEOUS) ×1 IMPLANT
GLOVE SURG ENC MOIS LTX SZ8 (GLOVE) ×3 IMPLANT
GLOVE SURG POLYISO LF SZ8.5 (GLOVE) ×1 IMPLANT
GLOVE SURG UNDER POLY LF SZ8.5 (GLOVE) ×1 IMPLANT
GOWN STRL REUS TWL 2XL XL LVL4 (GOWN DISPOSABLE) ×1 IMPLANT
GOWN STRL REUS W/TWL LRG LVL3 (GOWN DISPOSABLE) ×1 IMPLANT
GOWN STRL REUS W/TWL XL LVL3 (GOWN DISPOSABLE) ×3 IMPLANT
IV NS IRRIG 3000ML ARTHROMATIC (IV SOLUTION) ×2 IMPLANT
KIT TURNOVER CYSTO (KITS) ×3 IMPLANT
LASER FIB FLEXIVA PULSE ID 910 (Laser) ×1 IMPLANT
MANIFOLD NEPTUNE II (INSTRUMENTS) ×3 IMPLANT
PACK CYSTO (CUSTOM PROCEDURE TRAY) ×3 IMPLANT
SYR TOOMEY IRRIG 70ML (MISCELLANEOUS) ×2
SYRINGE TOOMEY IRRIG 70ML (MISCELLANEOUS) IMPLANT
TUBE CONNECTING 12X1/4 (SUCTIONS) ×3 IMPLANT
WATER STERILE IRR 500ML POUR (IV SOLUTION) IMPLANT

## 2021-10-10 NOTE — Op Note (Signed)
Preoperative diagnosis: Bladder calculi, BPH ? ?Postoperative diagnosis: Same.  Largest stone diameter 12 mm ? ?Principal procedure: Cystolitholapaxy, transurethral incision of prostate, both with holmium laser ? ?Surgeon: Diona Fanti ? ?Anesthesia: General with LMA ? ?Complications: None ? ?Specimen: Stone fragments ? ?Drains: None ? ?Estimated blood loss: Less than 25 mL ? ?Indications: 87 year old male with significant lower urinary tract symptomatology.  He has a history of UroLift performed at another institution in the past.  Despite that, he still has symptomatic issues with BPH.  He also has, newly found, 3 bladder calculi.  He passed 1 recently and had significant issues with this.  He presents now for cystolitholapaxy, and I also discussed, if I feel like this is necessary following the cystolitholapaxy, incision of his prostate using the laser.  He is aware of the procedures, risk, complications, and desires to proceed. ? ?Findings: Prostate was obstructive with bilobar hypertrophy.  There was a high riding bladder neck.  I saw no evidence of prior UroLift, with no capsular tabs in an ectopic position, either within the prostatic urethra or the bladder neck.  There were 3 moderate-sized bladder calculi, the largest of which was approximately 12 mm.  There were moderate trabeculations.  No urothelial abnormalities were noted. ? ?Description of procedure: The patient was properly identified in the holding area.  He is taken to the operating room where general anesthetic was administered with the LMA.  He was placed in the dorsolithotomy position.  Genitalia and perineum were prepped, draped, proper timeout performed. ? ?The 67 French resectoscope sheath was passed using the visual obturator and the Foroblique lens.  The above-mentioned findings were noted.  Once circumferential inspection was performed, I utilized the 1000 ?m laser, and with energy set at 2.0 J and 20 Hz, all 3 stones were fragmented into a  much smaller fragments which were easily removed through the resectoscope sheath.  Small pellet-like fragments were irrigated with a Toomey syringe.  Following removal of all stone fragments, I passed the cystoscope and did careful inspection of the bladder neck.  No capsular tabs were noted.  The resectoscope sheath was again replaced, and using the laser energy, I performed a transurethral incision of the prostate, from the bladder neck, all the way to the verumontanum.  There was no significant bleeding.  The prostate did open up quite well following this procedure.  At this point, the procedure was terminated.  The patient was awakened, taken to the PACU in stable condition.  He tolerated the procedure well. ?

## 2021-10-10 NOTE — Interval H&P Note (Signed)
History and Physical Interval Note: ? ?10/10/2021 ?8:07 AM ? ?[name]  has presented today for surgery, with the diagnosis of BLADDER CALCULI.  The various methods of treatment have been discussed with the patient and family. After consideration of risks, benefits and other options for treatment, the patient has consented to  Procedure(s): ?CYSTOSCOPY WITH LITHOLAPAXY (N/A) ?HOLMIUM LASER APPLICATION (N/A) as a surgical intervention.  The patient's history has been reviewed, patient examined, no change in status, stable for surgery.  I have reviewed the patient's chart and labs.  Questions were answered to the patient's satisfaction.   ? ? ?[name] ? ? ?

## 2021-10-10 NOTE — Anesthesia Postprocedure Evaluation (Signed)
Anesthesia Post Note ? ?Patient: [name] ? ?Procedure(s) Performed: CYSTOSCOPY WITH LITHOLAPAXY (Bladder) ?HOLMIUM LASER APPLICATION (Bladder) ? ?  ? ?Patient location during evaluation: PACU ?Anesthesia Type: General ?Level of consciousness: awake and alert and oriented ?Pain management: pain level controlled ?Vital Signs Assessment: post-procedure vital signs reviewed and stable ?Respiratory status: spontaneous breathing, nonlabored ventilation and respiratory function stable ?Cardiovascular status: blood pressure returned to baseline and stable ?Postop Assessment: no apparent nausea or vomiting ?Anesthetic complications: no ? ? ?No notable events documented. ? ?Last Vitals:  ?Vitals:  ? 10/10/21 1040 10/10/21 1044  ?BP:    ?Pulse: (!) 58 60  ?Resp: 14 18  ?Temp:  36.4 ?C  ?SpO2: 98% 97%  ?  ?Last Pain:  ?Vitals:  ? 10/10/21 1044  ?TempSrc: Oral  ?PainSc:   ? ? ?  ?  ?  ?  ?  ?  ? ?Eaden Hettinger A. ? ? ? ? ?

## 2021-10-10 NOTE — H&P (Signed)
H&P ? ?Chief Complaint: Bladder calculi ? ?History of Present Illness: 87 year old male with history of BPH and prostate cancer, status post brachytherapy, presents at this time for endoscopic management of several bladder calculi. ? ?Past Medical History:  ?Diagnosis Date  ? Allergy   ? mild  ? Arthritis   ? fingers, hip  ? BPH (benign prostatic hypertrophy)   ? Cataract   ? removed both eyes  ? Complication of anesthesia   ? gases nasally causes sneezing   ? Diabetes mellitus 2000  ? type 2  ? Diabetes mellitus type 2 in nonobese ([place]) 06/19/2009  ? Qualifier: Diagnosis of  By: Wynona Luna    ? GERD (gastroesophageal reflux disease)   ? Glaucoma   ? followed by Dr Lanell [facility]ar at [facility]  ? History of kidney stones   ? Hyperlipidemia   ? Left hip pain 03/14/2012  ? Malignant neoplasm of prostate ([place]) 03/15/2019  ? Medicare annual wellness visit, subsequent 12/31/2015  ? Onychomycosis 09/17/2015  ? Prostate cancer ([place])   ? Tubular adenoma of colon 2003  ? Vasomotor rhinitis 05/02/2013  ? ? ?Past Surgical History:  ?Procedure Laterality Date  ? APPENDECTOMY  1943  ? CATARACT EXTRACTION, BILATERAL    ? 2019  ? COLONOSCOPY    ? CYSTOSCOPY WITH INSERTION OF UROLIFT    ? EYE SURGERY    ? laser surgery on left numerous times.   ? POLYPECTOMY    ? TOTAL HIP ARTHROPLASTY Left 11/15/2019  ? Procedure: TOTAL HIP ARTHROPLASTY ANTERIOR APPROACH;  Surgeon: Paralee Cancel, MD;  Location: [facility];  Service: Orthopedics;  Laterality: Left;  70 mins  ? TRABECULECTOMY Left 09/04/2016  ? ? ?Home Medications:  ?Allergies as of 10/10/2021   ? ?   Reactions  ? Levaquin [levofloxacin In D5w] Hives  ? ?  ? ?  ?Medication List  ?  ? ? Notice   ?Cannot display discharge medications because the patient has not yet been admitted. ?  ? ? ?Allergies:  ?Allergies  ?Allergen Reactions  ? Levaquin [Levofloxacin In D5w] Hives  ? ? ?Family History  ?Problem Relation Age of Onset  ? Obstructive Sleep Apnea Sister   ? Heart disease Father   ?      MI  ? Diabetes Father   ? Arthritis Brother   ?     knees  ? Heart disease Brother   ?     a fib  ? Breast cancer Sister   ? Colon cancer Paternal Uncle 59  ? Colon polyps Neg Hx   ? Esophageal cancer Neg Hx   ? Rectal cancer Neg Hx   ? Stomach cancer Neg Hx   ? Prostate cancer Neg Hx   ? ? ?Social History:  reports that he has never smoked. He has never used smokeless tobacco. He reports that he does not currently use alcohol. He reports that he does not use drugs. ? ?ROS: ?A complete review of systems was performed.  All systems are negative except for pertinent findings as noted. ? ?Physical Exam:  ?Vital signs in last 24 hours: ?Ht 6' (1.829 m)   Wt 74.8 kg   BMI 22.38 kg/m?  ?Constitutional:  Alert and oriented, No acute distress ?Cardiovascular: Regular rate  ?Respiratory: Normal respiratory effort ?GI: Abdomen is soft, nontender, nondistended, no abdominal masses. No CVAT.  ?Genitourinary: Normal male phallus, testes are descended bilaterally and non-tender and without masses, scrotum is normal in appearance without lesions or masses, perineum  is normal on inspection. ?Lymphatic: No lymphadenopathy ?Neurologic: Grossly intact, no focal deficits ?Psychiatric: Normal mood and affect ? ?I have reviewed prior pt notes ? ?I have reviewed notes from referring/previous physicians ? ?I have reviewed urinalysis results ? ?I have independently reviewed prior imaging ? ?I have reviewed prior PSA results ? ?I have reviewed prior urine culture ? ? ?Impression/Assessment:  ?Bladder calculi ? ?Plan ? ?Cystolitholapaxy with holmium laser ? ?

## 2021-10-10 NOTE — Anesthesia Procedure Notes (Signed)
Procedure Name: LMA Insertion ?Date/Time: 10/10/2021 9:18 AM ?Performed by: Mechele Claude, CRNA ?Pre-anesthesia Checklist: Patient identified, Emergency Drugs available, Suction available and Patient being monitored ?Patient Re-evaluated:Patient Re-evaluated prior to induction ?Oxygen Delivery Method: Circle system utilized ?Preoxygenation: Pre-oxygenation with 100% oxygen ?Induction Type: IV induction ?Ventilation: Mask ventilation without difficulty ?LMA: LMA inserted ?LMA Size: 5.0 ?Number of attempts: 1 ?Airway Equipment and Method: Bite block ?Placement Confirmation: positive ETCO2 ?Tube secured with: Tape ?Dental Injury: Teeth and Oropharynx as per pre-operative assessment  ? ? ? ? ?

## 2021-10-10 NOTE — Transfer of Care (Signed)
Immediate Anesthesia Transfer of Care Note ? ?Patient: [name] ? ?Procedure(s) Performed: Procedure(s) (LRB): ?CYSTOSCOPY WITH LITHOLAPAXY (N/A) ?HOLMIUM LASER APPLICATION (N/A) ? ?Patient Location: PACU ? ?Anesthesia Type: General ? ?Level of Consciousness: awake, alert  and oriented ? ?Airway & Oxygen Therapy: Patient Spontanous Breathing and Patient connected to face mask oxygen ? ?Post-op Assessment: Report given to PACU RN and Post -op Vital signs reviewed and stable ? ?Post vital signs: Reviewed and stable ? ?Complications: No apparent anesthesia complications ? ?Last Vitals:  ?Vitals Value Taken Time  ?BP 145/86 10/10/21 1010  ?Temp 36.4 ?C 10/10/21 1014  ?Pulse 64 10/10/21 1015  ?Resp 11 10/10/21 1015  ?SpO2 96 % 10/10/21 1015  ?Vitals shown include unvalidated device data. ? ?Last Pain:  ?Vitals:  ? 10/10/21 1014  ?TempSrc: Oral  ?PainSc:   ?   ? ?Patients Stated Pain Goal: 4 (10/10/21 0744) ? ?Complications: No notable events documented. ?

## 2021-10-10 NOTE — Discharge Instructions (Addendum)
You may see some blood in the urine and may have some burning with urination for 48-72 hours. You also may notice that you have to urinate more frequently or urgently after your procedure which is normal.  ?You should call should you develop an inability urinate, fever > 101, persistent nausea and vomiting that prevents you from eating or drinking to stay hydrated.  ?If you have a catheter, you will be taught how to take care of the catheter by the nursing staff prior to discharge from the hospital.  You may periodically feel a strong urge to void with the catheter in place.  This is a bladder spasm and most often can occur when having a bowel movement or moving around. It is typically self-limited and usually will stop after a few minutes.  You may use some Vaseline or Neosporin around the tip of the catheter to reduce friction at the tip of the penis. You may also see some blood in the urine.  A very small amount of blood can make the urine look quite red.  As long as the catheter is draining well, there usually is not a problem.  However, if the catheter is not draining well and is bloody, you should call the office [phone]) to notify us.  It is okay to remove the catheter as instructed by the nurses on Friday morning. ? ?Post Anesthesia Home Care Instructions ? ?Activity: ?Get plenty of rest for the remainder of the day. A responsible adult should stay with you for 24 hours following the procedure.  ?For the next 24 hours, DO NOT: ?-Drive a car ?-Paediatric nurse ?-Drink alcoholic beverages ?-Take any medication unless instructed by your physician ?-Make any legal decisions or sign important papers. ? ?Meals: ?Start with liquid foods such as gelatin or soup. Progress to regular foods as tolerated. Avoid greasy, spicy, heavy foods. If nausea and/or vomiting occur, drink only clear liquids until the nausea and/or vomiting subsides. Call your physician if vomiting continues. ? ?Special  Instructions/Symptoms: ?Your throat may feel dry or sore from the anesthesia or the breathing tube placed in your throat during surgery. If this causes discomfort, gargle with warm salt water. The discomfort should disappear within 24 hours. ? ? ?

## 2021-10-11 ENCOUNTER — Encounter (HOSPITAL_COMMUNITY): Payer: Self-pay | Admitting: Emergency Medicine

## 2021-10-11 ENCOUNTER — Emergency Department (HOSPITAL_COMMUNITY)
Admission: EM | Admit: 2021-10-11 | Discharge: 2021-10-11 | Disposition: A | Discharge status: Home / Self Care | Payer: Medicare Other | Attending: Emergency Medicine | Admitting: Emergency Medicine

## 2021-10-11 DIAGNOSIS — R339 Retention of urine, unspecified: Secondary | ICD-10-CM | POA: Diagnosis present

## 2021-10-11 DIAGNOSIS — Z79899 Other long term (current) drug therapy: Secondary | ICD-10-CM | POA: Diagnosis not present

## 2021-10-11 NOTE — ED Triage Notes (Signed)
Patient reports bladder stone removal yesterday. States removed catheter this morning as instructed and has not urinated since that time. States he did attempt to reinsert catheter without success. ?

## 2021-10-11 NOTE — ED Provider Notes (Signed)
?[place] DEPT ?Provider Note ? ? ?CSN: [id number] ?Arrival date & time: 10/11/21  1807 ? ?  ? ?History ? ?Chief Complaint  ?Patient presents with  ? Urinary Retention  ? ? ?[name] is a 87 y.o. male. ? ?Patient presents with urinary retention times several hours.  Patient had robotic surgery yesterday to his bladder and had a catheter placed.  He self removed it today at home.  States he was only dribbling small amounts of urine.  Reinserted the catheter by himself but did not get much urine return.  Complains of suprapubic pressure.  Denies any flank pain or fever. ? ? ?  ? ?Home Medications ?Prior to Admission medications   ?Medication Sig Start Date End Date Taking? Authorizing Provider  ?alfuzosin (UROXATRAL) 10 MG 24 hr tablet Take 10 mg by mouth daily. 07/10/21   [provider]  ?brimonidine-timolol (COMBIGAN) 0.2-0.5 % ophthalmic solution Place 1 drop into the right eye 2 (two) times daily.     [provider]  ?brinzolamide (AZOPT) 1 % ophthalmic suspension Place 1 drop into the right eye 3 (three) times daily.    [provider]  ?cholecalciferol (VITAMIN D3) 25 MCG (1000 UNIT) tablet Take 1,000 Units by mouth daily.    [provider]  ?ciclopirox (PENLAC) 8 % solution Apply topically at bedtime. Apply over nail and surrounding skin. Apply daily over previous coat. After seven (7) days, may remove with alcohol and continue cycle. ?Patient taking differently: Apply topically at bedtime. Apply over nail and surrounding skin. Apply daily over previous coat. After seven (7) days, may remove with alcohol and  cycle. ? ?Not using right now. 03/312023 07/24/21   Bronson Ing, DPM  ?empagliflozin (JARDIANCE) 10 MG TABS tablet Take 1 tablet (10 mg total) by mouth daily. ?Patient taking differently: Take 20 mg by mouth daily. 06/27/21   Mosie Lukes, MD  ?famotidine (PEPCID) 20 MG tablet TAKE 1 TABLET DAILY AS NEEDED FOR HEARTBURN OR  INDIGESTION ?Patient taking differently: Take 20 mg by mouth daily. 04/10/21   Mosie Lukes, MD  ?glucose blood (TRUE METRIX BLOOD GLUCOSE TEST) test strip USE TO TEST BLOOD SUGAR ONCE DAILY 11/19/20   Mosie Lukes, MD  ?glyBURIDE (DIABETA) 5 MG tablet TAKE 2 TABLETS TWICE DAILY ?Patient taking differently: Take 10 mg by mouth 2 (two) times daily. 01/10/21   Mosie Lukes, MD  ?ipratropium (ATROVENT) 0.03 % nasal spray Place 2 sprays into both nostrils 2 (two) times daily as needed for rhinitis. ?Patient taking differently: Place 2 sprays into both nostrils as needed for rhinitis. 07/30/20   Mosie Lukes, MD  ?metFORMIN (GLUCOPHAGE) 1000 MG tablet TAKE 1 TABLET BY MOUTH IN MORNING, AND TAKE 1/2 TABLET BY MOUTH AT NOON AND TAKE 1 TABLET BY MOUTH IN THE EVENING ?Patient taking differently: Take 1,000 mg by mouth 2 (two) times daily. 05/22/21   Mosie Lukes, MD  ?Multiple Vitamin (MULTIVITAMIN WITH MINERALS) TABS tablet Take 1 tablet by mouth daily.    [provider]  ?simvastatin (ZOCOR) 10 MG tablet Take 1 tablet (10 mg total) by mouth at bedtime. 05/22/21   Mosie Lukes, MD  ?sitaGLIPtin (JANUVIA) 100 MG tablet Take 1 tablet (100 mg total) by mouth daily. 05/22/21   Mosie Lukes, MD  ?travoprost, benzalkonium, (TRAVATAN) 0.004 % ophthalmic solution Place 1 drop into the right eye at bedtime.    [provider]  ?valACYclovir (VALTREX) 1000 MG tablet  TAKE 1 TABLET DAILY ?Patient taking differently: Take 1,000 mg by mouth daily. 08/19/21   Mosie Lukes, MD  ?Milta Deiters MISC Use as directed once daily to check blood sugar.  DX E11.9 06/09/16   Mosie Lukes, MD  ?   ? ?Allergies    ?Levaquin [levofloxacin in d5w]   ? ?Review of Systems   ?Review of Systems  ?All other systems reviewed and are negative. ? ?Physical Exam ?Updated Vital Signs ?BP (!) 182/81   Pulse 78   Temp 99 ?F (37.2 ?C)   Resp 17   SpO2 98%  ?Physical Exam ?Vitals and nursing note reviewed.   ?Constitutional:   ?   General: He is not in acute distress. ?   Appearance: Normal appearance. He is well-developed. He is not toxic-appearing.  ?HENT:  ?   Head: Normocephalic and atraumatic.  ?Eyes:  ?   General: Lids are normal.  ?   Conjunctiva/sclera: Conjunctivae normal.  ?   Pupils: Pupils are equal, round, and reactive to light.  ?Neck:  ?   Thyroid: No thyroid mass.  ?   Trachea: No tracheal deviation.  ?Cardiovascular:  ?   Rate and Rhythm: Normal rate and regular rhythm.  ?   Heart sounds: Normal heart sounds. No murmur heard. ?  No gallop.  ?Pulmonary:  ?   Effort: Pulmonary effort is normal. No respiratory distress.  ?   Breath sounds: Normal breath sounds. No stridor. No decreased breath sounds, wheezing, rhonchi or rales.  ?Abdominal:  ?   General: There is no distension.  ?   Palpations: Abdomen is soft.  ?   Tenderness: There is no abdominal tenderness. There is no rebound.  ?Musculoskeletal:     ?   General: No tenderness. Normal range of motion.  ?   Cervical back: Normal range of motion and neck supple.  ?Skin: ?   General: Skin is warm and dry.  ?   Findings: No abrasion or rash.  ?Neurological:  ?   Mental Status: He is alert and oriented to person, place, and time. Mental status is at baseline.  ?   GCS: GCS eye subscore is 4. GCS verbal subscore is 5. GCS motor subscore is 6.  ?   Cranial Nerves: No cranial nerve deficit.  ?   Sensory: No sensory deficit.  ?   Motor: Motor function is intact.  ?Psychiatric:     ?   Attention and Perception: Attention normal.     ?   Speech: Speech normal.     ?   Behavior: Behavior normal.  ? ? ?ED Results / Procedures / Treatments   ?Labs ?(all labs ordered are listed, but only abnormal results are displayed) ?Labs Reviewed - No data to display ? ?EKG ?None ? ?Radiology ?No results found. ? ?Procedures ?Procedures  ? ? ?Medications Ordered in ED ?Medications - No data to display ? ?ED Course/ Medical Decision Making/ A&P ?  ?                         ?Medical Decision Making ? ?Patient presented with urinary retention times several hours.  Foley catheter placed by nursing with yellow urine.  No severe hematuria.  No concern for obstruction.  Do not feel that patient needs to have a UA or blood work at this time.  He will follow-up with urology as needed ? ? ? ? ? ? ? ?Final Clinical Impression(s) /  ED Diagnoses ?Final diagnoses:  ?None  ? ? ?Rx / DC Orders ?ED Discharge Orders   ? ? None  ? ?  ? ? ?  ?Lacretia Leigh, MD ?10/11/21 1925 ? ?

## 2021-10-11 NOTE — ED Notes (Signed)
Leg bag provided.  Catheter care discussed with patient.   ?

## 2021-10-14 ENCOUNTER — Encounter (HOSPITAL_BASED_OUTPATIENT_CLINIC_OR_DEPARTMENT_OTHER): Payer: Self-pay | Admitting: Urology

## 2021-10-14 NOTE — Progress Notes (Signed)
? ? ?Virtual telephone visit ? ? ? ?Virtual Visit via Telephone Note  ? ?This visit type was conducted due to national recommendations for restrictions regarding the COVID-19 Pandemic (e.g. social distancing) in an effort to limit this patient's exposure and mitigate transmission in our community. Due to his co-morbid illnesses, this patient is at least at moderate risk for complications without adequate follow up. This format is felt to be most appropriate for this patient at this time. The patient did not have access to video technology or had technical difficulties with video requiring transitioning to audio format only (telephone). Physical exam was limited to content and character of the telephone converstion. Tomekia Frierson-Sandells, CMA was able to get the patient set up on a telephone visit. ? ? ?Patient location: home Patient and provider in visit ?Provider location: Office ? ?I discussed the limitations of evaluation and management by telemedicine and the availability of in person appointments. The patient expressed understanding and agreed to proceed.  ? ?Visit Date: 10/15/2021 ? ?Today's healthcare provider: Penni Homans, MD  ? ? ? ?Subjective:  ? ? Patient ID: [name], male    DOB: 21-Feb-1935, 87 y.o.   MRN: [id number] ? ?Chief Complaint  ?Patient presents with  ? Follow-up  ? ? ?HPI ?Patient is in today for a follow up. He is really struggling with bladder stones and urinary retention. He had several big stones blasted last week by urology and unfortunately he has not been able to urinate on his own since then despite several attempts. He is uncomfortable and scheduled to see urology soon. His sugars have been trending up. No polydipsia. Denies CP/palp/SOB/HA/congestion/fevers/GI c/o. Taking meds as prescribed  ? ?Past Medical History:  ?Diagnosis Date  ? Allergy   ? mild  ? Arthritis   ? fingers, hip  ? BPH (benign prostatic hypertrophy)   ? Cataract   ? removed both eyes  ? Complication of  anesthesia   ? gases nasally causes sneezing   ? Diabetes mellitus 2000  ? type 2  ? Diabetes mellitus type 2 in nonobese ([place]) 06/19/2009  ? Qualifier: Diagnosis of  By: Wynona Luna    ? GERD (gastroesophageal reflux disease)   ? Glaucoma   ? [facility] by Dr [facility]ell [facility] at [facility]  ? History of kidney stones   ? Hyperlipidemia   ? Left hip pain 03/14/2012  ? Malignant neoplasm of prostate ([place]) 03/15/2019  ? Medicare annual wellness visit, subsequent 12/31/2015  ? Onychomycosis 09/17/2015  ? Prostate cancer ([place])   ? Tubular adenoma of colon 2003  ? Vasomotor rhinitis 05/02/2013  ? ? ?Past Surgical History:  ?Procedure Laterality Date  ? APPENDECTOMY  1943  ? CATARACT EXTRACTION, BILATERAL    ? 2019  ? COLONOSCOPY    ? CYSTOSCOPY WITH INSERTION OF UROLIFT    ? CYSTOSCOPY WITH LITHOLAPAXY N/A 10/10/2021  ? Procedure: CYSTOSCOPY WITH LITHOLAPAXY;  Surgeon: Franchot Gallo, MD;  [facility];  Service: Urology;  Laterality: N/A;  ? EYE SURGERY    ? laser surgery on left numerous times.   ? HOLMIUM LASER APPLICATION N/A 0/10/5995  ? Procedure: HOLMIUM LASER APPLICATION;  Surgeon: Franchot Gallo, MD;  Location: [facility];  Service: Urology;  Laterality: N/A;  ? POLYPECTOMY    ? TOTAL HIP ARTHROPLASTY Left 11/15/2019  ? Procedure: TOTAL HIP ARTHROPLASTY ANTERIOR APPROACH;  Surgeon: Paralee Cancel, MD;  Location: [facility];  Service: Orthopedics;  Laterality: Left;  70 mins  ?  TRABECULECTOMY Left 09/04/2016  ? ? ?Family History  ?Problem Relation Age of Onset  ? Obstructive Sleep Apnea Sister   ? Heart disease Father   ?     MI  ? Diabetes Father   ? Arthritis Brother   ?     knees  ? Heart disease Brother   ?     a fib  ? Breast cancer Sister   ? Colon cancer Paternal Uncle 47  ? Colon polyps Neg Hx   ? Esophageal cancer Neg Hx   ? Rectal cancer Neg Hx   ? Stomach cancer Neg Hx   ? Prostate cancer Neg Hx   ? ? ?Social History  ? ?Socioeconomic History  ? Marital status:  Married  ?  Spouse name: Mary  ? Number of children: 0  ? Years of education: Not on file  ? Highest education level: Not on file  ?Occupational History  ? Occupation: semi retired  ?  Employer: [employer]  ?  Employer: RETIRED  ?Tobacco Use  ? Smoking status: Never  ? Smokeless tobacco: Never  ?Vaping Use  ? Vaping Use: Never used  ?Substance and Sexual Activity  ? Alcohol use: Not Currently  ? Drug use: No  ? Sexual activity: Yes  ?  Comment: lives with wife, travels frequently, no major dietary restrictions. exercises regularly with aerobic, stretch and weights  ?Other Topics Concern  ? Not on file  ?Social History Narrative  ? Not on file  ? ?Social Determinants of Health  ? ?Financial Resource Strain: Not on file  ?Food Insecurity: Not on file  ?Transportation Needs: Not on file  ?Physical Activity: Not on file  ?Stress: Not on file  ?Social Connections: Not on file  ?Intimate Partner Violence: Not on file  ? ? ?Outpatient Medications Prior to Visit  ?Medication Sig Dispense Refill  ? alfuzosin (UROXATRAL) 10 MG 24 hr tablet Take 10 mg by mouth daily.    ? brimonidine-timolol (COMBIGAN) 0.2-0.5 % ophthalmic solution Place 1 drop into the right eye 2 (two) times daily.     ? brinzolamide (AZOPT) 1 % ophthalmic suspension Place 1 drop into the right eye 3 (three) times daily.    ? cholecalciferol (VITAMIN D3) 25 MCG (1000 UNIT) tablet Take 1,000 Units by mouth daily.    ? empagliflozin (JARDIANCE) 10 MG TABS tablet Take 1 tablet (10 mg total) by mouth daily. (Patient taking differently: Take 20 mg by mouth daily.) 90 tablet 1  ? famotidine (PEPCID) 20 MG tablet TAKE 1 TABLET DAILY AS NEEDED FOR HEARTBURN OR INDIGESTION (Patient taking differently: Take 20 mg by mouth daily.) 90 tablet 1  ? glyBURIDE (DIABETA) 5 MG tablet TAKE 2 TABLETS TWICE DAILY (Patient taking differently: Take 10 mg by mouth 2 (two) times daily.) 360 tablet 1  ? ipratropium (ATROVENT) 0.03 % nasal spray Place 2 sprays into both nostrils 2 (two)  times daily as needed for rhinitis. (Patient taking differently: Place 2 sprays into both nostrils as needed for rhinitis.) 90 mL 3  ? metFORMIN (GLUCOPHAGE) 1000 MG tablet TAKE 1 TABLET BY MOUTH IN MORNING, AND TAKE 1/2 TABLET BY MOUTH AT NOON AND TAKE 1 TABLET BY MOUTH IN THE EVENING (Patient taking differently: Take 1,000 mg by mouth 2 (two) times daily.) 225 tablet 1  ? Multiple Vitamin (MULTIVITAMIN WITH MINERALS) TABS tablet Take 1 tablet by mouth daily.    ? simvastatin (ZOCOR) 10 MG tablet Take 1 tablet (10 mg total) by mouth at bedtime.  90 tablet 1  ? sitaGLIPtin (JANUVIA) 100 MG tablet Take 1 tablet (100 mg total) by mouth daily. 90 tablet 1  ? travoprost, benzalkonium, (TRAVATAN) 0.004 % ophthalmic solution Place 1 drop into the right eye at bedtime.    ? valACYclovir (VALTREX) 1000 MG tablet TAKE 1 TABLET DAILY (Patient taking differently: Take 1,000 mg by mouth daily.) 90 tablet 3  ? [place] LANCETS MISC Use as directed once daily to check blood sugar.  DX E11.9 100 each 6  ? ciclopirox (PENLAC) 8 % solution Apply topically at bedtime. Apply over nail and surrounding skin. Apply daily over previous coat. After seven (7) days, may remove with alcohol and continue cycle. (Patient not taking: Reported on 10/15/2021) 6.6 mL 0  ? glucose blood (TRUE METRIX BLOOD GLUCOSE TEST) test strip USE TO TEST BLOOD SUGAR ONCE DAILY (Patient not taking: Reported on 10/15/2021) 100 strip 12  ? ?No facility-administered medications prior to visit.  ? ? ?Allergies  ?Allergen Reactions  ? Levaquin [Levofloxacin In D5w] Hives  ? ? ?Review of Systems  ?Constitutional:  Positive for malaise/fatigue. Negative for fever.  ?HENT:  Negative for congestion.   ?Eyes:  Negative for blurred vision.  ?Respiratory:  Negative for shortness of breath.   ?Cardiovascular:  Negative for chest pain, palpitations and leg swelling.  ?Gastrointestinal:  Negative for abdominal pain, blood in stool and nausea.  ?Genitourinary:  Negative for  dysuria, flank pain and frequency.  ?Musculoskeletal:  Negative for falls.  ?Skin:  Negative for rash.  ?Neurological:  Negative for dizziness, loss of consciousness and headaches.  ?Endo/Heme/Allergies:

## 2021-10-15 ENCOUNTER — Telehealth (INDEPENDENT_AMBULATORY_CARE_PROVIDER_SITE_OTHER): Payer: Medicare Other | Admitting: Family Medicine

## 2021-10-15 ENCOUNTER — Encounter: Payer: Self-pay | Admitting: Family Medicine

## 2021-10-15 DIAGNOSIS — E119 Type 2 diabetes mellitus without complications: Secondary | ICD-10-CM

## 2021-10-15 DIAGNOSIS — N210 Calculus in bladder: Secondary | ICD-10-CM | POA: Diagnosis not present

## 2021-10-15 DIAGNOSIS — E782 Mixed hyperlipidemia: Secondary | ICD-10-CM

## 2021-10-15 DIAGNOSIS — K5900 Constipation, unspecified: Secondary | ICD-10-CM | POA: Diagnosis not present

## 2021-10-16 ENCOUNTER — Other Ambulatory Visit: Payer: Self-pay

## 2021-10-16 DIAGNOSIS — N210 Calculus in bladder: Secondary | ICD-10-CM | POA: Insufficient documentation

## 2021-10-16 NOTE — Assessment & Plan Note (Signed)
hgba1c acceptable, minimize simple carbs. Increase exercise as tolerated. Continue current meds. His 30 day fasting glucose and hgba1c have trended up over the past 3 months, December 124, January 146, February 145, March 152. Likely being made worse by acute illness will monitor and consider changing meds after next blood draw ?

## 2021-10-16 NOTE — Assessment & Plan Note (Signed)
He is working with Dr Diona Fanti of [facility] and had some large stones blast week on Thursday but unfortunately since that time he has been unable to urinate on his own. He now has an indwelling catheter which is uncomfortable and will see urology soon to try and remove again. ?

## 2021-10-16 NOTE — Addendum Note (Signed)
Addended by: Penni Homans A on: 10/16/2021 09:52 PM ? ? Modules accepted: Level of Service ? ?

## 2021-10-17 ENCOUNTER — Telehealth: Payer: Self-pay | Admitting: Family Medicine

## 2021-10-17 MED ORDER — FAMOTIDINE 20 MG PO TABS: 20.0000 mg | ORAL_TABLET | Freq: Every day | ORAL | 1 refills | Status: AC

## 2021-10-17 MED ORDER — EMPAGLIFLOZIN 10 MG PO TABS: 20.0000 mg | ORAL_TABLET | Freq: Every day | ORAL | 1 refills | Status: DC

## 2021-10-17 MED ORDER — METFORMIN HCL 1000 MG PO TABS: 1000.0000 mg | ORAL_TABLET | Freq: Two times a day (BID) | ORAL | 1 refills | Status: DC

## 2021-10-17 MED ORDER — SIMVASTATIN 10 MG PO TABS
10.0000 mg | ORAL_TABLET | Freq: Every day | ORAL | 1 refills | Status: DC
Start: 2021-10-17 — End: 2022-03-25

## 2021-10-17 MED ORDER — GLYBURIDE 5 MG PO TABS: 10.0000 mg | ORAL_TABLET | Freq: Two times a day (BID) | ORAL | 1 refills | Status: DC

## 2021-10-17 MED ORDER — SITAGLIPTIN PHOSPHATE 100 MG PO TABS: 100.0000 mg | ORAL_TABLET | Freq: Every day | ORAL | 1 refills | Status: DC

## 2021-10-17 NOTE — Telephone Encounter (Signed)
Refills sent per pt request

## 2021-10-17 NOTE — Telephone Encounter (Signed)
Pt aware that all of his medications were sent to [facility].  ?

## 2021-10-17 NOTE — Telephone Encounter (Signed)
Medication: glyBURIDE (DIABETA) 5 MG tablet [ ? ?Has the patient contacted their pharmacy? Yes.   ? ?Preferred Pharmacy : ?[facility] [facility]  ? ?Agent: Please be advised that RX refills may take up to 3 business days. We ask that you follow-up with your pharmacy.  ?

## 2021-10-23 ENCOUNTER — Ambulatory Visit (INDEPENDENT_AMBULATORY_CARE_PROVIDER_SITE_OTHER): Payer: Medicare Other | Admitting: Podiatry

## 2021-10-23 ENCOUNTER — Encounter: Payer: Self-pay | Admitting: Podiatry

## 2021-10-23 DIAGNOSIS — E119 Type 2 diabetes mellitus without complications: Secondary | ICD-10-CM | POA: Diagnosis not present

## 2021-10-23 DIAGNOSIS — L603 Nail dystrophy: Secondary | ICD-10-CM

## 2021-10-23 DIAGNOSIS — B351 Tinea unguium: Secondary | ICD-10-CM | POA: Diagnosis not present

## 2021-10-23 NOTE — Progress Notes (Signed)
This patient returns to my office for at risk foot care.  This patient requires this care by a professional since this patient will be at risk due to having diabetes.  This patient is unable to cut nails himself since the patient cannot reach his nails.These nails are painful walking and wearing shoes.  This patient presents for at risk foot care today.  General Appearance  Alert, conversant and in no acute stress.  Vascular  Dorsalis pedis and posterior tibial  pulses are palpable  bilaterally.  Capillary return is within normal limits  bilaterally. Temperature is within normal limits  bilaterally.  Neurologic  Senn-Weinstein monofilament wire test within normal limits  bilaterally. Muscle power within normal limits bilaterally.  Nails Thick disfigured discolored nails with subungual debris  from hallux to fifth toes bilaterally. No evidence of bacterial infection or drainage bilaterally.  Orthopedic  No limitations of motion  feet .  No crepitus or effusions noted.  No bony pathology or digital deformities noted.  Skin  normotropic skin with no porokeratosis noted bilaterally.  No signs of infections or ulcers noted.     Onychomycosis  Pain in right toes  Pain in left toes  Consent was obtained for treatment procedures.   Mechanical debridement of nails 1-5  bilaterally performed with a nail nipper.  Filed with dremel without incident.    Return office visit  10 weeks                    Told patient to return for periodic foot care and evaluation due to potential at risk complications.   Payslee Bateson DPM   

## 2021-10-29 ENCOUNTER — Telehealth: Payer: Self-pay | Admitting: Family Medicine

## 2021-10-29 MED ORDER — IPRATROPIUM BROMIDE 0.03 % NA SOLN: 2.0000 | NASAL | 3 refills | Status: DC | PRN

## 2021-10-29 NOTE — Telephone Encounter (Signed)
Medication: ipratropium (ATROVENT) 0.03 % nasal spray ? ?Has the patient contacted their pharmacy? No. ? ?Preferred Pharmacy: [facility], [place] to [facility]istered [facility]  ?[address], [place] [address]  ?Phone:  [phone]  Fax:  [phone]  ? ?

## 2021-10-29 NOTE — Telephone Encounter (Signed)
Refill sent.

## 2021-12-03 ENCOUNTER — Encounter: Payer: Self-pay | Admitting: Family Medicine

## 2021-12-06 NOTE — Telephone Encounter (Signed)
VV made for 12/12/21

## 2021-12-11 ENCOUNTER — Telehealth: Payer: Self-pay | Admitting: Family Medicine

## 2021-12-11 ENCOUNTER — Other Ambulatory Visit: Payer: Self-pay

## 2021-12-11 MED ORDER — TRUE METRIX BLOOD GLUCOSE TEST VI STRP: ORAL_STRIP | 12 refills | Status: DC

## 2021-12-11 NOTE — Progress Notes (Signed)
Virtual telephone visit    Virtual Visit via Telephone Note   This visit type was conducted due to national recommendations for restrictions regarding the COVID-19 Pandemic (e.g. social distancing) in an effort to limit this patient's exposure and mitigate transmission in our community. Due to his co-morbid illnesses, this patient is at least at moderate risk for complications without adequate follow up. This format is felt to be most appropriate for this patient at this time. The patient did not have access to video technology or had technical difficulties with video requiring transitioning to audio format only (telephone). Physical exam was limited to content and character of the telephone converstion. Verdell Carmine., CMA was able to get the patient set up on a telephone visit.   Patient location: Home Patient and provider in visit Provider location: Office  I discussed the limitations of evaluation and management by telemedicine and the availability of in person appointments. The patient expressed understanding and agreed to proceed.   Visit Date: 12/12/2021  Today's healthcare provider: Penni Homans, MD     Subjective:    Patient ID: [name], male    DOB: 08/23/1934, 87 y.o.   MRN: [id number]  Chief Complaint  Patient presents with   Diabetes    HPI Patient is in today for diabetes medication management. He follows with [facility].  In April he had 3 large bladder stones which were unable to pass on their own so we had to have them removed.  He ended up with a catheter for 6 days and since then has had multiple yeast infections and UTIs really has not felt well for several months.  At present he just finished a second course of Diflucan 200 mg daily for 14 days.  He is now taking an AZO cranberry tablet but has run out of his probiotics. He reports during all of his urology struggles his sugars have been increasing. glucose 30-day average readings for  the patient have trended up over the last 6 months.  December 124, January 146, February 145, March 152, April 162, may 145.  June early average is 164 daily with a range from 137-197. Denies CP/palp/SOB/HA/congestion/fevers/GI c/o. Taking meds as prescribed   Past Medical History:  Diagnosis Date   Allergy    mild   Arthritis    fingers, hip   BPH (benign prostatic hypertrophy)    Cataract    removed both eyes   Complication of anesthesia    gases nasally causes sneezing    Diabetes mellitus 2000   type 2   Diabetes mellitus type 2 in nonobese ([place]) 06/19/2009   Qualifier: Diagnosis of  By: Wynona Luna     GERD (gastroesophageal reflux disease)    Glaucoma    followed by Dr Lanell Matar at [facility]   History of kidney stones    Hyperlipidemia    Left hip pain 03/14/2012   Malignant neoplasm of prostate ([place]) 03/15/2019   Medicare annual wellness visit, subsequent 12/31/2015   Onychomycosis 09/17/2015   Prostate cancer ([place])    Tubular adenoma of colon 2003   Vasomotor rhinitis 05/02/2013    Past Surgical History:  Procedure Laterality Date   APPENDECTOMY  1943   CATARACT EXTRACTION, BILATERAL     2019   COLONOSCOPY     CYSTOSCOPY WITH INSERTION OF UROLIFT     CYSTOSCOPY WITH LITHOLAPAXY N/A 10/10/2021   Procedure: CYSTOSCOPY WITH LITHOLAPAXY;  Surgeon: Franchot Gallo, MD;  Location: [facility];  Service: Urology;  Laterality: N/A;   EYE SURGERY     laser surgery on left numerous times.    HOLMIUM LASER APPLICATION N/A 1/0/1751   Procedure: HOLMIUM LASER APPLICATION;  Surgeon: Franchot Gallo, MD;  Location: [facility];  Service: Urology;  Laterality: N/A;   POLYPECTOMY     TOTAL HIP ARTHROPLASTY Left 11/15/2019   Procedure: TOTAL HIP ARTHROPLASTY ANTERIOR APPROACH;  Surgeon: Paralee Cancel, MD;  Location: [facility];  Service: Orthopedics;  Laterality: Left;  70 mins   TRABECULECTOMY Left 09/04/2016    Family History  Problem  Relation Age of Onset   Obstructive Sleep Apnea Sister    Heart disease Father        MI   Diabetes Father    Arthritis Brother        knees   Heart disease Brother        a fib   Breast cancer Sister    Colon cancer Paternal Uncle 77   Colon polyps Neg Hx    Esophageal cancer Neg Hx    Rectal cancer Neg Hx    Stomach cancer Neg Hx    Prostate cancer Neg Hx     Social History   Socioeconomic History   Marital status: Married    Spouse name: [place]   Number of children: 0   Years of education: Not on file   Highest education level: Not on file  Occupational History   Occupation: semi retired    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 exercises regularly with aerobic, stretch and weights  Other Topics Concern   Not on file  Social History Narrative   Not on file   Social Determinants of Health   Financial Resource Strain: Not on file  Food Insecurity: Not on file  Transportation Needs: Not on file  Physical Activity: Not on file  Stress: Not on file  Social Connections: Not on file  Intimate Partner Violence: Not on file    Outpatient Medications Prior to Visit  Medication Sig Dispense Refill   alfuzosin (UROXATRAL) 10 MG 24 hr tablet Take 10 mg by mouth daily.     brimonidine-timolol (COMBIGAN) 0.2-0.5 % ophthalmic solution Place 1 drop into the right eye 2 (two) times daily.      brinzolamide (AZOPT) 1 % ophthalmic suspension Place 1 drop into the right eye 3 (three) times daily.     cholecalciferol (VITAMIN D3) 25 MCG (1000 UNIT) tablet Take 1,000 Units by mouth daily.     ciclopirox (PENLAC) 8 % solution Apply topically at bedtime. Apply over nail and surrounding skin. Apply daily  over previous coat. After seven (7) days, may remove with alcohol and continue cycle. 6.6 mL 0   empagliflozin (JARDIANCE) 10 MG TABS tablet Take 2 tablets (20 mg total) by mouth daily. 180 tablet 1   famotidine (PEPCID) 20 MG tablet Take 1 tablet (20 mg total) by mouth daily. 90 tablet 1   glucose blood (TRUE METRIX BLOOD GLUCOSE TEST) test strip USE TO TEST BLOOD SUGAR ONCE DAILY 100 strip 12   glyBURIDE (DIABETA) 5 MG tablet Take 2 tablets (10 mg total) by  mouth 2 (two) times daily. 360 tablet 1   ipratropium (ATROVENT) 0.03 % nasal spray Place 2 sprays into both nostrils as needed for rhinitis. 30 mL 3   metFORMIN (GLUCOPHAGE) 1000 MG tablet Take 1 tablet (1,000 mg total) by mouth 2 (two) times daily. 180 tablet 1   Multiple Vitamin (MULTIVITAMIN WITH MINERALS) TABS tablet Take 1 tablet by mouth daily.     simvastatin (ZOCOR) 10 MG tablet Take 1 tablet (10 mg total) by mouth at bedtime. 90 tablet 1   sitaGLIPtin (JANUVIA) 100 MG tablet Take 1 tablet (100 mg total) by mouth daily. 90 tablet 1   travoprost, benzalkonium, (TRAVATAN) 0.004 % ophthalmic solution Place 1 drop into the right eye at bedtime.     valACYclovir (VALTREX) 1000 MG tablet TAKE 1 TABLET DAILY (Patient taking differently: Take 1,000 mg by mouth daily.) 90 tablet 3   [facility] LANCETS MISC Use as directed once daily to check blood sugar.  DX E11.9 100 each 6   No facility-administered medications prior to visit.    Allergies  Allergen Reactions   Levaquin [Levofloxacin In D5w] Hives    Review of Systems  Constitutional:  Negative for fever and malaise/fatigue.  HENT:  Negative for congestion.   Eyes:  Negative for blurred vision.  Respiratory:  Negative for shortness of breath.   Cardiovascular:  Negative for chest pain and palpitations.  Gastrointestinal:  Negative for abdominal pain.  Genitourinary:  Negative for dysuria and hematuria.  Musculoskeletal:  Negative for falls.  Neurological:  Negative for loss of  consciousness and headaches.  Psychiatric/Behavioral:  The patient is not nervous/anxious.        Objective:    Physical Exam unable to obtain via phone visit  There were no vitals taken for this visit. Wt Readings from Last 3 Encounters:  10/10/21 171 lb 14.4 oz (78 kg)  05/22/21 167 lb (75.8 kg)  05/01/21 167 lb 6 oz (75.9 kg)    Diabetic Foot Exam - Simple   No data filed    Lab Results  Component Value Date   WBC 7.3 10/08/2021   HGB 13.9 10/10/2021   HCT 41.0 10/10/2021   PLT 204.0 10/08/2021   GLUCOSE 181 (H) 10/10/2021   CHOL 168 10/08/2021   TRIG 118.0 10/08/2021   HDL 62.90 10/08/2021   LDLCALC 82 10/08/2021   ALT 13 10/08/2021   AST 15 10/08/2021   NA 140 10/10/2021   K 4.4 10/10/2021   CL 106 10/10/2021   CREATININE 1.50 (H) 10/10/2021   BUN 23 10/10/2021   CO2 25 10/08/2021   TSH 1.85 03/22/2021   PSA 0.19 09/25/2021   HGBA1C 7.6 (H) 10/08/2021   MICROALBUR 0.7 03/25/2021    Lab Results  Component Value Date   TSH 1.85 03/22/2021   Lab Results  Component Value Date   WBC 7.3 10/08/2021   HGB 13.9 10/10/2021   HCT 41.0 10/10/2021   MCV 96.4 10/08/2021   PLT 204.0 10/08/2021   Lab Results  Component Value Date   NA 140 10/10/2021   K 4.4 10/10/2021   CO2 25 10/08/2021   GLUCOSE 181 (H) 10/10/2021   BUN 23 10/10/2021   CREATININE 1.50 (H) 10/10/2021   BILITOT 0.4 10/08/2021   ALKPHOS 59 10/08/2021   AST 15 10/08/2021   ALT 13 10/08/2021   PROT 6.1 10/08/2021   ALBUMIN 4.2 10/08/2021   CALCIUM 9.0 10/08/2021   ANIONGAP 9 11/16/2019   GFR 47.30 (L) 10/08/2021   Lab  Results  Component Value Date   CHOL 168 10/08/2021   Lab Results  Component Value Date   HDL 62.90 10/08/2021   Lab Results  Component Value Date   LDLCALC 82 10/08/2021   Lab Results  Component Value Date   TRIG 118.0 10/08/2021   Lab Results  Component Value Date   CHOLHDL 3 10/08/2021   Lab Results  Component Value Date   HGBA1C 7.6 (H) 10/08/2021        Assessment & Plan:   Problem List Items Addressed This Visit     Diabetes mellitus type 2 in nonobese ([place]) - Primary    hgba1c acceptable, minimize simple carbs. Increase exercise as tolerated. Continue current meds, increasing A1C, glucose 30-day average readings for the patient have trended up over the last 6 months.  December 124, January 146, February 145, March 152, April 162, may 145.  June early average is 164 daily with a range from 137-197.  He is traveling to his place in [place] soon so does not want to make any major medication changes prior to travel.  He will return for hemoglobin A1c in late June and then we will dose medications accordingly.  At present the plan is to start Ozempic and stop Jardiance, drop glyburide in half and monitor.  He continues to watch his glucose intake and stay active.      Hyperlipidemia    Tolerating statin, encouraged heart healthy diet, avoid trans fats, minimize simple carbs and saturated fats. Increase exercise as tolerated      Urinary bladder stone    He follows with [facility] of [facility].  In April he had 3 large bladder stones which were unable to pass on their own so we had to have them removed.  He ended up with a catheter for 6 days and since then has had multiple yeast infections and UTIs really has not felt well for several months.  At present he just finished a second course of Diflucan 200 mg daily for 14 days.  He is now taking an AZO cranberry tablet but has run out of his probiotics.  He is encouraged to hydrate well, take Azo daily and add an multistrand probiotic.  If this trend continues he will have to stop his Jardiance as this may be contributing to his urinary tract infections patient is aware.       I am having Bronte L. Mcgirr maintain his travoprost (benzalkonium), brimonidine-timolol, [facility] Lancets, brinzolamide, multivitamin with minerals, cholecalciferol, alfuzosin, ciclopirox, valACYclovir,  glyBURIDE, empagliflozin, simvastatin, sitaGLIPtin, metFORMIN, famotidine, ipratropium, and True Metrix Blood Glucose Test.  No orders of the defined types were placed in this encounter.    I discussed the assessment and treatment plan with the patient. The patient was provided an opportunity to ask questions and all were answered. The patient agreed with the plan and demonstrated an understanding of the instructions.   The patient was advised to call back or seek an in-person evaluation if the symptoms worsen or if the condition fails to improve as anticipated.  Spent 25 minutes on the phone with patient discussing current state and developing a plan of care Penni Homans, MD [facility] [phone] (phone) [phone] (fax)  [place]

## 2021-12-11 NOTE — Telephone Encounter (Signed)
Pt states he needs a new script for his glucose blood (TRUE METRIX BLOOD GLUCOSE TEST) test strip   [id number] [address], [address]   [place], [place] [address]  Phone:  [phone]  Fax:  [phone]

## 2021-12-11 NOTE — Telephone Encounter (Signed)
Refill sent.

## 2021-12-12 ENCOUNTER — Encounter: Payer: Self-pay | Admitting: Family Medicine

## 2021-12-12 ENCOUNTER — Ambulatory Visit (INDEPENDENT_AMBULATORY_CARE_PROVIDER_SITE_OTHER): Payer: Medicare Other | Admitting: Family Medicine

## 2021-12-12 DIAGNOSIS — N210 Calculus in bladder: Secondary | ICD-10-CM | POA: Diagnosis not present

## 2021-12-12 DIAGNOSIS — E782 Mixed hyperlipidemia: Secondary | ICD-10-CM | POA: Diagnosis not present

## 2021-12-12 DIAGNOSIS — E119 Type 2 diabetes mellitus without complications: Secondary | ICD-10-CM | POA: Diagnosis not present

## 2021-12-12 NOTE — Assessment & Plan Note (Addendum)
hgba1c acceptable, minimize simple carbs. Increase exercise as tolerated. Continue current meds, increasing A1C, glucose 30-day average readings for the patient have trended up over the last 6 months.  December 124, January 146, February 145, March 152, April 162, may 145.  June early average is 164 daily with a range from 137-197.  He is traveling to his place in [place] soon so does not want to make any major medication changes prior to travel.  He will return for hemoglobin A1c in late June and then we will dose medications accordingly.  At present the plan is to start Ozempic and stop Jardiance, drop glyburide in half and monitor.  He continues to watch his glucose intake and stay active.

## 2021-12-12 NOTE — Assessment & Plan Note (Signed)
Tolerating statin, encouraged heart healthy diet, avoid trans fats, minimize simple carbs and saturated fats. Increase exercise as tolerated 

## 2021-12-12 NOTE — Assessment & Plan Note (Signed)
He follows with Dr Dorina H[facility] of [facility].  In April he had 3 large bladder stones which were unable to pass on their own so we had to have them removed.  He ended up with a catheter for 6 days and since then has had multiple yeast infections and UTIs really has not felt well for several months.  At present he just finished a second course of Diflucan 200 mg daily for 14 days.  He is now taking an AZO cranberry tablet but has run out of his probiotics.  He is encouraged to hydrate well, take Azo daily and add an multistrand probiotic.  If this trend continues he will have to stop his Jardiance as this may be contributing to his urinary tract infections patient is aware.

## 2022-01-01 ENCOUNTER — Encounter: Payer: Self-pay | Admitting: Podiatry

## 2022-01-01 ENCOUNTER — Ambulatory Visit (INDEPENDENT_AMBULATORY_CARE_PROVIDER_SITE_OTHER): Payer: Medicare Other | Admitting: Podiatry

## 2022-01-01 DIAGNOSIS — B351 Tinea unguium: Secondary | ICD-10-CM

## 2022-01-01 DIAGNOSIS — L603 Nail dystrophy: Secondary | ICD-10-CM

## 2022-01-01 DIAGNOSIS — E119 Type 2 diabetes mellitus without complications: Secondary | ICD-10-CM | POA: Diagnosis not present

## 2022-01-01 NOTE — Progress Notes (Signed)
This patient returns to my office for at risk foot care.  This patient requires this care by a professional since this patient will be at risk due to having diabetes.  This patient is unable to cut nails himself since the patient cannot reach his nails.These nails are painful walking and wearing shoes.  This patient presents for at risk foot care today.  General Appearance  Alert, conversant and in no acute stress.  Vascular  Dorsalis pedis and posterior tibial  pulses are palpable  bilaterally.  Capillary return is within normal limits  bilaterally. Temperature is within normal limits  bilaterally.  Neurologic  Senn-Weinstein monofilament wire test within normal limits  bilaterally. Muscle power within normal limits bilaterally.  Nails Thick disfigured discolored nails with subungual debris  from hallux to fifth toes bilaterally. No evidence of bacterial infection or drainage bilaterally.  Orthopedic  No limitations of motion  feet .  No crepitus or effusions noted.  No bony pathology or digital deformities noted.  Skin  normotropic skin with no porokeratosis noted bilaterally.  No signs of infections or ulcers noted.     Onychomycosis  Pain in right toes  Pain in left toes  Consent was obtained for treatment procedures.   Mechanical debridement of nails 1-5  bilaterally performed with a nail nipper.  Filed with dremel without incident.    Return office visit  10 weeks                    Told patient to return for periodic foot care and evaluation due to potential at risk complications.   Libra Gatz DPM   

## 2022-01-28 ENCOUNTER — Telehealth: Payer: Self-pay | Admitting: Family Medicine

## 2022-01-28 DIAGNOSIS — H9190 Unspecified hearing loss, unspecified ear: Secondary | ICD-10-CM

## 2022-01-28 NOTE — Telephone Encounter (Signed)
Pt called stating that he was looking to get a referral to the following doctor for his hearing:  Dr. Karma Greaser, audiologist [place], [place],  [address]  P: [phone]

## 2022-01-28 NOTE — Telephone Encounter (Signed)
Referral ordered

## 2022-03-19 ENCOUNTER — Ambulatory Visit (INDEPENDENT_AMBULATORY_CARE_PROVIDER_SITE_OTHER): Payer: Medicare Other | Admitting: Podiatry

## 2022-03-19 ENCOUNTER — Encounter: Payer: Self-pay | Admitting: Podiatry

## 2022-03-19 DIAGNOSIS — E119 Type 2 diabetes mellitus without complications: Secondary | ICD-10-CM

## 2022-03-19 DIAGNOSIS — B351 Tinea unguium: Secondary | ICD-10-CM | POA: Diagnosis not present

## 2022-03-19 NOTE — Progress Notes (Signed)
This patient returns to my office for at risk foot care.  This patient requires this care by a professional since this patient will be at risk due to having diabetes.  This patient is unable to cut nails himself since the patient cannot reach his nails.These nails are painful walking and wearing shoes.  This patient presents for at risk foot care today.  General Appearance  Alert, conversant and in no acute stress.  Vascular  Dorsalis pedis and posterior tibial  pulses are palpable  bilaterally.  Capillary return is within normal limits  bilaterally. Temperature is within normal limits  bilaterally.  Neurologic  Senn-Weinstein monofilament wire test within normal limits  bilaterally. Muscle power within normal limits bilaterally.  Nails Thick disfigured discolored nails with subungual debris  from hallux to fifth toes bilaterally. No evidence of bacterial infection or drainage bilaterally.  Orthopedic  No limitations of motion  feet .  No crepitus or effusions noted.  No bony pathology or digital deformities noted.  Skin  normotropic skin with no porokeratosis noted bilaterally.  No signs of infections or ulcers noted.     Onychomycosis  Pain in right toes  Pain in left toes  Consent was obtained for treatment procedures.   Mechanical debridement of nails 1-5  bilaterally performed with a nail nipper.  Filed with dremel without incident.    Return office visit  10 weeks                    Told patient to return for periodic foot care and evaluation due to potential at risk complications.   Joan Avetisyan DPM   

## 2022-03-20 ENCOUNTER — Other Ambulatory Visit: Payer: Self-pay | Admitting: Family Medicine

## 2022-03-24 ENCOUNTER — Other Ambulatory Visit: Payer: Self-pay | Admitting: Family Medicine

## 2022-05-04 ENCOUNTER — Other Ambulatory Visit: Payer: Self-pay | Admitting: Family Medicine

## 2022-05-28 ENCOUNTER — Ambulatory Visit (INDEPENDENT_AMBULATORY_CARE_PROVIDER_SITE_OTHER): Payer: Medicare Other | Admitting: Podiatry

## 2022-05-28 ENCOUNTER — Encounter: Payer: Self-pay | Admitting: Podiatry

## 2022-05-28 DIAGNOSIS — B351 Tinea unguium: Secondary | ICD-10-CM | POA: Diagnosis not present

## 2022-05-28 DIAGNOSIS — E119 Type 2 diabetes mellitus without complications: Secondary | ICD-10-CM

## 2022-05-28 NOTE — Progress Notes (Signed)
This patient returns to my office for at risk foot care.  This patient requires this care by a professional since this patient will be at risk due to having diabetes.  This patient is unable to cut nails himself since the patient cannot reach his nails.These nails are painful walking and wearing shoes.  This patient presents for at risk foot care today.  General Appearance  Alert, conversant and in no acute stress.  Vascular  Dorsalis pedis and posterior tibial  pulses are palpable  bilaterally.  Capillary return is within normal limits  bilaterally. Temperature is within normal limits  bilaterally.  Neurologic  Senn-Weinstein monofilament wire test within normal limits  bilaterally. Muscle power within normal limits bilaterally.  Nails Thick disfigured discolored nails with subungual debris  from hallux to fifth toes bilaterally. No evidence of bacterial infection or drainage bilaterally.  Orthopedic  No limitations of motion  feet .  No crepitus or effusions noted.  No bony pathology or digital deformities noted.  Skin  normotropic skin with no porokeratosis noted bilaterally.  No signs of infections or ulcers noted.     Onychomycosis  Pain in right toes  Pain in left toes  Consent was obtained for treatment procedures.   Mechanical debridement of nails 1-5  bilaterally performed with a nail nipper.  Filed with dremel without incident.    Return office visit  10 weeks                    Told patient to return for periodic foot care and evaluation due to potential at risk complications.   Nalia Honeycutt DPM   

## 2022-05-30 ENCOUNTER — Other Ambulatory Visit: Payer: Self-pay | Admitting: Family Medicine

## 2022-08-29 ENCOUNTER — Ambulatory Visit (INDEPENDENT_AMBULATORY_CARE_PROVIDER_SITE_OTHER): Payer: Medicare Other | Admitting: Podiatry

## 2022-08-29 ENCOUNTER — Encounter: Payer: Self-pay | Admitting: Podiatry

## 2022-08-29 DIAGNOSIS — E119 Type 2 diabetes mellitus without complications: Secondary | ICD-10-CM | POA: Diagnosis not present

## 2022-08-29 DIAGNOSIS — B351 Tinea unguium: Secondary | ICD-10-CM | POA: Diagnosis not present

## 2022-08-29 NOTE — Progress Notes (Signed)
This patient returns to my office for at risk foot care.  This patient requires this care by a professional since this patient will be at risk due to having diabetes.  This patient is unable to cut nails himself since the patient cannot reach his nails.These nails are painful walking and wearing shoes.  This patient presents for at risk foot care today.  General Appearance  Alert, conversant and in no acute stress.  Vascular  Dorsalis pedis and posterior tibial  pulses are palpable  bilaterally.  Capillary return is within normal limits  bilaterally. Temperature is within normal limits  bilaterally.  Neurologic  Senn-Weinstein monofilament wire test within normal limits  bilaterally. Muscle power within normal limits bilaterally.  Nails Thick disfigured discolored nails with subungual debris  from hallux to fifth toes bilaterally. No evidence of bacterial infection or drainage bilaterally.  Orthopedic  No limitations of motion  feet .  No crepitus or effusions noted.  No bony pathology or digital deformities noted.  Skin  normotropic skin with no porokeratosis noted bilaterally.  No signs of infections or ulcers noted.     Onychomycosis  Pain in right toes  Pain in left toes  Consent was obtained for treatment procedures.   Mechanical debridement of nails 1-5  bilaterally performed with a nail nipper.  Filed with dremel without incident.    Return office visit  10 weeks                    Told patient to return for periodic foot care and evaluation due to potential at risk complications.   Gardiner Barefoot DPM

## 2022-08-31 ENCOUNTER — Other Ambulatory Visit: Payer: Self-pay | Admitting: Family Medicine

## 2022-09-01 ENCOUNTER — Ambulatory Visit (INDEPENDENT_AMBULATORY_CARE_PROVIDER_SITE_OTHER): Payer: Medicare Other | Admitting: Medical

## 2022-09-01 ENCOUNTER — Encounter: Payer: Self-pay | Admitting: Medical

## 2022-09-01 ENCOUNTER — Other Ambulatory Visit: Payer: Self-pay

## 2022-09-01 VITALS — BP 142/64 | HR 56 | Ht 72.0 in | Wt 173.8 lb

## 2022-09-01 DIAGNOSIS — T148XXA Other injury of unspecified body region, initial encounter: Secondary | ICD-10-CM

## 2022-09-01 DIAGNOSIS — E119 Type 2 diabetes mellitus without complications: Secondary | ICD-10-CM

## 2022-09-01 DIAGNOSIS — Z23 Encounter for immunization: Secondary | ICD-10-CM

## 2022-09-01 DIAGNOSIS — S40811A Abrasion of right upper arm, initial encounter: Secondary | ICD-10-CM | POA: Diagnosis not present

## 2022-09-01 LAB — COMPREHENSIVE METABOLIC PANEL
ALT: 13 U/L (ref 0–53)
AST: 15 U/L (ref 0–37)
Albumin: 4.1 g/dL (ref 3.5–5.2)
Alkaline Phosphatase: 70 U/L (ref 39–117)
BUN: 21 mg/dL (ref 6–23)
CO2: 25 mEq/L (ref 19–32)
Calcium: 9.7 mg/dL (ref 8.4–10.5)
Chloride: 109 mEq/L (ref 96–112)
Creatinine, Ser: 1.37 mg/dL (ref 0.40–1.50)
GFR: 47 mL/min — ABNORMAL LOW (ref >60.00)
Glucose, Bld: 79 mg/dL (ref 70–99)
Potassium: 4.7 mEq/L (ref 3.5–5.1)
Sodium: 142 mEq/L (ref 135–145)
Total Bilirubin: 0.4 mg/dL (ref 0.2–1.2)
Total Protein: 6.6 g/dL (ref 6.0–8.3)

## 2022-09-01 LAB — HEMOGLOBIN A1C: Hgb A1c MFr Bld: 7.7 % — ABNORMAL HIGH (ref 4.6–6.5)

## 2022-09-01 MED ORDER — SILVER SULFADIAZINE 1 % EX CREA: 1.0000 | TOPICAL_CREAM | Freq: Two times a day (BID) | CUTANEOUS | 0 refills | Status: DC

## 2022-09-01 MED ORDER — VALACYCLOVIR HCL 1 G PO TABS: 1000.0000 mg | ORAL_TABLET | Freq: Every day | ORAL | 2 refills | Status: DC

## 2022-09-01 NOTE — Progress Notes (Signed)
Subjective:    Patient ID: [name], male    DOB: [birth year], 88 y.o.   MRN: [id number]  HPI  Pt in for Saturday moving extension ladder. The extension ladder edges are sharp. The ladder slid down and scraped arm thru sweat shirt. He cleaned area with water and then put topical antibiotic.  Pt not up to date on tetanus.   No yellow or creamy dc. No fevers, no chills or sweats.  Pt is diabetic. No A1c check almost a year.   Review of Systems  Constitutional:  Negative for chills, fatigue and fever.  Respiratory:  Negative for cough, chest tightness, shortness of breath and wheezing.   Cardiovascular:  Negative for chest pain and palpitations.  Gastrointestinal:  Negative for abdominal pain.  Skin:        Abrasion. Friction injury. Peeled back skin.  Neurological:  Negative for dizziness, light-headedness and headaches.  Hematological:  Negative for adenopathy. Does not bruise/bleed easily.  Psychiatric/Behavioral:  Negative for behavioral problems and confusion.     Past Medical History:  Diagnosis Date   Allergy    mild   Arthritis    fingers, hip   BPH (benign prostatic hypertrophy)    Cataract    removed both eyes   Complication of anesthesia    gases nasally causes sneezing    Diabetes mellitus 2000   type 2   Diabetes mellitus type 2 in nonobese ([place]) 06/19/2009   Qualifier: Diagnosis of  By: Wynona Luna     GERD (gastroesophageal reflux disease)    Glaucoma    followed by Dr Lanell Matar at [facility]   History of kidney stones    Hyperlipidemia    Left hip pain 03/14/2012   Malignant neoplasm of prostate ([place]) 03/15/2019   Medicare annual wellness visit, subsequent 12/31/2015   Onychomycosis 09/17/2015   Prostate cancer ([place])    Tubular adenoma of colon 2003   Vasomotor rhinitis 05/02/2013     Social History   Socioeconomic History   Marital status: Married    Spouse name: [place]   Number of children: 0   Years of education: Not on  file   Highest education level: Not on file  Occupational History   Occupation: semi retired    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 exercises regularly with aerobic, stretch and weights  Other Topics Concern   Not on file  Social History Narrative   Not on file   Social Determinants of Health   Financial Resource Strain: Not on file  Food Insecurity: Not on file  Transportation Needs: Not on file  Physical Activity: Not on file  Stress: Not on file  Social Connections: Not on file  Intimate Partner Violence: Not on file    Past Surgical History:  Procedure Laterality Date   APPENDECTOMY  1943   CATARACT EXTRACTION, BILATERAL     2019   COLONOSCOPY     CYSTOSCOPY WITH INSERTION OF UROLIFT     CYSTOSCOPY WITH LITHOLAPAXY N/A 10/10/2021   Procedure: CYSTOSCOPY WITH LITHOLAPAXY;  Surgeon: Franchot Gallo, MD;  Location: [facility];  Service: Urology;  Laterality: N/A;  EYE SURGERY     laser surgery on left numerous times.    HOLMIUM LASER APPLICATION N/A 123456   Procedure: HOLMIUM LASER APPLICATION;  Surgeon: Franchot Gallo, MD;  Location: [facility];  Service: Urology;  Laterality: N/A;   POLYPECTOMY     TOTAL HIP ARTHROPLASTY Left 11/15/2019   Procedure: TOTAL HIP ARTHROPLASTY ANTERIOR APPROACH;  Surgeon: Paralee Cancel, MD;  Location: [facility];  Service: Orthopedics;  Laterality: Left;  70 mins   TRABECULECTOMY Left 09/04/2016    Family History  Problem Relation Age of Onset   Obstructive Sleep Apnea Sister    Heart disease Father        MI   Diabetes Father    Arthritis Brother        knees   Heart disease Brother        a fib   Breast  cancer Sister    Colon cancer Paternal Uncle 34   Colon polyps Neg Hx    Esophageal cancer Neg Hx    Rectal cancer Neg Hx    Stomach cancer Neg Hx    Prostate cancer Neg Hx     Allergies  Allergen Reactions   Levaquin [Levofloxacin In D5w] Hives    Current Outpatient Medications on File Prior to Visit  Medication Sig Dispense Refill   alfuzosin (UROXATRAL) 10 MG 24 hr tablet Take 10 mg by mouth daily.     brimonidine-timolol (COMBIGAN) 0.2-0.5 % ophthalmic solution Place 1 drop into the right eye 2 (two) times daily.      brinzolamide (AZOPT) 1 % ophthalmic suspension Place 1 drop into the right eye 3 (three) times daily.     cholecalciferol (VITAMIN D3) 25 MCG (1000 UNIT) tablet Take 1,000 Units by mouth daily.     famotidine (PEPCID) 20 MG tablet Take 1 tablet (20 mg total) by mouth daily. 90 tablet 1   glucose blood (TRUE METRIX BLOOD GLUCOSE TEST) test strip USE TO TEST BLOOD SUGAR ONCE DAILY 100 strip 12   glyBURIDE (DIABETA) 5 MG tablet TAKE 2 TABLETS TWO TIMES A DAY WITH MEALS 360 tablet 0   ipratropium (ATROVENT) 0.03 % nasal spray Place 2 sprays into both nostrils as needed for rhinitis. 30 mL 3   JANUVIA 100 MG tablet TAKE 1 TABLET DAILY 90 tablet 1   JARDIANCE 10 MG TABS tablet TAKE 2 TABLETS DAILY 180 tablet 1   metFORMIN (GLUCOPHAGE) 1000 MG tablet TAKE 1 TABLET TWICE A DAY 180 tablet 1   Multiple Vitamin (MULTIVITAMIN WITH MINERALS) TABS tablet Take 1 tablet by mouth daily.     simvastatin (ZOCOR) 10 MG tablet TAKE 1 TABLET AT BEDTIME 90 tablet 1   travoprost, benzalkonium, (TRAVATAN) 0.004 % ophthalmic solution Place 1 drop into the right eye at bedtime.     [place] LANCETS MISC Use as directed once daily to check blood sugar.  DX E11.9 100 each 6   ciclopirox (PENLAC) 8 % solution Apply topically at bedtime. Apply over nail and surrounding skin. Apply daily over previous coat. After seven (7) days, may remove with alcohol and continue cycle. 6.6 mL 0   fluticasone  (FLONASE) 50 MCG/ACT nasal spray SHAKE LIQUID AND USE 2 SPRAYS IN EACH NOSTRIL DAILY (Patient not taking: Reported on 09/01/2022)     No current facility-administered medications on file prior to visit.    BP (!) 142/64 (BP Location: Left Arm, Patient Position: Sitting, Cuff Size: Normal)   Pulse (!) 56   Ht 6' (1.829  m)   Wt 173 lb 12.8 oz (78.8 kg)   SpO2 100%   BMI 23.57 kg/m        Objective:   Physical Exam  General- No acute distress. Pleasant patient. Neck- Full range of motion, no jvd Lungs- Clear, even and unlabored. Heart- regular rate and rhythm. Neurologic- CNII- XII grossly intact.  Rt forearm- 7 cm x 3.5 cm. Epidermis peeled back abrasion. No yellow dc.      Assessment & Plan:   Patient Instructions  Rt arm wound abrasion friction type injury. Update Td today. Rx silvadene twice daily. Get wound culture today.  Non stick dressing apply daily and ace wrap.  Follow up this Thursday to recheck area. I think good ideas since about to go out of town on Friday morning.

## 2022-09-01 NOTE — Patient Instructions (Addendum)
Rt arm wound abrasion friction type injury. Update Td today. Rx silvadene twice daily. Get wound culture today.  Non stick dressing apply daily and ace wrap.  Follow up this Thursday to recheck area. I think good ideas since about to go out of town on Friday morning.  For diabetes- get cmp and A1c today. See if modification need to be made. Continue glyburide, januvia, jardiacne and metformin.

## 2022-09-04 ENCOUNTER — Ambulatory Visit (INDEPENDENT_AMBULATORY_CARE_PROVIDER_SITE_OTHER): Payer: Medicare Other | Admitting: Medical

## 2022-09-04 VITALS — BP 126/60 | HR 60 | Temp 98.5°F | Resp 18 | Ht 72.0 in | Wt 173.0 lb

## 2022-09-04 DIAGNOSIS — S50811A Abrasion of right forearm, initial encounter: Secondary | ICD-10-CM

## 2022-09-04 DIAGNOSIS — T148XXA Other injury of unspecified body region, initial encounter: Secondary | ICD-10-CM

## 2022-09-04 DIAGNOSIS — S51811D Laceration without foreign body of right forearm, subsequent encounter: Secondary | ICD-10-CM | POA: Diagnosis not present

## 2022-09-04 MED ORDER — VALACYCLOVIR HCL 1 G PO TABS: 1000.0000 mg | ORAL_TABLET | Freq: Every day | ORAL | 3 refills | Status: DC

## 2022-09-04 NOTE — Progress Notes (Signed)
Subjective:    Patient ID: [name], male    DOB: 02-04-1935, 88 y.o.   MRN: [id number]  HPI   Pt in follow up on rt forearm wound. Pt wound culture was negative. But he has some brownish/yellowish. No fever, no chills or sweats. He has been applying silvadene.  He is about to travel to [place].   On review pt needed valtrex refill.     Review of Systems  Constitutional:  Negative for chills, fatigue and fever.  HENT:  Negative for congestion.   Respiratory:  Negative for cough, chest tightness, shortness of breath and wheezing.   Cardiovascular:  Negative for chest pain and palpitations.  Gastrointestinal:  Negative for abdominal pain, blood in stool and constipation.  Musculoskeletal:  Negative for back pain and gait problem.  Skin:        See hpi and wound on exam.  Neurological:  Negative for dizziness, seizures, syncope, weakness, light-headedness and headaches.  Hematological:  Negative for adenopathy. Does not bruise/bleed easily.  Psychiatric/Behavioral:  Negative for behavioral problems and decreased concentration.     Past Medical History:  Diagnosis Date   Allergy    mild   Arthritis    fingers, hip   BPH (benign prostatic hypertrophy)    Cataract    removed both eyes   Complication of anesthesia    gases nasally causes sneezing    Diabetes mellitus 2000   type 2   Diabetes mellitus type 2 in nonobese ([place]) 06/19/2009   Qualifier: Diagnosis of  By: Wynona Luna     GERD (gastroesophageal reflux disease)    Glaucoma    follow[facility] by Dr Lanell Matar at [facility]   History of kidney stones    Hyperlipidemia    Left hip pain 03/14/2012   Malignant neoplasm of prostate ([place]) 03/15/2019   Medicare annual wellness visit, subsequent 12/31/2015   Onychomycosis 09/17/2015   Prostate cancer ([place])    Tubular adenoma of colon 2003   Vasomotor rhinitis 05/02/2013     Social History   Socioeconomic History   Marital status: Married    Spouse  name: [place]   Number of children: 0   Years of education: Not on file   Highest education level: Not on file  Occupational History   Occupation: semi retired    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 exercises regularly with aerobic, stretch and weights  Other Topics Concern   Not on file  Social History Narrative   Not on file   Social Determinants of Health   Financial Resource Strain: Not on file  Food Insecurity: Not on file  Transportation Needs: Not on file  Physical Activity: Not on file  Stress: Not on file  Social Connections: Not on file  Intimate Partner Violence: Not on file    Past Surgical History:  Procedure Laterality Date   APPENDECTOMY  1943   CATARACT EXTRACTION, BILATERAL     2019   COLONOSCOPY     CYSTOSCOPY WITH INSERTION OF UROLIFT     CYSTOSCOPY WITH LITHOLAPAXY N/A 10/10/2021   Procedure: CYSTOSCOPY WITH LITHOLAPAXY;  Surgeon: Franchot Gallo, MD;  Location: [facility];  Service: Urology;  Laterality: N/A;   EYE SURGERY     laser surgery on left numerous times.    HOLMIUM LASER APPLICATION N/A [id number]   Procedure: HOLMIUM LASER APPLICATION;  Surgeon: Franchot Gallo, MD;  Location: [facility];  Service: Urology;  Laterality: N/A;   POLYPECTOMY     TOTAL HIP ARTHROPLASTY Left 11/15/2019   Procedure: TOTAL HIP ARTHROPLASTY ANTERIOR APPROACH;  Surgeon: Paralee Cancel, MD;  Location: [facility];  Service: Orthopedics;  Laterality: Left;  70 mins   TRABECULECTOMY Left 09/04/2016    Family History  Problem Relation Age of Onset   Obstructive Sleep Apnea Sister    Heart disease Father        MI   Diabetes Father    Arthritis Brother         knees   Heart disease Brother        a fib   Breast cancer Sister    Colon cancer Paternal Uncle 56   Colon polyps Neg Hx    Esophageal cancer Neg Hx    Rectal cancer Neg Hx    Stomach cancer Neg Hx    Prostate cancer Neg Hx     Allergies  Allergen Reactions   Levaquin [Levofloxacin In D5w] Hives    Current Outpatient Medications on File Prior to Visit  Medication Sig Dispense Refill   alfuzosin (UROXATRAL) 10 MG 24 hr tablet Take 10 mg by mouth daily.     brimonidine-timolol (COMBIGAN) 0.2-0.5 % ophthalmic solution Place 1 drop into the right eye 2 (two) times daily.      brinzolamide (AZOPT) 1 % ophthalmic suspension Place 1 drop into the right eye 3 (three) times daily.     cholecalciferol (VITAMIN D3) 25 MCG (1000 UNIT) tablet Take 1,000 Units by mouth daily.     ciclopirox (PENLAC) 8 % solution Apply topically at bedtime. Apply over nail and surrounding skin. Apply daily over previous coat. After seven (7) days, may remove with alcohol and continue cycle. 6.6 mL 0   empagliflozin (JARDIANCE) 10 MG TABS tablet TAKE 2 TABLETS DAILY 60 tablet 0   famotidine (PEPCID) 20 MG tablet Take 1 tablet (20 mg total) by mouth daily. 90 tablet 1   fluticasone (FLONASE) 50 MCG/ACT nasal spray SHAKE LIQUID AND USE 2 SPRAYS IN EACH NOSTRIL DAILY (Patient not taking: Reported on 09/01/2022)     glucose blood (TRUE METRIX BLOOD GLUCOSE TEST) test strip USE TO TEST BLOOD SUGAR ONCE DAILY 100 strip 12   glyBURIDE (DIABETA) 5 MG tablet Take 2 tablets (10 mg total) by mouth 2 (two) times daily with a meal. 120 tablet 0   ipratropium (ATROVENT) 0.03 % nasal spray USE 2 SPRAYS IN EACH       NOSTRIL AS NEEDED FOR      RHINITIS 30 mL 0   JANUVIA 100 MG tablet TAKE 1 TABLET DAILY 90 tablet 1   metFORMIN (GLUCOPHAGE) 1000 MG tablet TAKE 1 TABLET TWICE A DAY 180 tablet 1   Multiple Vitamin (MULTIVITAMIN WITH MINERALS) TABS tablet Take 1 tablet by mouth daily.     silver sulfADIAZINE (SILVADENE) 1 %  cream Apply 1 Application topically 2 (two) times daily. 50 g 0   simvastatin (ZOCOR) 10 MG tablet TAKE 1 TABLET AT BEDTIME 30 tablet 0   travoprost, benzalkonium, (TRAVATAN) 0.004 % ophthalmic solution Place 1 drop into the right eye at bedtime.     [place] LANCETS MISC Use as directed once daily  to check blood sugar.  DX E11.9 100 each 6   No current facility-administered medications on file prior to visit.    BP 126/60   Pulse 60   Temp 98.5 F (36.9 C)   Resp 18   Ht 6' (1.829 m)   Wt 173 lb (78.5 kg)   SpO2 99%   BMI 23.46 kg/m        Objective:   Physical Exam  General- No acute distress. Pleasant patient. Neck- Full range of motion, no jvd Lungs- Clear, even and unlabored. Heart- regular rate and rhythm. Neurologic- CNII- XII grossly intact.  Rt forearm- 7 cm x 3.5 cm. Epidermis peeled back abrasion. Some yellow/brown dc. No redness or warmth.      Assessment & Plan:   Patient Instructions  Rt forearm wound overall looks like doing well but colored discharge has developed. Will get wound culture again.   Changed dressing today in office after got wound culture and cleaned wound.  If area were to worsen or change let us know. May need oral antibiotic if culture positive or if area looks.  Refilled valtrex today.  Follow up September 15, 2022 or sooner if needed   [employer]

## 2022-09-04 NOTE — Patient Instructions (Addendum)
Rt forearm wound overall looks like doing well but colored discharge has developed. Will get wound culture again though first was negative.  Changed dressing today in office after got wound culture and cleaned wound.  If area were to worsen or change let us know. May need oral antibiotic if culture positive or if area looks.  Refilled valtrex today.  Follow up September 15, 2022 or sooner if needed

## 2022-09-05 LAB — WOUND CULTURE
MICRO NUMBER:: 14613695
RESULT:: NO GROWTH
SPECIMEN QUALITY:: ADEQUATE

## 2022-09-08 LAB — WOUND CULTURE
MICRO NUMBER:: 14632124
RESULT:: NO GROWTH
SPECIMEN QUALITY:: ADEQUATE

## 2022-09-15 ENCOUNTER — Ambulatory Visit (INDEPENDENT_AMBULATORY_CARE_PROVIDER_SITE_OTHER): Payer: Medicare Other | Admitting: Medical

## 2022-09-15 ENCOUNTER — Encounter: Payer: Self-pay | Admitting: Medical

## 2022-09-15 VITALS — BP 135/60 | HR 62 | Temp 97.7°F | Resp 16 | Ht 72.0 in | Wt 172.8 lb

## 2022-09-15 DIAGNOSIS — T148XXA Other injury of unspecified body region, initial encounter: Secondary | ICD-10-CM

## 2022-09-15 DIAGNOSIS — S51811D Laceration without foreign body of right forearm, subsequent encounter: Secondary | ICD-10-CM

## 2022-09-15 NOTE — Progress Notes (Addendum)
Subjective:    Patient ID: [name], male    DOB: [birth year], 88 y.o.   MRN: [id number]  HPI  Pt in for follow up on rt forearm wound.  Iniitially injury was from extension latter edge. Described friction type injury from sharp edge of extension ladder.  Early on advised silvadene. Last week he stopped using silvidene when he was out of town. No pain on movement of forearm.  Pt has non stick dressing that he uses at night. One edge of wound at times will have serosanguinous drainage. Most apparent when wakes in morning.  No fever, no chills or sweats.       Review of Systems  Constitutional:  Negative for chills, fatigue and fever.  Respiratory:  Negative for cough, chest tightness, shortness of breath and wheezing.   Cardiovascular:  Negative for chest pain and palpitations.  Gastrointestinal:  Negative for abdominal pain, constipation and nausea.  Genitourinary:  Negative for dysuria, flank pain and genital sores.  Musculoskeletal:  Negative for back pain, myalgias and neck stiffness.  Skin:  Negative for rash.       See hpi.  Neurological:  Negative for dizziness and headaches.  Hematological:  Negative for adenopathy. Does not bruise/bleed easily.  Psychiatric/Behavioral:  Negative for behavioral problems and confusion.    Past Medical History:  Diagnosis Date   Allergy    mild   Arthritis    fingers, hip   BPH (benign prostatic hypertrophy)    Cataract    removed both eyes   Complication of anesthesia    gases nasally causes sneezing    Diabetes mellitus 2000   type 2   Diabetes mellitus type 2 in nonobese ([place]) 06/19/2009   Qualifier: Diagnosis of  By: Wynona Luna     GERD (gastroesophageal reflux disease)    Glaucoma    follow[facility] by Dr Lanell [facility]ar at [facility]   History of kidney stones    Hyperlipidemia    Left hip pain 03/14/2012   Malignant neoplasm of prostate ([place]) 03/15/2019   Medicare annual wellness visit, subsequent 12/31/2015    Onychomycosis 09/17/2015   Prostate cancer ([place])    Tubular adenoma of colon 2003   Vasomotor rhinitis 05/02/2013     Social History   Socioeconomic History   Marital status: Married    Spouse name: [place]   Number of children: 0   Years of education: Not on file   Highest education level: Not on file  Occupational History   Occupation: semi retired    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 exercises regularly with aerobic, stretch and weights  Other Topics Concern   Not on file  Social History Narrative   Not on file   Social Determinants of Health   Financial Resource Strain: Not on file  Food Insecurity: Not on file  Transportation Needs: Not on file  Physical Activity: Not on file  Stress: Not on file  Social Connections: Not on file  Intimate Partner Violence: Not on file    Past Surgical History:  Procedure Laterality Date   APPENDECTOMY  1943   CATARACT EXTRACTION, BILATERAL     2019  COLONOSCOPY     CYSTOSCOPY WITH INSERTION OF UROLIFT     CYSTOSCOPY WITH LITHOLAPAXY N/A 10/10/2021   Procedure: CYSTOSCOPY WITH LITHOLAPAXY;  Surgeon: Franchot Gallo, MD;  Location: [facility];  Service: Urology;  Laterality: N/A;   EYE SURGERY     laser surgery on left numerous times.    HOLMIUM LASER APPLICATION N/A 123456   Procedure: HOLMIUM LASER APPLICATION;  Surgeon: Franchot Gallo, MD;  Location: [facility];  Service: Urology;  Laterality: N/A;   POLYPECTOMY     TOTAL HIP ARTHROPLASTY Left 11/15/2019   Procedure: TOTAL HIP ARTHROPLASTY ANTERIOR APPROACH;  Surgeon: Paralee Cancel, MD;  [facility];  Service: Orthopedics;  Laterality: Left;  70  mins   TRABECULECTOMY Left 09/04/2016    Family History  Problem Relation Age of Onset   Obstructive Sleep Apnea Sister    Heart disease Father        MI   Diabetes Father    Arthritis Brother        knees   Heart disease Brother        a fib   Breast cancer Sister    Colon cancer Paternal Uncle 17   Colon polyps Neg Hx    Esophageal cancer Neg Hx    Rectal cancer Neg Hx    Stomach cancer Neg Hx    Prostate cancer Neg Hx     Allergies  Allergen Reactions   Levaquin [Levofloxacin In D5w] Hives    Current Outpatient Medications on File Prior to Visit  Medication Sig Dispense Refill   alfuzosin (UROXATRAL) 10 MG 24 hr tablet Take 10 mg by mouth daily.     brimonidine-timolol (COMBIGAN) 0.2-0.5 % ophthalmic solution Place 1 drop into the right eye 2 (two) times daily.      brinzolamide (AZOPT) 1 % ophthalmic suspension Place 1 drop into the right eye 3 (three) times daily.     cholecalciferol (VITAMIN D3) 25 MCG (1000 UNIT) tablet Take 1,000 Units by mouth daily.     empagliflozin (JARDIANCE) 10 MG TABS tablet TAKE 2 TABLETS DAILY 60 tablet 0   famotidine (PEPCID) 20 MG tablet Take 1 tablet (20 mg total) by mouth daily. 90 tablet 1   fluticasone (FLONASE) 50 MCG/ACT nasal spray      glucose blood (TRUE METRIX BLOOD GLUCOSE TEST) test strip USE TO TEST BLOOD SUGAR ONCE DAILY 100 strip 12   glyBURIDE (DIABETA) 5 MG tablet Take 2 tablets (10 mg total) by mouth 2 (two) times daily with a meal. 120 tablet 0   ipratropium (ATROVENT) 0.03 % nasal spray USE 2 SPRAYS IN EACH       NOSTRIL AS NEEDED FOR      RHINITIS 30 mL 0   JANUVIA 100 MG tablet TAKE 1 TABLET DAILY 90 tablet 1   metFORMIN (GLUCOPHAGE) 1000 MG tablet TAKE 1 TABLET TWICE A DAY 180 tablet 1   Multiple Vitamin (MULTIVITAMIN WITH MINERALS) TABS tablet Take 1 tablet by mouth daily.     silver sulfADIAZINE (SILVADENE) 1 % cream Apply 1 Application topically 2 (two) times daily. 50 g 0   simvastatin (ZOCOR) 10 MG tablet TAKE  1 TABLET AT BEDTIME 30 tablet 0   travoprost, benzalkonium, (TRAVATAN) 0.004 % ophthalmic solution Place 1 drop into the right eye at bedtime.     valACYclovir (VALTREX) 1000 MG tablet Take 1 tablet (1,000 mg total) by mouth daily. 90 tablet 3   [place] LANCETS MISC Use as directed  once daily to check blood sugar.  DX E11.9 100 each 6   ciclopirox (PENLAC) 8 % solution Apply topically at bedtime. Apply over nail and surrounding skin. Apply daily over previous coat. After seven (7) days, may remove with alcohol and continue cycle. 6.6 mL 0   No current facility-administered medications on file prior to visit.    BP 135/60   Pulse 62   Temp 97.7 F (36.5 C) (Oral)   Resp 16   Ht 6' (1.829 m)   Wt 172 lb 12.8 oz (78.4 kg)   SpO2 95%   BMI 23.44 kg/m           Objective:   Physical Exam General- No acute distress. Pleasant patient. Neck- Full range of motion, no jvd Lungs- Clear, even and unlabored. Heart- regular rate and rhythm. Neurologic- CNII- XII grossly intact.   Rt forearm- 6 cm x 2 cm wound looks dried and clear. No dc. Wound edges healing.     Assessment & Plan:   Patient Instructions  Rt forearm wound healing by secondary intention. Edges of wound are healing and gradually improving. Cultures have been negative. Wound overall looks dryer and smaller. I offered referral to wound care today but declined. Wanted to refer to expedite healing if possible as wound slowly healing. Will follow and make sure continue to make progress..  Can use bacitracin otc at night and use non stick dressing coverage at night. Keep open to air during the day.  If does not continue to heal then would refer as discussed.  Follow up in one week of sooner if needed.

## 2022-09-15 NOTE — Patient Instructions (Addendum)
Rt forearm wound healing by secondary intention. Edges of wound are healing and gradually improving. Cultures have been negative. Wound overall looks dryer and smaller. I offered referral to wound care today but declined. Wanted to refer to expedite healing if possible as wound slowly healing. Will follow and make sure continue to make progress..  Can use bacitracin otc at night and use non stick dressing coverage at night. Keep open to air during the day.  If does not continue to heal then would refer as discussed.  Follow up in one week of sooner if needed.

## 2022-09-22 ENCOUNTER — Encounter: Payer: Self-pay | Admitting: Medical

## 2022-09-22 ENCOUNTER — Ambulatory Visit (INDEPENDENT_AMBULATORY_CARE_PROVIDER_SITE_OTHER): Payer: Medicare Other | Admitting: Medical

## 2022-09-22 VITALS — BP 125/70 | HR 67 | Resp 18 | Ht 72.0 in | Wt 172.4 lb

## 2022-09-22 DIAGNOSIS — S51811D Laceration without foreign body of right forearm, subsequent encounter: Secondary | ICD-10-CM

## 2022-09-22 DIAGNOSIS — T148XXD Other injury of unspecified body region, subsequent encounter: Secondary | ICD-10-CM | POA: Diagnosis not present

## 2022-09-22 DIAGNOSIS — T148XXA Other injury of unspecified body region, initial encounter: Secondary | ICD-10-CM

## 2022-09-22 NOTE — Progress Notes (Signed)
Subjective:    Patient ID: [name], male    DOB: 01/09/1935, 88 y.o.   MRN: [id number]  HPI  Pr in for follow up.  The rt forearm wound is healing very slowly. No yellow dc. The area is cracking open when he uses forearm. No fevers, no chills or sweats. I had offered wound care referral. Pt had declined. I had hopes he would get better. No yellow brown dc. No fevers or chills. Now open to the referral.  Last visit A/P  "Pt in for follow up on rt forearm wound.  Iniitially injury was from extension latter edge. Described friction type injury from sharp edge of extension ladder.   Early on advised silvadene. Last week he stopped using silvidene when he was out of town. No pain on movement of forearm.   Pt has non stick dressing that he uses at night. One edge of wound at times will have serosanguinous drainage. Most apparent when wakes in morning."   Review of Systems  Constitutional:  Negative for chills, fatigue and fever.  Respiratory:  Negative for cough, chest tightness, shortness of breath and wheezing.   Cardiovascular:  Negative for chest pain and palpitations.  Gastrointestinal:  Negative for abdominal pain.  Skin:        See hpi.  Neurological:  Negative for dizziness, numbness and headaches.  Hematological:  Negative for adenopathy. Does not bruise/bleed easily.  Psychiatric/Behavioral:  Negative for behavioral problems, decreased concentration and dysphoric mood.    Past Medical History:  Diagnosis Date   Allergy    mild   Arthritis    fingers, hip   BPH (benign prostatic hypertrophy)    Cataract    removed both eyes   Complication of anesthesia    gases nasally causes sneezing    Diabetes mellitus 2000   type 2   Diabetes mellitus type 2 in nonobese ([place]) 06/19/2009   Qualifier: Diagnosis of  By: Wynona Luna     GERD (gastroesophageal reflux disease)    Glaucoma    follow[facility] by Dr Lanell Matar at [facility]   History of kidney stones     Hyperlipidemia    Left hip pain 03/14/2012   Malignant neoplasm of prostate ([place]) 03/15/2019   Medicare annual wellness visit, subsequent 12/31/2015   Onychomycosis 09/17/2015   Prostate cancer ([place])    Tubular adenoma of colon 2003   Vasomotor rhinitis 05/02/2013     Social History   Socioeconomic History   Marital status: Married    Spouse name: [place]   Number of children: 0   Years of education: Not on file   Highest education level: Not on file  Occupational History   Occupation: semi retired    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 exercises regularly with aerobic, stretch and weights  Other Topics Concern   Not on file  Social History Narrative   Not on file   Social Determinants of Health   Financial Resource Strain: Not on file  Food Insecurity: Not on file  Transportation Needs: Not on file  Physical Activity: Not on file  Stress: Not on file  Social Connections: Not on  file  Intimate Partner Violence: Not on file    Past Surgical History:  Procedure Laterality Date   APPENDECTOMY  1943   CATARACT EXTRACTION, BILATERAL     2019   COLONOSCOPY     CYSTOSCOPY WITH INSERTION OF UROLIFT     CYSTOSCOPY WITH LITHOLAPAXY N/A 10/10/2021   Procedure: CYSTOSCOPY WITH LITHOLAPAXY;  Surgeon: Franchot Gallo, MD;  Location: [facility];  Service: Urology;  Laterality: N/A;   EYE SURGERY     laser surgery on left numerous times.    HOLMIUM LASER APPLICATION N/A 123456   Procedure: HOLMIUM LASER APPLICATION;  Surgeon: Franchot Gallo, MD;  Location: [facility];  Service: Urology;  Laterality: N/A;   POLYPECTOMY     TOTAL HIP ARTHROPLASTY Left  11/15/2019   Procedure: TOTAL HIP ARTHROPLASTY ANTERIOR APPROACH;  Surgeon: Paralee Cancel, MD;  Location: [facility];  Service: Orthopedics;  Laterality: Left;  70 mins   TRABECULECTOMY Left 09/04/2016    Family History  Problem Relation Age of Onset   Obstructive Sleep Apnea Sister    Heart disease Father        MI   Diabetes Father    Arthritis Brother        knees   Heart disease Brother        a fib   Breast cancer Sister    Colon cancer Paternal Uncle 33   Colon polyps Neg Hx    Esophageal cancer Neg Hx    Rectal cancer Neg Hx    Stomach cancer Neg Hx    Prostate cancer Neg Hx     Allergies  Allergen Reactions   Levaquin [Levofloxacin In D5w] Hives    Current Outpatient Medications on File Prior to Visit  Medication Sig Dispense Refill   alfuzosin (UROXATRAL) 10 MG 24 hr tablet Take 10 mg by mouth daily.     brimonidine-timolol (COMBIGAN) 0.2-0.5 % ophthalmic solution Place 1 drop into the right eye 2 (two) times daily.      brinzolamide (AZOPT) 1 % ophthalmic suspension Place 1 drop into the right eye 3 (three) times daily.     cholecalciferol (VITAMIN D3) 25 MCG (1000 UNIT) tablet Take 1,000 Units by mouth daily.     ciclopirox (PENLAC) 8 % solution Apply topically at bedtime. Apply over nail and surrounding skin. Apply daily over previous coat. After seven (7) days, may remove with alcohol and continue cycle. 6.6 mL 0   empagliflozin (JARDIANCE) 10 MG TABS tablet TAKE 2 TABLETS DAILY 60 tablet 0   famotidine (PEPCID) 20 MG tablet Take 1 tablet (20 mg total) by mouth daily. 90 tablet 1   fluticasone (FLONASE) 50 MCG/ACT nasal spray      glucose blood (TRUE METRIX BLOOD GLUCOSE TEST) test strip USE TO TEST BLOOD SUGAR ONCE DAILY 100 strip 12   glyBURIDE (DIABETA) 5 MG tablet Take 2 tablets (10 mg total) by mouth 2 (two) times daily with a meal. 120 tablet 0   ipratropium (ATROVENT) 0.03 % nasal spray USE 2 SPRAYS IN EACH       NOSTRIL AS NEEDED FOR      RHINITIS 30 mL 0    JANUVIA 100 MG tablet TAKE 1 TABLET DAILY 90 tablet 1   metFORMIN (GLUCOPHAGE) 1000 MG tablet TAKE 1 TABLET TWICE A DAY 180 tablet 1   Multiple Vitamin (MULTIVITAMIN WITH MINERALS) TABS tablet Take 1 tablet by mouth daily.     silver sulfADIAZINE (SILVADENE) 1 % cream Apply 1  Application topically 2 (two) times daily. 50 g 0   simvastatin (ZOCOR) 10 MG tablet TAKE 1 TABLET AT BEDTIME 30 tablet 0   travoprost, benzalkonium, (TRAVATAN) 0.004 % ophthalmic solution Place 1 drop into the right eye at bedtime.     valACYclovir (VALTREX) 1000 MG tablet Take 1 tablet (1,000 mg total) by mouth daily. 90 tablet 3   [facility] LANCETS MISC Use as directed once daily to check blood sugar.  DX E11.9 100 each 6   No current facility-administered medications on file prior to visit.    BP 125/70   Pulse 67   Resp 18   Ht 6' (1.829 m)   Wt 172 lb 6.4 oz (78.2 kg)   SpO2 99%   BMI 23.38 kg/m           Objective:   Physical Exam  General- No acute distress. Pleasant patient. Neurologic- CNII- XII grossly intact.  Rt forearm- 4 cm x 1.5 cm. Epidermis peeled back abrasion. Some yellow/brown dc. No redness or warmth.      Assessment & Plan:   Patient Instructions  Rt forearm wound healing very slowly. Does not appear to infected presently but healing very slowly. Keep area covered at night. Can leave open to air when possible during the day. If any recurrent yellow dc restart silvadene and let me know.  If you don't get called by wound care let me know by this Thursday or Friday.  Follow up as regularly scheduled with pcp or sooner if needed.    Mackie Pai, PA-C

## 2022-09-22 NOTE — Patient Instructions (Signed)
Rt forearm wound healing very slowly. Does not appear to infected presently but healing very slowly. Keep area covered at night. Can leave open to air when possible during the day. If any recurrent yellow dc restart silvadene and let me know.  If you don't get called by wound care let me know by this Thursday or Friday.  Follow up as regularly scheduled with pcp or sooner if needed.

## 2022-10-24 ENCOUNTER — Encounter (HOSPITAL_BASED_OUTPATIENT_CLINIC_OR_DEPARTMENT_OTHER): Payer: Medicare Other | Admitting: General Surgery

## 2022-10-31 ENCOUNTER — Other Ambulatory Visit: Payer: Self-pay | Admitting: Family Medicine

## 2022-11-03 LAB — HM DIABETES EYE EXAM

## 2022-11-07 ENCOUNTER — Ambulatory Visit (INDEPENDENT_AMBULATORY_CARE_PROVIDER_SITE_OTHER): Payer: Medicare Other | Admitting: Podiatry

## 2022-11-07 ENCOUNTER — Encounter: Payer: Self-pay | Admitting: Podiatry

## 2022-11-07 DIAGNOSIS — B351 Tinea unguium: Secondary | ICD-10-CM

## 2022-11-07 DIAGNOSIS — E119 Type 2 diabetes mellitus without complications: Secondary | ICD-10-CM

## 2022-11-07 NOTE — Progress Notes (Signed)
This patient returns to my office for at risk foot care.  This patient requires this care by a professional since this patient will be at risk due to having diabetes.  This patient is unable to cut nails himself since the patient cannot reach his nails.These nails are painful walking and wearing shoes.  This patient presents for at risk foot care today. ° °General Appearance  Alert, conversant and in no acute stress. ° °Vascular  Dorsalis pedis and posterior tibial  pulses are palpable  bilaterally.  Capillary return is within normal limits  bilaterally. Temperature is within normal limits  bilaterally. ° °Neurologic  Senn-Weinstein monofilament wire test within normal limits  bilaterally. Muscle power within normal limits bilaterally. ° °Nails Thick disfigured discolored nails with subungual debris  from hallux to fifth toes bilaterally. No evidence of bacterial infection or drainage bilaterally. ° °Orthopedic  No limitations of motion  feet .  No crepitus or effusions noted.  No bony pathology or digital deformities noted. ° °Skin  normotropic skin with no porokeratosis noted bilaterally.  No signs of infections or ulcers noted.    ° °Onychomycosis  Pain in right toes  Pain in left toes ° °Consent was obtained for treatment procedures.   Mechanical debridement of nails 1-5  bilaterally performed with a nail nipper.  Filed with dremel without incident.  ° ° °Return office visit   10 weeks                   Told patient to return for periodic foot care and evaluation due to potential at risk complications. ° ° °Kenston Longton DPM  °

## 2022-11-11 ENCOUNTER — Encounter (HOSPITAL_BASED_OUTPATIENT_CLINIC_OR_DEPARTMENT_OTHER): Payer: Medicare Other | Attending: Internal Medicine | Admitting: Internal Medicine

## 2022-11-11 DIAGNOSIS — Z96642 Presence of left artificial hip joint: Secondary | ICD-10-CM | POA: Insufficient documentation

## 2022-11-11 DIAGNOSIS — Z8546 Personal history of malignant neoplasm of prostate: Secondary | ICD-10-CM | POA: Diagnosis not present

## 2022-11-11 DIAGNOSIS — E1136 Type 2 diabetes mellitus with diabetic cataract: Secondary | ICD-10-CM | POA: Insufficient documentation

## 2022-11-11 DIAGNOSIS — S50811S Abrasion of right forearm, sequela: Secondary | ICD-10-CM | POA: Insufficient documentation

## 2022-11-11 DIAGNOSIS — X58XXXS Exposure to other specified factors, sequela: Secondary | ICD-10-CM | POA: Insufficient documentation

## 2022-11-11 DIAGNOSIS — L03113 Cellulitis of right upper limb: Secondary | ICD-10-CM | POA: Diagnosis not present

## 2022-11-12 NOTE — Progress Notes (Signed)
Mario, Gardner ([id number]) [id number] Page 1 of 5 Visit Report for 11/11/2022 Allergy List Details Patient Name: Date of Service: [name], GEO RGE L. 11/11/2022 8:00 A M Medical Record Number: [id number] Patient Account Number: [id number] Date of Birth/Sex: Treating RN: 07-22-1934 (88 y.o. Tammy Sours Primary Care Triva Hueber: Danise Edge Other Clinician: Referring Tonye Tancredi: Treating Rumeal Cullipher/Extender: Lawana Chambers Weeks in Treatment: 0 Allergies Active Allergies Levaquin Reaction: hives Allergy Notes Electronic Signature(s) Signed: 11/12/2022 4:03:05 PM By: Redmond Pulling RN, BSN Entered By: Redmond Pulling on 11/11/2022 08:02:30 -------------------------------------------------------------------------------- Arrival Information Details Patient Name: Date of Service: [name], GEO RGE L. 11/11/2022 8:00 A M Medical Record Number: [id number] Patient Account Number: [id number] Date of Birth/Sex: Treating RN: May 25, 1935 (88 y.o. [name] Primary Care Franchesca Veneziano: Danise Edge Other Clinician: Referring Jerard Bays: Treating Dedric Ethington/Extender: Rosalio Loud in Treatment: 0 Visit Information Patient Arrived: Ambulatory Arrival Time: 07:57 Accompanied By: self Transfer Assistance: None Patient Identification Verified: Yes Secondary Verification Process Completed: Yes Electronic Signature(s) Signed: 11/12/2022 4:03:05 PM By: Redmond Pulling RN, BSN Entered By: Redmond Pulling on 11/11/2022 08:00:57 -------------------------------------------------------------------------------- Clinic Level of Care Assessment Details Patient Name: Date of Service: Mario LO [name], GEO RGE L. 11/11/2022 8:00 A M Medical Record Number: [id number] Patient Account Number: [id number] Date of Birth/Sex: Treating RN: [birth year] (88 y.o. [name] Primary Care Mariha Sleeper: Danise Edge Other Clinician: Referring  Donnovan Stamour: Treating Allessandra Bernardi/Extender: Rosalio Loud in Treatment: 0 Clinic Level of Care Assessment Items TOOL 2 Quantity Score X- 1 0 Use when only an EandM is performed on the INITIAL visit ASSESSMENTS - Nursing Assessment / Reassessment X- 1 20 General Physical Exam (combine w/ comprehensive assessment (listed just below) when performed on new pt. evals) X- 1 25 Comprehensive Assessment (HX, ROS, Risk Assessments, Wounds Hx, etc.) ASSESSMENTS - Wound and Skin Assessment / Reassessment [place], AMLIN ([id number]) [id number] Page 2 of 5 X- 1 5 Simple Wound Assessment / Reassessment - one wound []  - 0 Complex Wound Assessment / Reassessment - multiple wounds []  - 0 Dermatologic / Skin Assessment (not related to wound area) ASSESSMENTS - Ostomy and/or Continence Assessment and Care []  - 0 Incontinence Assessment and Management []  - 0 Ostomy Care Assessment and Management (repouching, etc.) PROCESS - Coordination of Care []  - 0 Simple Patient / Family Education for ongoing care []  - 0 Complex (extensive) Patient / Family Education for ongoing care X- 1 10 Staff obtains Chiropractor, Records, T Results / Process Orders est []  - 0 Staff telephones HHA, Nursing Homes / Clarify orders / etc []  - 0 Routine Transfer to another Facility (non-emergent condition) []  - 0 Routine Hospital Admission (non-emergent condition) []  - 0 New Admissions / Manufacturing engineer / Ordering NPWT Apligraf, etc. , []  - 0 Emergency Hospital Admission (emergent condition) X- 1 10 Simple Discharge Coordination []  - 0 Complex (extensive) Discharge Coordination PROCESS - Special Needs []  - 0 Pediatric / Minor Patient Management []  - 0 Isolation Patient Management []  - 0 Hearing / Language / Visual special needs []  - 0 Assessment of Community assistance (transportation, D/C planning, etc.) []  - 0 Additional assistance / Altered  mentation []  - 0 Support Surface(s) Assessment (bed, cushion, seat, etc.) INTERVENTIONS - Wound Cleansing / Measurement []  - 0 Wound Imaging (photographs - any number of wounds) []  - 0 Wound Tracing (instead of photographs) []  - 0 Simple Wound Measurement - one wound []  - 0 Complex Wound Measurement -  multiple wounds []  - 0 Simple Wound Cleansing - one wound []  - 0 Complex Wound Cleansing - multiple wounds INTERVENTIONS - Wound Dressings []  - 0 Small Wound Dressing one or multiple wounds []  - 0 Medium Wound Dressing one or multiple wounds []  - 0 Large Wound Dressing one or multiple wounds []  - 0 Application of Medications - injection INTERVENTIONS - Miscellaneous []  - 0 External ear exam []  - 0 Specimen Collection (cultures, biopsies, blood, body fluids, etc.) []  - 0 Specimen(s) / Culture(s) sent or taken to Lab for analysis []  - 0 Patient Transfer (multiple staff / Nurse, adult / Similar devices) []  - 0 Simple Staple / Suture removal (25 or less) []  - 0 Complex Staple / Suture removal (26 or more) []  - 0 Hypo / Hyperglycemic Management (close monitor of Blood Glucose) Mario, Gardner ([id number]) [id number] Page 3 of 5 []  - 0 Ankle / Brachial Index (ABI) - do not check if billed separately Has the patient been seen at the hospital within the last three years: Yes Total Score: 70 Level Of Care: New/Established - Level 2 Electronic Signature(s) Signed: 11/12/2022 4:03:05 PM By: Redmond Pulling RN, BSN Entered By: Redmond Pulling on 11/11/2022 08:17:15 -------------------------------------------------------------------------------- Encounter Discharge Information Details Patient Name: Date of Service: [name], GEO RGE L. 11/11/2022 8:00 A M Medical Record Number: [id number] Patient Account Number: [id number] Date of Birth/Sex: Treating RN: [birth year] (88 y.o. [name] Primary Care Mikhia Dusek: Danise Edge Other Clinician: Referring  Corda Shutt: Treating Vaidehi Braddy/Extender: Rosalio Loud in Treatment: 0 Encounter Discharge Information Items Discharge Condition: Stable Ambulatory Status: Ambulatory Discharge Destination: Home Transportation: Private Auto Accompanied By: self Schedule Follow-up Appointment: Yes Clinical Summary of Care: Patient Declined Electronic Signature(s) Signed: 11/12/2022 4:03:05 PM By: Redmond Pulling RN, BSN Entered By: Redmond Pulling on 11/11/2022 08:18:04 -------------------------------------------------------------------------------- Lower Extremity Assessment Details Patient Name: Date of Service: Mario LO [name], GEO RGE L. 11/11/2022 8:00 A M Medical Record Number: [id number] Patient Account Number: [id number] Date of Birth/Sex: Treating RN: Apr 03, 1935 (88 y.o. [name] Primary Care De Libman: Danise Edge Other Clinician: Referring Annebelle Bostic: Treating Gricel Copen/Extender: Rosalio Loud in Treatment: 0 Electronic Signature(s) Signed: 11/12/2022 4:03:05 PM By: Redmond Pulling RN, BSN Entered By: Redmond Pulling on 11/11/2022 08:05:27 -------------------------------------------------------------------------------- Multi-Disciplinary Care Plan Details Patient Name: Date of Service: [name], GEO RGE L. 11/11/2022 8:00 A M Medical Record Number: [id number] Patient Account Number: [id number] Date of Birth/Sex: Treating RN: 02-03-1935 (88 y.o. [name] Primary Care Adeli Frost: Danise Edge Other Clinician: Referring Tonilynn Bieker: Treating Brucha Ahlquist/Extender: Rosalio Loud in Treatment: 0 Active Inactive Electronic Signature(s) Signed: 11/12/2022 4:03:05 PM By: Redmond Pulling RN, BSN Entered By: Redmond Pulling on 11/11/2022 08:11:52 Jacqlyn Krauss ([id number]) [id number] Page 4 of 5 -------------------------------------------------------------------------------- Pain Assessment  Details Patient Name: Date of Service: [name], GEO RGE L. 11/11/2022 8:00 A M Medical Record Number: [id number] Patient Account Number: [id number] Date of Birth/Sex: Treating RN: [birth year] (88 y.o. [name] Primary Care Fin Hupp: Danise Edge Other Clinician: Referring Yarethzi Branan: Treating Lamia Mariner/Extender: Rosalio Loud in Treatment: 0 Active Problems Location of Pain Severity and Description of Pain Patient Has Paino No Site Locations Pain Management and Medication Current Pain Management: Electronic Signature(s) Signed: 11/12/2022 4:03:05 PM By: Redmond Pulling RN, BSN Entered By: Redmond Pulling on 11/11/2022 08:06:03 -------------------------------------------------------------------------------- Patient/Caregiver Education Details Patient Name: Date of Service: Mario LO FSO N, GEO RGE L. 5/7/2024andnbsp8:00 A M Medical  Record Number: [id number] Patient Account Number: [id number] Date of Birth/Gender: Treating RN: 01-08-1935 (88 y.o. [name] Primary Care Physician: Danise Edge Other Clinician: Referring Physician: Treating Physician/Extender: Rosalio Loud in Treatment: 0 Education Assessment Education Provided To: Patient Education Topics Provided Welcome T [facility] Packet: o Methods: Explain/Verbal Responses: State content correctly Electronic Signature(s) Signed: 11/12/2022 4:03:05 PM By: Redmond Pulling RN, BSN Entered By: Redmond Pulling on 11/11/2022 08:12:04 Jacqlyn Krauss ([id number]) [id number] Page 5 of 5 -------------------------------------------------------------------------------- Vitals Details Patient Name: Date of Service: [name], GEO RGE L. 11/11/2022 8:00 A M Medical Record Number: [id number] Patient Account Number: [id number] Date of Birth/Sex: Treating RN: 1934-08-18 (88 y.o. [name] Primary Care Lanya Bucks: Danise Edge  Other Clinician: Referring Siomara Burkel: Treating Saul Fabiano/Extender: Rosalio Loud in Treatment: 0 Vital Signs Time Taken: 08:01 Temperature (F): 97.7 Pulse (bpm): 67 Respiratory Rate (breaths/min): 18 Blood Pressure (mmHg): 134/81 Capillary Blood Glucose (mg/dl): 086 Reference Range: 80 - 120 mg / dl Electronic Signature(s) Signed: 11/12/2022 4:03:05 PM By: Redmond Pulling RN, BSN Entered By: Redmond Pulling on 11/11/2022 08:02:00

## 2022-11-12 NOTE — Progress Notes (Signed)
[name] ([id number]) [id number] Page 1 of 4 Visit Report for 11/11/2022 Abuse Risk Screen Details Patient Name: Date of Service: [name], GEO RGE L. 11/11/2022 8:00 A M Medical Record Number: [id number] Patient Account Number: [id number] Date of Birth/Sex: Treating RN: [birth year] (88 y.o. [name] Primary Care [name]: [name] Other Clinician: Referring [name]: Treating [name]/Extender: [name] in Treatment: 0 Abuse Risk Screen Items Answer ABUSE RISK SCREEN: Has anyone close to you tried to hurt or harm you recentlyo No Do you feel uncomfortable with anyone in your familyo No Has anyone forced you do things that you didnt want to doo No Electronic Signature(s) Signed: 11/12/2022 4:03:05 PM By: Redmond Pulling RN, BSN Entered By: Redmond Pulling on 11/11/2022 08:03:49 -------------------------------------------------------------------------------- Activities of Daily Living Details Patient Name: Date of Service: [name], GEO RGE L. 11/11/2022 8:00 A M Medical Record Number: [id number] Patient Account Number: [id number] Date of Birth/Sex: Treating RN: Nov 01, 1934 (88 y.o. [name] Primary Care Raydon Chappuis: [name] Other Clinician: Referring Hoke Baer: Treating Aziya Arena/Extender: [name] in Treatment: 0 Activities of Daily Living Items Answer Activities of Daily Living (Please select one for each item) Drive Automobile Completely Able T Medications ake Completely Able Use T elephone Completely Able Care for Appearance Completely Able Use T oilet Completely Able Bath / Shower Completely Able Dress Self Completely Able Feed Self Completely Able Walk Completely Able Get In / Out Bed Completely Able Housework Completely Able Prepare Meals Completely Able Handle Money Completely Able Shop for Self Completely Able Electronic Signature(s) Signed:  11/12/2022 4:03:05 PM By: Redmond Pulling RN, BSN Entered By: Redmond Pulling on 11/11/2022 08:04:13 -------------------------------------------------------------------------------- Education Screening Details Patient Name: Date of Service: [name], GEO RGE L. 11/11/2022 8:00 A M Medical Record Number: [id number] Patient Account Number: [id number] Date of Birth/Sex: Treating RN: 09/05/1934 (88 y.o. [name] Primary Care Whitfield Dulay: [name] Other Clinician: Referring Graciana Sessa: Treating Shamira Toutant/Extender: [name] in Treatment: 0 Gardner, Mario ([id number]) [id number] Page 2 of 4 Learning Preferences/Education Level/Primary Language Learning Preference: Explanation, Demonstration, Printed Material Preferred Language: English Cognitive Barrier Language Barrier: No Translator Needed: No Memory Deficit: No Emotional Barrier: No Cultural/Religious Beliefs Affecting Medical Care: No Physical Barrier Impaired Vision: No Impaired Hearing: No Decreased Hand dexterity: No Knowledge/Comprehension Knowledge Level: High Comprehension Level: High Ability to understand written instructions: High Ability to understand verbal instructions: High Motivation Anxiety Level: Calm Cooperation: Cooperative Education Importance: Acknowledges Need Interest in Health Problems: Asks Questions Perception: Coherent Willingness to Engage in Self-Management High Activities: Readiness to Engage in Self-Management High Activities: Electronic Signature(s) Signed: 11/12/2022 4:03:05 PM By: Redmond Pulling RN, BSN Entered By: Redmond Pulling on 11/11/2022 08:04:45 -------------------------------------------------------------------------------- Fall Risk Assessment Details Patient Name: Date of Service: [name], GEO RGE L. 11/11/2022 8:00 A M Medical Record Number: [id number] Patient Account Number: [id number] Date of Birth/Sex:  Treating RN: 1935/02/06 (88 y.o. [name] Primary Care Nieshia Larmon: [name] Other Clinician: Referring Kensli Bowley: Treating Emilina Smarr/Extender: [name] in Treatment: 0 Fall Risk Assessment Items Have you had 2 or more falls in the last 12 monthso 0 No Have you had any fall that resulted in injury in the last 12 monthso 0 No FALLS RISK SCREEN History of falling - immediate or within 3 months 0 No Secondary diagnosis (Do you have 2 or more medical diagnoseso) 0 No Ambulatory aid None/bed rest/wheelchair/nurse 0 No  Crutches/cane/walker 0 No Furniture 0 No Intravenous therapy Access/Saline/Heparin Lock 0 No Gait/Transferring Normal/ bed rest/ wheelchair 0 No Weak (short steps with or without shuffle, stooped but able to lift head while walking, may seek 0 No support from furniture) Impaired (short steps with shuffle, may have difficulty arising from chair, head down, impaired 0 No balance) Mental Status Oriented to own ability 0 No Electronic Signature(s) [name], [name] ([id number]) [phone] [id number] Page 3 of 4 Signed: 11/12/2022 4:03:05 PM By: Redmond Pulling RN, BSN Entered By: Redmond Pulling on 11/11/2022 08:04:58 -------------------------------------------------------------------------------- Foot Assessment Details Patient Name: Date of Service: [name], GEO RGE L. 11/11/2022 8:00 A M Medical Record Number: [id number] Patient Account Number: [id number] Date of Birth/Sex: Treating RN: 05-08-1935 (88 y.o. [name] Primary Care Aalyah Mansouri: [name] Other Clinician: Referring Eyal Greenhaw: Treating Brytni Dray/Extender: [name] in Treatment: 0 Foot Assessment Items Site Locations + = Sensation present, - = Sensation absent, C = Callus, U = Ulcer R = Redness, W = Warmth, M = Maceration, PU = Pre-ulcerative lesion F = Fissure, S = Swelling, D = Dryness Assessment Right:  Left: Other Deformity: No No Prior Foot Ulcer: No No Prior Amputation: No No Charcot Joint: No No Ambulatory Status: Gait: Electronic Signature(s) Signed: 11/12/2022 4:03:05 PM By: Redmond Pulling RN, BSN Entered By: Redmond Pulling on 11/11/2022 08:05:18 -------------------------------------------------------------------------------- Nutrition Risk Screening Details Patient Name: Date of Service: [name], GEO RGE L. 11/11/2022 8:00 A M Medical Record Number: [id number] Patient Account Number: [id number] Date of Birth/Sex: Treating RN: 28-Apr-1935 (88 y.o. [name] Primary Care Adaisha Campise: [name] Other Clinician: Referring Adaiah Jaskot: Treating Jenisa Monty/Extender: [name] in Treatment: 0 Height (in): Weight (lbs): Body Mass Index (BMI): [name], [name] ([id number]) [phone] [id number] Page 4 of 4 Nutrition Risk Screening Items Score Screening NUTRITION RISK SCREEN: I have an illness or condition that made me change the kind and/or amount of food I eat 0 No I eat fewer than two meals per day 0 No I eat few fruits and vegetables, or milk products 0 No I have three or more drinks of beer, liquor or wine almost every day 0 No I have tooth or mouth problems that make it hard for me to eat 0 No I don't always have enough money to buy the food I need 0 No I eat alone most of the time 0 No I take three or more different prescribed or over-the-counter drugs a day 0 No Without wanting to, I have lost or gained 10 pounds in the last six months 0 No I am not always physically able to shop, cook and/or feed myself 0 No Nutrition Protocols Good Risk Protocol Moderate Risk Protocol High Risk Proctocol Risk Level: Good Risk Score: 0 Electronic Signature(s) Signed: 11/12/2022 4:03:05 PM By: Redmond Pulling RN, BSN Entered By: Redmond Pulling on 11/11/2022 08:05:13

## 2022-11-13 ENCOUNTER — Other Ambulatory Visit: Payer: Self-pay | Admitting: Family Medicine

## 2022-12-14 ENCOUNTER — Other Ambulatory Visit: Payer: Self-pay | Admitting: Family Medicine

## 2022-12-18 ENCOUNTER — Ambulatory Visit (INDEPENDENT_AMBULATORY_CARE_PROVIDER_SITE_OTHER): Payer: Medicare Other | Admitting: Medical

## 2022-12-18 VITALS — BP 106/85 | HR 55 | Temp 98.0°F | Resp 16 | Wt 168.0 lb

## 2022-12-18 DIAGNOSIS — Z7984 Long term (current) use of oral hypoglycemic drugs: Secondary | ICD-10-CM | POA: Diagnosis not present

## 2022-12-18 DIAGNOSIS — E785 Hyperlipidemia, unspecified: Secondary | ICD-10-CM

## 2022-12-18 DIAGNOSIS — D649 Anemia, unspecified: Secondary | ICD-10-CM | POA: Diagnosis not present

## 2022-12-18 DIAGNOSIS — E119 Type 2 diabetes mellitus without complications: Secondary | ICD-10-CM | POA: Diagnosis not present

## 2022-12-18 LAB — COMPREHENSIVE METABOLIC PANEL
ALT: 12 U/L (ref 0–53)
AST: 13 U/L (ref 0–37)
Albumin: 4 g/dL (ref 3.5–5.2)
Alkaline Phosphatase: 61 U/L (ref 39–117)
BUN: 16 mg/dL (ref 6–23)
CO2: 26 mEq/L (ref 19–32)
Calcium: 8.9 mg/dL (ref 8.4–10.5)
Chloride: 104 mEq/L (ref 96–112)
Creatinine, Ser: 1.34 mg/dL (ref 0.40–1.50)
GFR: 48.17 mL/min — ABNORMAL LOW (ref >60.00)
Glucose, Bld: 209 mg/dL — ABNORMAL HIGH (ref 70–99)
Potassium: 4.7 mEq/L (ref 3.5–5.1)
Sodium: 138 mEq/L (ref 135–145)
Total Bilirubin: 0.4 mg/dL (ref 0.2–1.2)
Total Protein: 6.2 g/dL (ref 6.0–8.3)

## 2022-12-18 LAB — CBC WITH DIFFERENTIAL/PLATELET
Basophils Absolute: 0.1 10*3/uL (ref 0.0–0.1)
Basophils Relative: 0.7 % (ref 0.0–3.0)
Eosinophils Absolute: 0.3 10*3/uL (ref 0.0–0.7)
Eosinophils Relative: 3.4 % (ref 0.0–5.0)
HCT: 41.8 % (ref 39.0–52.0)
Hemoglobin: 13.5 g/dL (ref 13.0–17.0)
Lymphocytes Relative: 14.5 % (ref 12.0–46.0)
Lymphs Abs: 1.1 10*3/uL (ref 0.7–4.0)
MCHC: 32.4 g/dL (ref 30.0–36.0)
MCV: 98.8 fl (ref 78.0–100.0)
Monocytes Absolute: 0.8 10*3/uL (ref 0.1–1.0)
Monocytes Relative: 9.7 % (ref 3.0–12.0)
Neutro Abs: 5.6 10*3/uL (ref 1.4–7.7)
Neutrophils Relative %: 71.7 % (ref 43.0–77.0)
Platelets: 237 10*3/uL (ref 150.0–400.0)
RBC: 4.23 Mil/uL (ref 4.22–5.81)
RDW: 14.2 % (ref 11.5–15.5)
WBC: 7.8 10*3/uL (ref 4.0–10.5)

## 2022-12-18 LAB — LIPID PANEL
Cholesterol: 169 mg/dL (ref 0–200)
HDL: 58.7 mg/dL (ref >39.00)
LDL Cholesterol: 88 mg/dL (ref 0–99)
NonHDL: 110.74
Total CHOL/HDL Ratio: 3
Triglycerides: 115 mg/dL (ref 0.0–149.0)
VLDL: 23 mg/dL (ref 0.0–40.0)

## 2022-12-18 LAB — HEMOGLOBIN A1C: Hgb A1c MFr Bld: 7.5 % — ABNORMAL HIGH (ref 4.6–6.5)

## 2022-12-18 NOTE — Progress Notes (Signed)
Subjective:    Patient ID: [name], male    DOB: Mar 23, 1935, 88 y.o.   MRN: [id number]  HPI Pt has follow up wth pcp next week.  Pt had slow healing rt forearm wound that did finally heal. Pt update me that it healed slowly and only saw  wound care one time. Took about to mid April until it healed.    Pt wants his labs done before he sees pcp.  Diabetes and due for A1c and cmp. On jardiacne, januvia, metformin and glyburide. Fasting morning sugars most of time 12-130.  High cholesterol- on simvastatin.   Review of Systems  Constitutional:  Negative for chills, fatigue and fever.  HENT:  Negative for congestion, drooling and ear discharge.   Respiratory:  Negative for cough, chest tightness, shortness of breath and wheezing.   Cardiovascular:  Negative for chest pain and palpitations.  Gastrointestinal:  Negative for abdominal pain, anal bleeding and blood in stool.  Musculoskeletal:  Negative for back pain, joint swelling and neck pain.  Skin:  Negative for rash.       Healed wound foream.  Neurological:  Negative for dizziness and headaches.  Hematological:  Negative for adenopathy. Does not bruise/bleed easily.  Psychiatric/Behavioral:  Negative for behavioral problems and decreased concentration.     Past Medical History:  Diagnosis Date   Allergy    mild   Arthritis    fingers, hip   BPH (benign prostatic hypertrophy)    Cataract    removed both eyes   Complication of anesthesia    gases nasally causes sneezing    Diabetes mellitus 2000   type 2   Diabetes mellitus type 2 in nonobese (HCC) 06/19/2009   Qualifier: Diagnosis of  By: Nena Jordan     GERD (gastroesophageal reflux disease)    Glaucoma    followed by Dr Melene Muller at [facility]   History of kidney stones    Hyperlipidemia    Left hip pain 03/14/2012   Malignant neoplasm of prostate (HCC) 03/15/2019   Medicare annual wellness visit, subsequent 12/31/2015   Onychomycosis  09/17/2015   Prostate cancer (HCC)    Tubular adenoma of colon 2003   Vasomotor rhinitis 05/02/2013     Social History   Socioeconomic History   Marital status: Married    Spouse name: Mary   Number of children: 0   Years of education: Not on file   Highest education level: Doctorate  Occupational History   Occupation: semi retired    Associate Professor: Aeronautical engineer: RETIRED  Tobacco Use   Smoking status: Never   Smokeless tobacco: Never  Vaping Use   Vaping Use: Never used  Substance and Sexual Activity   Alcohol use: Not Currently   Drug use: No   Sexual activity: Yes    Comment: lives with wife, travels frequently, no major dietary restrictions. exercises regularly with aerobic, stretch and weights  Other Topics Concern   Not on file  Social History Narrative   Not on file   Social Determinants of Health   Financial Resource Strain: Low Risk  (12/17/2022)   Overall Financial Resource Strain (CARDIA)    Difficulty of Paying Living Expenses: Not hard at all  Food Insecurity: No Food Insecurity (12/17/2022)   Hunger Vital Sign    Worried About Running Out of Food in the Last Year: Never true    Ran Out of Food in the Last Year: Never true  Transportation Needs: No  Transportation Needs (12/17/2022)   PRAPARE - Administrator, Civil Service (Medical): No    Lack of Transportation (Non-Medical): No  Physical Activity: Sufficiently Active (12/17/2022)   Exercise Vital Sign    Days of Exercise per Week: 5 days    Minutes of Exercise per Session: 40 min  Stress: No Stress Concern Present (12/17/2022)   [employer] of Occupational Health - Occupational Stress Questionnaire    Feeling of Stress : Not at all  Social Connections: Socially Integrated (12/17/2022)   Social Connection and Isolation Panel [NHANES]    Frequency of Communication with Friends and Family: More than three times a week    Frequency of Social Gatherings with Friends and Family: Once a  week    Attends Religious Services: 1 to 4 times per year    Active Member of Clubs or Organizations: Yes    Attends Engineer, structural: More than 4 times per year    Marital Status: Married  Catering manager Violence: Not on file    Past Surgical History:  Procedure Laterality Date   APPENDECTOMY  1943   CATARACT EXTRACTION, BILATERAL     2019   COLONOSCOPY     CYSTOSCOPY WITH INSERTION OF UROLIFT     CYSTOSCOPY WITH LITHOLAPAXY N/A 10/10/2021   Procedure: CYSTOSCOPY WITH LITHOLAPAXY;  Surgeon: Marcine Matar, MD;  Location: [facility];  Service: Urology;  Laterality: N/A;   EYE SURGERY     laser surgery on left numerous times.    HOLMIUM LASER APPLICATION N/A 10/10/2021   Procedure: HOLMIUM LASER APPLICATION;  Surgeon: Marcine Matar, MD;  Location: [facility];  Service: Urology;  Laterality: N/A;   POLYPECTOMY     TOTAL HIP ARTHROPLASTY Left 11/15/2019   Procedure: TOTAL HIP ARTHROPLASTY ANTERIOR APPROACH;  Surgeon: Durene Romans, MD;  Location: [facility];  Service: Orthopedics;  Laterality: Left;  70 mins   TRABECULECTOMY Left 09/04/2016    Family History  Problem Relation Age of Onset   Obstructive Sleep Apnea Sister    Heart disease Father        MI   Diabetes Father    Arthritis Brother        knees   Heart disease Brother        a fib   Breast cancer Sister    Colon cancer Paternal Uncle 62   Colon polyps Neg Hx    Esophageal cancer Neg Hx    Rectal cancer Neg Hx    Stomach cancer Neg Hx    Prostate cancer Neg Hx     Allergies  Allergen Reactions   Levaquin [Levofloxacin In D5w] Hives    Current Outpatient Medications on File Prior to Visit  Medication Sig Dispense Refill   alfuzosin (UROXATRAL) 10 MG 24 hr tablet Take 10 mg by mouth daily.     brimonidine-timolol (COMBIGAN) 0.2-0.5 % ophthalmic solution Place 1 drop into the right eye 2 (two) times daily.      brinzolamide (AZOPT) 1 % ophthalmic suspension  Place 1 drop into the right eye 3 (three) times daily.     cholecalciferol (VITAMIN D3) 25 MCG (1000 UNIT) tablet Take 1,000 Units by mouth daily.     famotidine (PEPCID) 20 MG tablet Take 1 tablet (20 mg total) by mouth daily. 90 tablet 1   glucose blood (TRUE METRIX BLOOD GLUCOSE TEST) test strip USE TO TEST BLOOD SUGAR ONCE DAILY 100 strip 12   glyBURIDE (DIABETA) 5 MG tablet  TAKE 2 TABLETS TWO TIMES A DAY WITH MEALS 120 tablet 1   ipratropium (ATROVENT) 0.03 % nasal spray USE 2 SPRAYS IN EACH       NOSTRIL AS NEEDED FOR      RHINITIS 30 mL 0   JANUVIA 100 MG tablet TAKE 1 TABLET DAILY 90 tablet 1   JARDIANCE 10 MG TABS tablet TAKE 2 TABLETS DAILY 60 tablet 0   metFORMIN (GLUCOPHAGE) 1000 MG tablet TAKE 1 TABLET TWICE A DAY 180 tablet 1   Multiple Vitamin (MULTIVITAMIN WITH MINERALS) TABS tablet Take 1 tablet by mouth daily.     simvastatin (ZOCOR) 10 MG tablet TAKE 1 TABLET AT BEDTIME 30 tablet 0   travoprost, benzalkonium, (TRAVATAN) 0.004 % ophthalmic solution Place 1 drop into the right eye at bedtime.     valACYclovir (VALTREX) 1000 MG tablet Take 1 tablet (1,000 mg total) by mouth daily. 90 tablet 3   [facility] LANCETS MISC Use as directed once daily to check blood sugar.  DX E11.9 100 each 6   No current facility-administered medications on file prior to visit.    BP 106/85 (BP Location: Left Arm, Patient Position: Sitting, Cuff Size: Small)   Pulse (!) 55   Temp 98 F (36.7 C)   Resp 16   Wt 168 lb (76.2 kg)   SpO2 99%   BMI 22.78 kg/m        Objective:   Physical Exam  General Mental Status- Alert. General Appearance- Not in acute distress.   Skin Healed forearm   Neck Carotid Arteries- Normal color. Moisture- Normal Moisture. No carotid bruits. No JVD.  Chest and Lung Exam Auscultation: Breath Sounds:-Normal.  Cardiovascular Auscultation:Rythm- Regular. Murmurs & Other Heart Sounds:Auscultation of the heart reveals- No  Murmurs.  Abdomen Inspection:-Inspeection Normal. Palpation/Percussion:Note:No mass. Palpation and Percussion of the abdomen reveal- Non Tender, Non Distended + BS, no rebound or guarding.   Neurologic Cranial Nerve exam:- CN III-XII intact(No nystagmus), symmetric smile. Strength:- 5/5 equal and symmetric strength both upper and lower extremities.       Assessment & Plan:   Patient Instructions   Hyperlipidemia, unspecified hyperlipidemia type On simvastatin. Follow labs and defer to pcp on adjustment. - Lipid panel   Diabetes mellitus type 2 in nonobese (HCC) Continue  jardiacne, januvia, metformin and glyburide. Pcp managing. - Hemoglobin A1c - Comp Met (CMET)    Healed rt forearm wound.  Follow up with pcp upcoming.   Esperanza Richters, PA-C

## 2022-12-18 NOTE — Patient Instructions (Addendum)
Hyperlipidemia, unspecified hyperlipidemia type On simvastatin. Follow labs and defer to pcp on adjustment. - Lipid panel   Diabetes mellitus type 2 in nonobese (HCC) Continue  jardiacne, januvia, metformin and glyburide. Pcp managing. - Hemoglobin A1c - Comp Met (CMET)    Healed rt forearm wound.  Follow up with pcp upcoming.

## 2022-12-23 ENCOUNTER — Ambulatory Visit (INDEPENDENT_AMBULATORY_CARE_PROVIDER_SITE_OTHER): Payer: Medicare Other | Admitting: Family Medicine

## 2022-12-23 VITALS — BP 122/62 | HR 63 | Temp 97.8°F | Resp 16 | Ht 67.0 in | Wt 170.0 lb

## 2022-12-23 DIAGNOSIS — E119 Type 2 diabetes mellitus without complications: Secondary | ICD-10-CM | POA: Diagnosis not present

## 2022-12-23 DIAGNOSIS — K5900 Constipation, unspecified: Secondary | ICD-10-CM | POA: Diagnosis not present

## 2022-12-23 DIAGNOSIS — C61 Malignant neoplasm of prostate: Secondary | ICD-10-CM | POA: Diagnosis not present

## 2022-12-23 DIAGNOSIS — E782 Mixed hyperlipidemia: Secondary | ICD-10-CM | POA: Diagnosis not present

## 2022-12-23 DIAGNOSIS — Z7984 Long term (current) use of oral hypoglycemic drugs: Secondary | ICD-10-CM

## 2022-12-23 NOTE — Assessment & Plan Note (Signed)
S/p prostatectomy. Has historically seen Dr Retta Diones and sees his PA later this week. No new concerns

## 2022-12-23 NOTE — Progress Notes (Unsigned)
Subjective:    Patient ID: [name], male    DOB: 1935/03/28, 88 y.o.   MRN: [id number]  No chief complaint on file.   HPI Discussed the use of AI scribe software for clinical note transcription with the patient, who gave verbal consent to proceed.  History of Present Illness   Patient is a 88 year old male in today for follow up on chronic medical concerns. Denies CP/palp/SOB/HA/congestion/fevers/GI or GU c/o. Taking meds as prescribed.          Past Medical History:  Diagnosis Date   Allergy    mild   Arthritis    fingers, hip   BPH (benign prostatic hypertrophy)    Cataract    removed both eyes   Complication of anesthesia    gases nasally causes sneezing    Diabetes mellitus 2000   type 2   Diabetes mellitus type 2 in nonobese (HCC) 06/19/2009   Qualifier: Diagnosis of  By: Nena Jordan     GERD (gastroesophageal reflux disease)    Glaucoma    followed by Dr Melene Muller at [facility]   History of kidney stones    Hyperlipidemia    Left hip pain 03/14/2012   Malignant neoplasm of prostate (HCC) 03/15/2019   Medicare annual wellness visit, subsequent 12/31/2015   Onychomycosis 09/17/2015   Prostate cancer (HCC)    Tubular adenoma of colon 2003   Vasomotor rhinitis 05/02/2013    Past Surgical History:  Procedure Laterality Date   APPENDECTOMY  1943   CATARACT EXTRACTION, BILATERAL     2019   COLONOSCOPY     CYSTOSCOPY WITH INSERTION OF UROLIFT     CYSTOSCOPY WITH LITHOLAPAXY N/A 10/10/2021   Procedure: CYSTOSCOPY WITH LITHOLAPAXY;  Surgeon: Marcine Matar, MD;  Location: [facility];  Service: Urology;  Laterality: N/A;   EYE SURGERY     laser surgery on left numerous times.    HOLMIUM LASER APPLICATION N/A 10/10/2021   Procedure: HOLMIUM LASER APPLICATION;  Surgeon: Marcine Matar, MD;  Location: [facility];  Service: Urology;  Laterality: N/A;   POLYPECTOMY     TOTAL HIP ARTHROPLASTY Left 11/15/2019    Procedure: TOTAL HIP ARTHROPLASTY ANTERIOR APPROACH;  Surgeon: Durene Romans, MD;  Location: [facility];  Service: Orthopedics;  Laterality: Left;  70 mins   TRABECULECTOMY Left 09/04/2016    Family History  Problem Relation Age of Onset   Obstructive Sleep Apnea Sister    Heart disease Father        MI   Diabetes Father    Arthritis Brother        knees   Heart disease Brother        a fib   Breast cancer Sister    Colon cancer Paternal Uncle 55   Colon polyps Neg Hx    Esophageal cancer Neg Hx    Rectal cancer Neg Hx    Stomach cancer Neg Hx    Prostate cancer Neg Hx     Social History   Socioeconomic History   Marital status: Married    Spouse name: Mary   Number of children: 0   Years of education: Not on file   Highest education level: Doctorate  Occupational History   Occupation: semi retired    Associate Professor: [employer]    Employer: RETIRED  Tobacco Use   Smoking status: Never   Smokeless tobacco: Never  Vaping Use   Vaping Use: Never used  Substance and Sexual  Activity   Alcohol use: Not Currently   Drug use: No   Sexual activity: Yes    Comment: lives with wife, travels frequently, no major dietary restrictions. exercises regularly with aerobic, stretch and weights  Other Topics Concern   Not on file  Social History Narrative   Not on file   Social Determinants of Health   Financial Resource Strain: Low Risk  (12/17/2022)   Overall Financial Resource Strain (CARDIA)    Difficulty of Paying Living Expenses: Not hard at all  Food Insecurity: No Food Insecurity (12/17/2022)   Hunger Vital Sign    Worried About Running Out of Food in the Last Year: Never true    Ran Out of Food in the Last Year: Never true  Transportation Needs: No Transportation Needs (12/17/2022)   PRAPARE - Administrator, Civil Service (Medical): No    Lack of Transportation (Non-Medical): No  Physical Activity: Sufficiently Active (12/17/2022)   Exercise Vital Sign    Days of  Exercise per Week: 5 days    Minutes of Exercise per Session: 40 min  Stress: No Stress Concern Present (12/17/2022)   [employer] of Occupational Health - Occupational Stress Questionnaire    Feeling of Stress : Not at all  Social Connections: Socially Integrated (12/17/2022)   Social Connection and Isolation Panel [NHANES]    Frequency of Communication with Friends and Family: More than three times a week    Frequency of Social Gatherings with Friends and Family: Once a week    Attends Religious Services: 1 to 4 times per year    Active Member of [employer] or Organizations: Yes    Attends Engineer, structural: More than 4 times per year    Marital Status: Married  Catering manager Violence: Not on file    Outpatient Medications Prior to Visit  Medication Sig Dispense Refill   alfuzosin (UROXATRAL) 10 MG 24 hr tablet Take 10 mg by mouth daily.     brimonidine-timolol (COMBIGAN) 0.2-0.5 % ophthalmic solution Place 1 drop into the right eye 2 (two) times daily.      brinzolamide (AZOPT) 1 % ophthalmic suspension Place 1 drop into the right eye 3 (three) times daily.     cholecalciferol (VITAMIN D3) 25 MCG (1000 UNIT) tablet Take 1,000 Units by mouth daily.     famotidine (PEPCID) 20 MG tablet Take 1 tablet (20 mg total) by mouth daily. 90 tablet 1   glucose blood (TRUE METRIX BLOOD GLUCOSE TEST) test strip USE TO TEST BLOOD SUGAR ONCE DAILY 100 strip 12   glyBURIDE (DIABETA) 5 MG tablet TAKE 2 TABLETS TWO TIMES A DAY WITH MEALS 120 tablet 1   ipratropium (ATROVENT) 0.03 % nasal spray USE 2 SPRAYS IN EACH       NOSTRIL AS NEEDED FOR      RHINITIS 30 mL 0   JANUVIA 100 MG tablet TAKE 1 TABLET DAILY 90 tablet 1   JARDIANCE 10 MG TABS tablet TAKE 2 TABLETS DAILY 60 tablet 0   metFORMIN (GLUCOPHAGE) 1000 MG tablet TAKE 1 TABLET TWICE A DAY 180 tablet 1   Multiple Vitamin (MULTIVITAMIN WITH MINERALS) TABS tablet Take 1 tablet by mouth daily.     simvastatin (ZOCOR) 10 MG tablet TAKE 1  TABLET AT BEDTIME 30 tablet 0   travoprost, benzalkonium, (TRAVATAN) 0.004 % ophthalmic solution Place 1 drop into the right eye at bedtime.     valACYclovir (VALTREX) 1000 MG tablet Take 1 tablet (1,000 mg  total) by mouth daily. 90 tablet 3   [place] LANCETS MISC Use as directed once daily to check blood sugar.  DX E11.9 100 each 6   No facility-administered medications prior to visit.    Allergies  Allergen Reactions   Levaquin [Levofloxacin In D5w] Hives    Review of Systems  Constitutional:  Negative for fever and malaise/fatigue.  HENT:  Negative for congestion.   Eyes:  Negative for blurred vision.  Respiratory:  Negative for shortness of breath.   Cardiovascular:  Negative for chest pain, palpitations and leg swelling.  Gastrointestinal:  Negative for abdominal pain, blood in stool and nausea.  Genitourinary:  Negative for dysuria and frequency.  Musculoskeletal:  Negative for falls.  Skin:  Negative for rash.  Neurological:  Negative for dizziness, loss of consciousness and headaches.  Endo/Heme/Allergies:  Negative for environmental allergies.  Psychiatric/Behavioral:  Negative for depression. The patient is not nervous/anxious.       Objective:    Physical Exam Vitals reviewed.  Constitutional:      Appearance: Normal appearance. He is not ill-appearing.  HENT:     Head: Normocephalic and atraumatic.     Nose: Nose normal.  Eyes:     Conjunctiva/sclera: Conjunctivae normal.  Cardiovascular:     Rate and Rhythm: Normal rate.     Pulses: Normal pulses.     Heart sounds: Normal heart sounds. No murmur heard. Pulmonary:     Effort: Pulmonary effort is normal.     Breath sounds: Normal breath sounds. No wheezing.  Abdominal:     Palpations: Abdomen is soft. There is no mass.     Tenderness: There is no abdominal tenderness.  Musculoskeletal:     Cervical back: Normal range of motion.     Right lower leg: No edema.     Left lower leg: No edema.  Skin:     General: Skin is warm and dry.  Neurological:     General: No focal deficit present.     Mental Status: He is alert and oriented to person, place, and time.  Psychiatric:        Mood and Affect: Mood normal.   There were no vitals taken for this visit. Wt Readings from Last 3 Encounters:  12/18/22 168 lb (76.2 kg)  09/22/22 172 lb 6.4 oz (78.2 kg)  09/15/22 172 lb 12.8 oz (78.4 kg)    Diabetic Foot Exam - Simple   No data filed    Lab Results  Component Value Date   WBC 7.8 12/18/2022   HGB 13.5 12/18/2022   HCT 41.8 12/18/2022   PLT 237.0 12/18/2022   GLUCOSE 209 (H) 12/18/2022   CHOL 169 12/18/2022   TRIG 115.0 12/18/2022   HDL 58.70 12/18/2022   LDLCALC 88 12/18/2022   ALT 12 12/18/2022   AST 13 12/18/2022   NA 138 12/18/2022   K 4.7 12/18/2022   CL 104 12/18/2022   CREATININE 1.34 12/18/2022   BUN 16 12/18/2022   CO2 26 12/18/2022   TSH 1.85 03/22/2021   PSA 0.19 09/25/2021   HGBA1C 7.5 (H) 12/18/2022   MICROALBUR 0.7 03/25/2021    Lab Results  Component Value Date   TSH 1.85 03/22/2021   Lab Results  Component Value Date   WBC 7.8 12/18/2022   HGB 13.5 12/18/2022   HCT 41.8 12/18/2022   MCV 98.8 12/18/2022   PLT 237.0 12/18/2022   Lab Results  Component Value Date   NA 138 12/18/2022   K  4.7 12/18/2022   CO2 26 12/18/2022   GLUCOSE 209 (H) 12/18/2022   BUN 16 12/18/2022   CREATININE 1.34 12/18/2022   BILITOT 0.4 12/18/2022   ALKPHOS 61 12/18/2022   AST 13 12/18/2022   ALT 12 12/18/2022   PROT 6.2 12/18/2022   ALBUMIN 4.0 12/18/2022   CALCIUM 8.9 12/18/2022   ANIONGAP 9 11/16/2019   GFR 48.17 (L) 12/18/2022   Lab Results  Component Value Date   CHOL 169 12/18/2022   Lab Results  Component Value Date   HDL 58.70 12/18/2022   Lab Results  Component Value Date   LDLCALC 88 12/18/2022   Lab Results  Component Value Date   TRIG 115.0 12/18/2022   Lab Results  Component Value Date   CHOLHDL 3 12/18/2022   Lab Results   Component Value Date   HGBA1C 7.5 (H) 12/18/2022       Assessment & Plan:  Diabetes mellitus type 2 in nonobese [facility]) Assessment & Plan: hgba1c acceptable, minimize simple carbs. Increase exercise as tolerated. Continue current meds    Mixed hyperlipidemia Assessment & Plan: Tolerating statin, encouraged heart healthy diet, avoid trans fats, minimize simple carbs and saturated fats. Increase exercise as tolerated     Assessment and Plan              Danise Edge, MD

## 2022-12-23 NOTE — Assessment & Plan Note (Addendum)
Encouraged increased hydration and fiber in diet. Daily probiotics. If bowels not moving can use MOM 2 tbls po in 4 oz of warm prune juice by mouth every 2-3 days. If no results then repeat in 4 hours with  Dulcolax suppository pr, may repeat again in 4 more hours as needed. Seek care if symptoms worsen. Uses Miralax in coffee and that works well

## 2022-12-23 NOTE — Patient Instructions (Addendum)
Skratch electrolyte beverage powder at [employer]  RSV, Respiratory Syncitial Virus Vaccine, Arexvy at pharmacy   Carbohydrate Counting for Diabetes Mellitus, Adult Carbohydrate counting is a method of keeping track of how many carbohydrates you eat. Eating carbohydrates increases the amount of sugar (glucose) in the blood. Counting how many carbohydrates you eat improves how well you manage your blood glucose. This, in turn, helps you manage your diabetes. Carbohydrates are measured in grams (g) per serving. It is important to know how many carbohydrates (in grams or by serving size) you can have in each meal. This is different for every person. A dietitian can help you make a meal plan and calculate how many carbohydrates you should have at each meal and snack. What foods contain carbohydrates? Carbohydrates are found in the following foods: Grains, such as breads and cereals. Dried beans and soy products. Starchy vegetables, such as potatoes, peas, and corn. Fruit and fruit juices. Milk and yogurt. Sweets and snack foods, such as cake, cookies, candy, chips, and soft drinks. How do I count carbohydrates in foods? There are two ways to count carbohydrates in food. You can read food labels or learn standard serving sizes of foods. You can use either of these methods or a combination of both. Using the Nutrition Facts label The Nutrition Facts list is included on the labels of almost all packaged foods and beverages in the [place]. It includes: The serving size. Information about nutrients in each serving, including the grams of carbohydrate per serving. To use the Nutrition Facts, decide how many servings you will have. Then, multiply the number of servings by the number of carbohydrates per serving. The resulting number is the total grams of carbohydrates that you will be having. Learning the standard serving sizes of foods When you eat carbohydrate foods that are not packaged or  do not include Nutrition Facts on the label, you need to measure the servings in order to count the grams of carbohydrates. Measure the foods that you will eat with a food scale or measuring cup, if needed. Decide how many standard-size servings you will eat. Multiply the number of servings by 15. For foods that contain carbohydrates, one serving equals 15 g of carbohydrates. For example, if you eat 2 cups or 10 oz (300 g) of strawberries, you will have eaten 2 servings and 30 g of carbohydrates (2 servings x 15 g = 30 g). For foods that have more than one food mixed, such as soups and casseroles, you must count the carbohydrates in each food that is included. The following list contains standard serving sizes of common carbohydrate-rich foods. Each of these servings has about 15 g of carbohydrates: 1 slice of bread. 1 six-inch (15 cm) tortilla. ? cup or 2 oz (53 g) cooked rice or pasta.  cup or 3 oz (85 g) cooked or canned, drained and rinsed beans or lentils.  cup or 3 oz (85 g) starchy vegetable, such as peas, corn, or squash.  cup or 4 oz (120 g) hot cereal.  cup or 3 oz (85 g) boiled or mashed potatoes, or  or 3 oz (85 g) of a large baked potato.  cup or 4 fl oz (118 mL) fruit juice. 1 cup or 8 fl oz (237 mL) milk. 1 small or 4 oz (106 g) apple.  or 2 oz (63 g) of a medium banana. 1 cup or 5 oz (150 g) strawberries. 3 cups or 1 oz (28.3 g) popped popcorn. What is  an example of carbohydrate counting? To calculate the grams of carbohydrates in this sample meal, follow the steps shown below. Sample meal 3 oz (85 g) chicken breast. ? cup or 4 oz (106 g) brown rice.  cup or 3 oz (85 g) corn. 1 cup or 8 fl oz (237 mL) milk. 1 cup or 5 oz (150 g) strawberries with sugar-free whipped topping. Carbohydrate calculation Identify the foods that contain carbohydrates: Rice. Corn. Milk. Strawberries. Calculate how many servings you have of each food: 2 servings rice. 1 serving  corn. 1 serving milk. 1 serving strawberries. Multiply each number of servings by 15 g: 2 servings rice x 15 g = 30 g. 1 serving corn x 15 g = 15 g. 1 serving milk x 15 g = 15 g. 1 serving strawberries x 15 g = 15 g. Add together all of the amounts to find the total grams of carbohydrates eaten: 30 g + 15 g + 15 g + 15 g = 75 g of carbohydrates total. What are tips for following this plan? Shopping Develop a meal plan and then make a shopping list. Buy fresh and frozen vegetables, fresh and frozen fruit, dairy, eggs, beans, lentils, and whole grains. Look at food labels. Choose foods that have more fiber and less sugar. Avoid processed foods and foods with added sugars. Meal planning Aim to have the same number of grams of carbohydrates at each meal and for each snack time. Plan to have regular, balanced meals and snacks. Where to find more information American Diabetes Association: diabetes.org Centers for Disease Control and Prevention: TonerPromos.no Academy of Nutrition and Dietetics: eatright.org Association of Diabetes Care & Education Specialists: diabeteseducator.org Summary Carbohydrate counting is a method of keeping track of how many carbohydrates you eat. Eating carbohydrates increases the amount of sugar (glucose) in your blood. Counting how many carbohydrates you eat improves how well you manage your blood glucose. This helps you manage your diabetes. A dietitian can help you make a meal plan and calculate how many carbohydrates you should have at each meal and snack. This information is not intended to replace advice given to you by your health care provider. Make sure you discuss any questions you have with your health care provider. Document Revised: 01/25/2020 Document Reviewed: 01/25/2020 Elsevier Patient Education  2024 [employer].

## 2022-12-23 NOTE — Assessment & Plan Note (Addendum)
hgba1c acceptable, minimize simple carbs. Increase exercise as tolerated. Continue current meds. His fasting sugar was 177 today but he got no activity. His fasting sugar a week ago was 110. Tries to follow a heart healthy.

## 2022-12-23 NOTE — Assessment & Plan Note (Signed)
Tolerating statin, encouraged heart healthy diet, avoid trans fats, minimize simple carbs and saturated fats. Increase exercise as tolerated 

## 2022-12-24 ENCOUNTER — Encounter: Payer: Self-pay | Admitting: Family Medicine

## 2023-01-06 NOTE — Progress Notes (Signed)
[name] Gardner ([id number]) [id number] Page 1 of 6 Visit Report for 11/11/2022 Chief Complaint Document Details Patient Name: Date of Service: [name] Gardner, [name] RGE L. 11/11/2022 8:00 A M Medical Record Number: [id number] Patient Account Number: [id number] Date of Birth/Sex: Treating RN: [birth year] (88 y.o. M) Primary Care Provider: Danise Edge Other Clinician: Referring Provider: Treating Provider/Extender: Rosalio Loud in Treatment: 0 Information Obtained from: Patient Chief Complaint 11/11/2022; patient is here for review of a wound on the right anterior forearm Electronic Signature(s) Signed: 11/11/2022 3:53:26 PM By: Baltazar Najjar MD Entered By: Baltazar Najjar on 11/11/2022 08:15:12 -------------------------------------------------------------------------------- HPI Details Patient Name: Date of Service: [name] Gardner, [name] RGE L. 11/11/2022 8:00 A M Medical Record Number: [id number] Patient Account Number: [id number] Date of Birth/Sex: Treating RN: 07-Oct-1934 (88 y.o. M) Primary Care Provider: Danise Edge Other Clinician: Referring Provider: Treating Provider/Extender: Rosalio Loud in Treatment: 0 History of Present Illness HPI Description: ADMISSION 11/11/2022 Patient is an 88 year old man who suffered an abrasion on a ladder that he was carrying on his right arm dating back to late February of this year. He was given Silvadene. He was followed up by his primary doctor on 3/18 and the wound was still open at that point he was continued on Silvadene. He said within the last 10 days he developed erythema some tenderness in the area he said there was something raised he took some doxycycline that apparently his wife was taking for rosacea and this helped clear it up. He arrives in clinic with essentially no open wound. Past medical history includes osteoarthritis of the hands, onychomycosis, type 2 diabetes,  nephrolithiasis, BPH, gastroesophageal reflux disease and a history of prostate cancer. Electronic Signature(s) Signed: 11/11/2022 3:53:26 PM By: Baltazar Najjar MD Entered By: Baltazar Najjar on 11/11/2022 08:16:34 -------------------------------------------------------------------------------- Physical Exam Details Patient Name: Date of Service: [name] Gardner, [name] RGE L. 11/11/2022 8:00 A Mario, Gardner ([id number]) [id number] Page 2 of 6 Medical Record Number: [id number] Patient Account Number: [id number] Date of Birth/Sex: Treating RN: 12-24-1934 (88 y.o. M) Primary Care Provider: Danise Edge Other Clinician: Referring Provider: Treating Provider/Extender: Rosalio Loud in Treatment: 0 Constitutional Sitting or standing Blood Pressure is within target range for patient.. Pulse regular and within target range for patient.Marland Kitchen Respirations regular, non-labored and within target range.. Temperature is normal and within the target range for the patient.Marland Kitchen Appears in no distress. Notes Wound exam; his radial pulses are easily palpable. Skin generally looks normal in the arm. On the anterior right forearm there is a scar which is well-healed. There is no palpable tenderness no erythema no observable drainage. Electronic Signature(s) Signed: 11/11/2022 3:53:26 PM By: Baltazar Najjar MD Entered By: Baltazar Najjar on 11/11/2022 08:17:31 -------------------------------------------------------------------------------- Physician Orders Details Patient Name: Date of Service: [name] Gardner, [name] RGE L. 11/11/2022 8:00 A M Medical Record Number: [id number] Patient Account Number: [id number] Date of Birth/Sex: Treating RN: 24-Mar-1935 (88 y.o. [name] Primary Care Provider: Danise Edge Other Clinician: Referring Provider: Treating Provider/Extender: Rosalio Loud in Treatment: 0 Verbal / Phone Orders: No Diagnosis  Coding ICD-10 Coding Code Description S50.811S Abrasion of right forearm, sequela L03.113 Cellulitis of right upper limb Discharge From [facility] Services Discharge from Wound Care Center - Moisturize wound area, cover with sunscreen if outside Electronic Signature(s) Signed: 11/11/2022 3:53:26 PM By: Baltazar Najjar MD Signed: 11/12/2022 4:03:05 PM By: Redmond Pulling RN, BSN Entered By: Redmond Pulling on  11/11/2022 08:15:58 -------------------------------------------------------------------------------- Problem List Details Patient Name: Date of Service: [name] Gardner, [name] RGE L. 11/11/2022 8:00 A M Medical Record Number: [id number] Patient Account Number: [id number] Date of Birth/Sex: Treating RN: Sep 10, 1934 (88 y.o. M) Primary Care Provider: Danise Edge Other Clinician: Referring Provider: Treating Provider/Extender: Rosalio Loud in Treatment: 0 Active Problems ICD-10 Encounter Code Description Active Date MDM [name] Gardner ([id number]) [id number] Page 3 of 6 Code Description Active Date MDM Diagnosis S50.811S Abrasion of right forearm, sequela 11/11/2022 No Yes L03.113 Cellulitis of right upper limb 11/11/2022 No Yes Inactive Problems Resolved Problems Electronic Signature(s) Signed: 11/11/2022 3:53:26 PM By: Baltazar Najjar MD Entered By: Baltazar Najjar on 11/11/2022 08:14:34 -------------------------------------------------------------------------------- Progress Note Details Patient Name: Date of Service: [name] Gardner, [name] RGE L. 11/11/2022 8:00 A M Medical Record Number: [id number] Patient Account Number: [id number] Date of Birth/Sex: Treating RN: 1934-08-10 (88 y.o. M) Primary Care Provider: Danise Edge Other Clinician: Referring Provider: Treating Provider/Extender: Rosalio Loud in Treatment: 0 Subjective Chief Complaint Information obtained from Patient 11/11/2022; patient is here for review of a  wound on the right anterior forearm History of Present Illness (HPI) ADMISSION 11/11/2022 Patient is an 88 year old man who suffered an abrasion on a ladder that he was carrying on his right arm dating back to late February of this year. He was given Silvadene. He was followed up by his primary doctor on 3/18 and the wound was still open at that point he was continued on Silvadene. He said within the last 10 days he developed erythema some tenderness in the area he said there was something raised he took some doxycycline that apparently his wife was taking for rosacea and this helped clear it up. He arrives in clinic with essentially no open wound. Past medical history includes osteoarthritis of the hands, onychomycosis, type 2 diabetes, nephrolithiasis, BPH, gastroesophageal reflux disease and a history of prostate cancer. Patient History Allergies Levaquin (Reaction: hives) Family History Diabetes - Father, Heart Disease - Father,Siblings, Lung Disease - Siblings. Social History Never smoker, Marital Status - Married, Alcohol Use - Never, Drug Use - No History, Caffeine Use - Never. Medical History Eyes Patient has history of Cataracts - removed bilaterally, Glaucoma Endocrine Patient has history of Type II Diabetes Musculoskeletal Patient has history of Osteoarthritis Hospitalization/Surgery History - 10/2021 cystoscopy. - 11/2019 left hip arthroplasty. - appendectomy 1943. Medical A Surgical History Notes nd Gastrointestinal tubular adenoma of colon 2003 GERD Genitourinary kidney stones Oncologic [name] Gardner, [name] Gardner ([id number]) [id number] Page 4 of 6 prostate Ca 2023 BPH Objective Constitutional Sitting or standing Blood Pressure is within target range for patient.. Pulse regular and within target range for patient.Marland Kitchen Respirations regular, non-labored and within target range.. Temperature is normal and within the target range for the patient.Marland Kitchen Appears  in no distress. Vitals Time Taken: 8:01 AM, Temperature: 97.7 F, Pulse: 67 bpm, Respiratory Rate: 18 breaths/min, Blood Pressure: 134/81 mmHg, Capillary Blood Glucose: 171 mg/dl. General Notes: Wound exam; his radial pulses are easily palpable. Skin generally looks normal in the arm. On the anterior right forearm there is a scar which is well-healed. There is no palpable tenderness no erythema no observable drainage. Assessment Active Problems ICD-10 Abrasion of right forearm, sequela Cellulitis of right upper limb Plan Discharge From [facility] Services: Discharge from Wound Care Center - Moisturize wound area, cover with sunscreen if outside 1. Interesting history of an abrasion type wound that was actually present for quite a period of time.  The patient states that he felt he had an infection in this in the last week or 2 and took some of his wife's doxycycline and this is cleared everything up. He arrives in our clinic with no open wound and certainly no evidence of infection or a subcutaneous abscess. There is no erythema no tenderness and no drainage. 2. He is traveling to [place] next week and was concerned enough to want to continue his antibiotic. There is little justification for this looking at the wounded area however. 3. The patient has no reason to follow with [place] at this point Electronic Signature(s) Signed: 01/05/2023 2:28:33 PM By: Pearletha Alfred Signed: 01/06/2023 3:36:09 PM By: Baltazar Najjar MD Previous Signature: 11/11/2022 3:53:26 PM Version By: Baltazar Najjar MD Entered By: Pearletha Alfred on 01/05/2023 14:28:33 -------------------------------------------------------------------------------- HxROS Details Patient Name: Date of Service: [name] Gardner, [name] RGE L. 11/11/2022 8:00 A M Medical Record Number: [id number] Patient Account Number: [id number] Date of Birth/Sex: Treating RN: [birth year] (88 y.o. Tammy Sours Primary Care Provider: Danise Edge Other Clinician: Referring  Provider: Treating Provider/Extender: Lawana Chambers Weeks in Treatment: [address] [name] Gardner, [name] Gardner ([id number]) [id number] Page 5 of 6 Medical History: Positive for: Cataracts - removed bilaterally; Glaucoma Gastrointestinal Medical History: Past Medical History Notes: tubular adenoma of colon 2003 GERD Endocrine Medical History: Positive for: Type II Diabetes Genitourinary Medical History: Past Medical History Notes: kidney stones Musculoskeletal Medical History: Positive for: Osteoarthritis Oncologic Medical History: Past Medical History Notes: prostate Ca 2023 BPH HBO Extended History Items Eyes: Eyes: Cataracts Glaucoma Immunizations Pneumococcal Vaccine: Received Pneumococcal Vaccination: No Implantable Devices No devices added Hospitalization / Surgery History Type of Hospitalization/Surgery 10/2021 cystoscopy 11/2019 left hip arthroplasty appendectomy 1943 Family and Social History Diabetes: Yes - Father; Heart Disease: Yes - Father,Siblings; Lung Disease: Yes - Siblings; Never smoker; Marital Status - Married; Alcohol Use: Never; Drug Use: No History; Caffeine Use: Never; Financial Concerns: No; Food, Clothing or Shelter Needs: No; Support System Lacking: No; Transportation Concerns: No Psychologist, prison and probation services) Signed: 11/11/2022 4:32:52 PM By: Shawn Stall RN, BSN Signed: 11/12/2022 4:03:05 PM By: Redmond Pulling RN, BSN Signed: 01/05/2023 2:57:08 PM By: Geralyn Corwin DO Entered By: Redmond Pulling on 11/11/2022 08:03:37 -------------------------------------------------------------------------------- SuperBill Details Patient Name: Date of Service: [name] Gardner, [name] RGE L. 11/11/2022 Medical Record Number: [id number] Patient Account Number: [id number] Date of Birth/Sex: Treating RN: 1935-01-01 (88 y.o. [address], [address] [place] L ([id number]) [phone].pdf Page 6 of 6 Primary Care  Provider: Danise Edge Other Clinician: Referring Provider: Treating Provider/Extender: Rosalio Loud in Treatment: 0 Diagnosis Coding ICD-10 Codes Code Description [phone] Abrasion of right forearm, sequela L03.113 Cellulitis of right upper limb Facility Procedures : CPT4 Code: [id number] Description: 16010 - WOUND CARE VISIT-LEV 2 EST PT Modifier: Quantity: 1 Physician Procedures : CPT4 Code Description Modifier [id number] - WC PHYS LEVEL 2 - NEW PT ICD-10 Diagnosis Description S50.811S Abrasion of right forearm, sequela L03.113 Cellulitis of right upper limb Quantity: 1 Electronic Signature(s) Signed: 11/11/2022 3:53:26 PM By: Baltazar Najjar MD Entered By: Baltazar Najjar on 11/11/2022 08:19:04

## 2023-01-09 ENCOUNTER — Other Ambulatory Visit: Payer: Self-pay | Admitting: Family Medicine

## 2023-01-10 ENCOUNTER — Other Ambulatory Visit: Payer: Self-pay | Admitting: Family Medicine

## 2023-01-16 ENCOUNTER — Encounter: Payer: Self-pay | Admitting: Podiatry

## 2023-01-16 ENCOUNTER — Ambulatory Visit (INDEPENDENT_AMBULATORY_CARE_PROVIDER_SITE_OTHER): Payer: Medicare Other | Admitting: Podiatry

## 2023-01-16 ENCOUNTER — Other Ambulatory Visit: Payer: Self-pay | Admitting: Family Medicine

## 2023-01-16 DIAGNOSIS — E119 Type 2 diabetes mellitus without complications: Secondary | ICD-10-CM | POA: Diagnosis not present

## 2023-01-16 DIAGNOSIS — B351 Tinea unguium: Secondary | ICD-10-CM

## 2023-01-16 NOTE — Progress Notes (Signed)
This patient returns to my office for at risk foot care.  This patient requires this care by a professional since this patient will be at risk due to having diabetes.  This patient is unable to cut nails himself since the patient cannot reach his nails.These nails are painful walking and wearing shoes.  This patient presents for at risk foot care today. ° °General Appearance  Alert, conversant and in no acute stress. ° °Vascular  Dorsalis pedis and posterior tibial  pulses are palpable  bilaterally.  Capillary return is within normal limits  bilaterally. Temperature is within normal limits  bilaterally. ° °Neurologic  Senn-Weinstein monofilament wire test within normal limits  bilaterally. Muscle power within normal limits bilaterally. ° °Nails Thick disfigured discolored nails with subungual debris  from hallux to fifth toes bilaterally. No evidence of bacterial infection or drainage bilaterally. ° °Orthopedic  No limitations of motion  feet .  No crepitus or effusions noted.  No bony pathology or digital deformities noted. ° °Skin  normotropic skin with no porokeratosis noted bilaterally.  No signs of infections or ulcers noted.    ° °Onychomycosis  Pain in right toes  Pain in left toes ° °Consent was obtained for treatment procedures.   Mechanical debridement of nails 1-5  bilaterally performed with a nail nipper.  Filed with dremel without incident.  ° ° °Return office visit   10 weeks                   Told patient to return for periodic foot care and evaluation due to potential at risk complications. ° ° °Zillah Alexie DPM  °

## 2023-03-08 ENCOUNTER — Other Ambulatory Visit: Payer: Self-pay | Admitting: Family Medicine

## 2023-03-27 ENCOUNTER — Encounter: Payer: Self-pay | Admitting: Podiatry

## 2023-03-27 ENCOUNTER — Ambulatory Visit (INDEPENDENT_AMBULATORY_CARE_PROVIDER_SITE_OTHER): Payer: Medicare Other | Admitting: Podiatry

## 2023-03-27 DIAGNOSIS — E119 Type 2 diabetes mellitus without complications: Secondary | ICD-10-CM | POA: Diagnosis not present

## 2023-03-27 DIAGNOSIS — B351 Tinea unguium: Secondary | ICD-10-CM

## 2023-03-27 NOTE — Progress Notes (Signed)
This patient returns to my office for at risk foot care.  This patient requires this care by a professional since this patient will be at risk due to having diabetes.  This patient is unable to cut nails himself since the patient cannot reach his nails.These nails are painful walking and wearing shoes.  This patient presents for at risk foot care today.  General Appearance  Alert, conversant and in no acute stress.  Vascular  Dorsalis pedis and posterior tibial  pulses are weakly  palpable  bilaterally.  Capillary return is within normal limits  bilaterally. Temperature is within normal limits  bilaterally.  Neurologic  Senn-Weinstein monofilament wire test within normal limits  bilaterally. Muscle power within normal limits bilaterally.  Nails Thick disfigured discolored nails with subungual debris  from hallux to fifth toes bilaterally. No evidence of bacterial infection or drainage bilaterally.  Orthopedic  No limitations of motion  feet .  No crepitus or effusions noted.  No bony pathology or digital deformities noted.  Skin  normotropic skin with no porokeratosis noted bilaterally.  No signs of infections or ulcers noted.     Onychomycosis  Pain in right toes  Pain in left toes  Consent was obtained for treatment procedures.   Mechanical debridement of nails 1-5  bilaterally performed with a nail nipper.  Filed with dremel without incident.    Return office visit  10 weeks                    Told patient to return for periodic foot care and evaluation due to potential at risk complications.   Helane Gunther DPM

## 2023-05-08 ENCOUNTER — Other Ambulatory Visit: Payer: Self-pay | Admitting: Family Medicine

## 2023-06-05 ENCOUNTER — Ambulatory Visit: Payer: Medicare Other | Admitting: Podiatry

## 2023-06-08 ENCOUNTER — Encounter: Payer: Self-pay | Admitting: Podiatry

## 2023-06-08 ENCOUNTER — Other Ambulatory Visit: Payer: Self-pay | Admitting: Family Medicine

## 2023-06-08 ENCOUNTER — Ambulatory Visit (INDEPENDENT_AMBULATORY_CARE_PROVIDER_SITE_OTHER): Payer: Medicare Other | Admitting: Podiatry

## 2023-06-08 DIAGNOSIS — B351 Tinea unguium: Secondary | ICD-10-CM

## 2023-06-08 DIAGNOSIS — E119 Type 2 diabetes mellitus without complications: Secondary | ICD-10-CM | POA: Diagnosis not present

## 2023-06-08 NOTE — Progress Notes (Signed)
This patient returns to my office for at risk foot care.  This patient requires this care by a professional since this patient will be at risk due to having diabetes.  This patient is unable to cut nails himself since the patient cannot reach his nails.These nails are painful walking and wearing shoes.  This patient presents for at risk foot care today.  General Appearance  Alert, conversant and in no acute stress.  Vascular  Dorsalis pedis and posterior tibial  pulses are weakly  palpable  bilaterally.  Capillary return is within normal limits  bilaterally. Temperature is within normal limits  bilaterally.  Neurologic  Senn-Weinstein monofilament wire test within normal limits  bilaterally. Muscle power within normal limits bilaterally.  Nails Thick disfigured discolored nails with subungual debris  from hallux to fifth toes bilaterally. No evidence of bacterial infection or drainage bilaterally.  Orthopedic  No limitations of motion  feet .  No crepitus or effusions noted.  No bony pathology or digital deformities noted.  Skin  normotropic skin with no porokeratosis noted bilaterally.  No signs of infections or ulcers noted.     Onychomycosis  Pain in right toes  Pain in left toes  Consent was obtained for treatment procedures.   Mechanical debridement of nails 1-5  bilaterally performed with a nail nipper.  Filed with dremel without incident.    Return office visit  10 weeks                    Told patient to return for periodic foot care and evaluation due to potential at risk complications.   Helane Gunther DPM

## 2023-06-22 ENCOUNTER — Other Ambulatory Visit (INDEPENDENT_AMBULATORY_CARE_PROVIDER_SITE_OTHER): Payer: Medicare Other

## 2023-06-22 DIAGNOSIS — K5900 Constipation, unspecified: Secondary | ICD-10-CM | POA: Diagnosis not present

## 2023-06-22 DIAGNOSIS — E119 Type 2 diabetes mellitus without complications: Secondary | ICD-10-CM

## 2023-06-22 DIAGNOSIS — E782 Mixed hyperlipidemia: Secondary | ICD-10-CM | POA: Diagnosis not present

## 2023-06-22 LAB — MICROALBUMIN / CREATININE URINE RATIO
Creatinine,U: 79.8 mg/dL
Microalb Creat Ratio: 0.9 mg/g (ref 0.0–30.0)
Microalb, Ur: <0.7 mg/dL (ref 0.0–1.9)

## 2023-06-22 LAB — LIPID PANEL
Cholesterol: 176 mg/dL (ref 0–200)
HDL: 65.9 mg/dL (ref >39.00)
LDL Cholesterol: 90 mg/dL (ref 0–99)
NonHDL: 109.68
Total CHOL/HDL Ratio: 3
Triglycerides: 97 mg/dL (ref 0.0–149.0)
VLDL: 19.4 mg/dL (ref 0.0–40.0)

## 2023-06-22 LAB — CBC WITH DIFFERENTIAL/PLATELET
Basophils Absolute: 0 10*3/uL (ref 0.0–0.1)
Basophils Relative: 0.6 % (ref 0.0–3.0)
Eosinophils Absolute: 0.3 10*3/uL (ref 0.0–0.7)
Eosinophils Relative: 4.2 % (ref 0.0–5.0)
HCT: 42.7 % (ref 39.0–52.0)
Hemoglobin: 14.2 g/dL (ref 13.0–17.0)
Lymphocytes Relative: 16.1 % (ref 12.0–46.0)
Lymphs Abs: 1 10*3/uL (ref 0.7–4.0)
MCHC: 33.2 g/dL (ref 30.0–36.0)
MCV: 97.2 fL (ref 78.0–100.0)
Monocytes Absolute: 0.8 10*3/uL (ref 0.1–1.0)
Monocytes Relative: 12.8 % — ABNORMAL HIGH (ref 3.0–12.0)
Neutro Abs: 4.3 10*3/uL (ref 1.4–7.7)
Neutrophils Relative %: 66.3 % (ref 43.0–77.0)
Platelets: 206 10*3/uL (ref 150.0–400.0)
RBC: 4.39 Mil/uL (ref 4.22–5.81)
RDW: 14.8 % (ref 11.5–15.5)
WBC: 6.5 10*3/uL (ref 4.0–10.5)

## 2023-06-22 LAB — COMPREHENSIVE METABOLIC PANEL
ALT: 13 U/L (ref 0–53)
AST: 14 U/L (ref 0–37)
Albumin: 4.1 g/dL (ref 3.5–5.2)
Alkaline Phosphatase: 64 U/L (ref 39–117)
BUN: 18 mg/dL (ref 6–23)
CO2: 26 meq/L (ref 19–32)
Calcium: 9.1 mg/dL (ref 8.4–10.5)
Chloride: 104 meq/L (ref 96–112)
Creatinine, Ser: 1.38 mg/dL (ref 0.40–1.50)
GFR: 46.33 mL/min — ABNORMAL LOW (ref >60.00)
Glucose, Bld: 173 mg/dL — ABNORMAL HIGH (ref 70–99)
Potassium: 4.5 meq/L (ref 3.5–5.1)
Sodium: 139 meq/L (ref 135–145)
Total Bilirubin: 0.6 mg/dL (ref 0.2–1.2)
Total Protein: 6.2 g/dL (ref 6.0–8.3)

## 2023-06-22 LAB — HEMOGLOBIN A1C: Hgb A1c MFr Bld: 7.9 % — ABNORMAL HIGH (ref 4.6–6.5)

## 2023-06-22 LAB — TSH: TSH: 1.94 u[IU]/mL (ref 0.35–5.50)

## 2023-06-23 ENCOUNTER — Other Ambulatory Visit: Payer: Self-pay | Admitting: Family Medicine

## 2023-06-25 ENCOUNTER — Ambulatory Visit (INDEPENDENT_AMBULATORY_CARE_PROVIDER_SITE_OTHER): Payer: Medicare Other | Admitting: Medical

## 2023-06-25 VITALS — BP 116/80 | HR 70 | Temp 97.6°F | Resp 18 | Ht 67.0 in | Wt 171.0 lb

## 2023-06-25 DIAGNOSIS — R0981 Nasal congestion: Secondary | ICD-10-CM | POA: Diagnosis not present

## 2023-06-25 DIAGNOSIS — R0989 Other specified symptoms and signs involving the circulatory and respiratory systems: Secondary | ICD-10-CM | POA: Diagnosis not present

## 2023-06-25 DIAGNOSIS — J3489 Other specified disorders of nose and nasal sinuses: Secondary | ICD-10-CM

## 2023-06-25 MED ORDER — HYDROCODONE BIT-HOMATROP MBR 5-1.5 MG/5ML PO SOLN: 5.0000 mL | Freq: Four times a day (QID) | ORAL | 0 refills | Status: DC | PRN

## 2023-06-25 MED ORDER — AZITHROMYCIN 250 MG PO TABS: ORAL_TABLET | ORAL | 0 refills | Status: AC

## 2023-06-25 NOTE — Patient Instructions (Addendum)
Upper Respiratory Infection with secondary early Sinusitis Persistent postnasal drip, cough, and yellow mucus. Some relief with Mucinex D and Atrovent nasal spray, but symptoms persist. No fever, but occasional chills. -Start Azithromycin (Zmax) 5-day course. -Continue Atrovent nasal spray as needed. -Add on hycodan for cough  Cough(lungs clear and no wheezing) Significant coughing, especially at night, likely secondary to postnasal drip. -Prescribe Hycodan cough syrup. Cautioned about potential drowsiness and advised against driving while using this medication.  Follow-up appointment scheduled for Monday, June 29, 2023.

## 2023-06-25 NOTE — Progress Notes (Signed)
Subjective:    Patient ID: [name], male    DOB: August 12, 1934, 88 y.o.   MRN: [id number]  HPI Discussed the use of AI scribe software for clinical note transcription with the patient, who gave verbal consent to proceed.  History of Present Illness   The patient initially presented six days ago with postnasal drip causing cough and mucus production. Despite attempts to alleviate symptoms with Mucinex D, Atrovent nasal spray, and Zycam tabs, relief was only temporary, lasting a few hours before symptoms returned. The patient noted that symptoms were particularly bothersome at night, causing coughing due to drainage and interrupting sleep. There were also signs of yellow mucus, indicating possible infection. The patient reported occasional chills, particularly when the effects of Atrovent wore off.  The patient denied any chest congestion, but noted that the drainage occasionally triggered coughing and production of heavy mucus. The patient reported experiencing chills a couple of nights ago. Despite discontinuing Mucinex D and Atrovent due to his limited effectiveness, the patient continued to experience persistent drainage and mucus production.  The patient had previously been prescribed azithromycin (Zmax) and Hycodan cough medication, which had been effective in managing symptoms. The patient reported using Atrovent nasal spray intermittently, only as needed, and often going days without using it. The patient was aware of the potential drowsiness caused by Hycodan and typically used it at bedtime to avoid side effects.        Review of Systems  Constitutional:  Positive for chills.  HENT:  Positive for congestion, sinus pressure and sinus pain.   Respiratory:  Positive for cough. Negative for shortness of breath and wheezing.   Cardiovascular:  Negative for chest pain and palpitations.  Gastrointestinal:  Negative for abdominal pain.  Musculoskeletal:  Negative for back pain and  myalgias.  Neurological:  Negative for dizziness, weakness and headaches.  Hematological:  Negative for adenopathy. Does not bruise/bleed easily.  Psychiatric/Behavioral:  Negative for behavioral problems and confusion. The patient is not hyperactive.     Past Medical History:  Diagnosis Date   Allergy    mild   Arthritis    fingers, hip   BPH (benign prostatic hypertrophy)    Cataract    removed both eyes   Complication of anesthesia    gases nasally causes sneezing    Diabetes mellitus 2000   type 2   Diabetes mellitus type 2 in nonobese (HCC) 06/19/2009   Qualifier: Diagnosis of  By: Nena Jordan     GERD (gastroesophageal reflux disease)    Glaucoma    followed by Dr Melene Muller at [facility]   History of kidney stones    Hyperlipidemia    Left hip pain 03/14/2012   Malignant neoplasm of prostate (HCC) 03/15/2019   Medicare annual wellness visit, subsequent 12/31/2015   Onychomycosis 09/17/2015   Prostate cancer (HCC)    Tubular adenoma of colon 2003   Vasomotor rhinitis 05/02/2013     Social History   Socioeconomic History   Marital status: Married    Spouse name: Mary   Number of children: 0   Years of education: Not on file   Highest education level: Doctorate  Occupational History   Occupation: semi retired    Associate Professor: [employer]    Employer: RETIRED  Tobacco Use   Smoking status: Never   Smokeless tobacco: Never  Vaping Use   Vaping status: Never Used  Substance and Sexual Activity   Alcohol use: Not Currently   Drug use: No  Sexual activity: Yes    Comment: lives with wife, travels frequently, no major dietary restrictions. exercises regularly with aerobic, stretch and weights  Other Topics Concern   Not on file  Social History Narrative   Not on file   Social Drivers of Health   Financial Resource Strain: Low Risk  (06/24/2023)   Overall Financial Resource Strain (CARDIA)    Difficulty of Paying Living Expenses: Not hard at all  Food  Insecurity: No Food Insecurity (12/17/2022)   Hunger Vital Sign    Worried About Running Out of Food in the Last Year: Never true    Ran Out of Food in the Last Year: Never true  Transportation Needs: No Transportation Needs (06/24/2023)   PRAPARE - Administrator, Civil Service (Medical): No    Lack of Transportation (Non-Medical): No  Physical Activity: Sufficiently Active (06/24/2023)   Exercise Vital Sign    Days of Exercise per Week: 6 days    Minutes of Exercise per Session: 40 min  Stress: No Stress Concern Present (12/17/2022)   [employer] of Occupational Health - Occupational Stress Questionnaire    Feeling of Stress : Not at all  Social Connections: Socially Integrated (06/24/2023)   Social Connection and Isolation Panel [NHANES]    Frequency of Communication with Friends and Family: More than three times a week    Frequency of Social Gatherings with Friends and Family: Twice a week    Attends Religious Services: 1 to 4 times per year    Active Member of Clubs or Organizations: Yes    Attends Engineer, structural: More than 4 times per year    Marital Status: Married  Catering manager Violence: Not on file    Past Surgical History:  Procedure Laterality Date   APPENDECTOMY  1943   CATARACT EXTRACTION, BILATERAL     2019   COLONOSCOPY     CYSTOSCOPY WITH INSERTION OF UROLIFT     CYSTOSCOPY WITH LITHOLAPAXY N/A 10/10/2021   Procedure: CYSTOSCOPY WITH LITHOLAPAXY;  Surgeon: Marcine Matar, MD;  Location: [facility];  Service: Urology;  Laterality: N/A;   EYE SURGERY     laser surgery on left numerous times.    HOLMIUM LASER APPLICATION N/A 10/10/2021   Procedure: HOLMIUM LASER APPLICATION;  Surgeon: Marcine Matar, MD;  Location: [facility];  Service: Urology;  Laterality: N/A;   POLYPECTOMY     TOTAL HIP ARTHROPLASTY Left 11/15/2019   Procedure: TOTAL HIP ARTHROPLASTY ANTERIOR APPROACH;  Surgeon: Durene Romans, MD;  Location: [facility];  Service: Orthopedics;  Laterality: Left;  70 mins   TRABECULECTOMY Left 09/04/2016    Family History  Problem Relation Age of Onset   Obstructive Sleep Apnea Sister    Heart disease Father        MI   Diabetes Father    Arthritis Brother        knees   Heart disease Brother        a fib   Breast cancer Sister    Colon cancer Paternal Uncle 41   Colon polyps Neg Hx    Esophageal cancer Neg Hx    Rectal cancer Neg Hx    Stomach cancer Neg Hx    Prostate cancer Neg Hx     Allergies  Allergen Reactions   Levaquin [Levofloxacin In D5w] Hives    Current Outpatient Medications on File Prior to Visit  Medication Sig Dispense Refill   alfuzosin (UROXATRAL) 10 MG  24 hr tablet Take 10 mg by mouth daily.     brimonidine-timolol (COMBIGAN) 0.2-0.5 % ophthalmic solution Place 1 drop into the right eye 2 (two) times daily.      brinzolamide (AZOPT) 1 % ophthalmic suspension Place 1 drop into the right eye 3 (three) times daily.     cholecalciferol (VITAMIN D3) 25 MCG (1000 UNIT) tablet Take 1,000 Units by mouth daily.     famotidine (PEPCID) 20 MG tablet Take 1 tablet (20 mg total) by mouth daily. 90 tablet 1   glyBURIDE (DIABETA) 5 MG tablet Take 2 tablets (10 mg total) by mouth 2 (two) times daily with a meal. 360 tablet 0   ipratropium (ATROVENT) 0.03 % nasal spray USE 2 SPRAYS IN EACH       NOSTRIL AS NEEDED FOR      RHINITIS 30 mL 0   JANUVIA 100 MG tablet TAKE 1 TABLET DAILY 90 tablet 1   JARDIANCE 10 MG TABS tablet TAKE 2 TABLETS DAILY 180 tablet 1   metFORMIN (GLUCOPHAGE) 1000 MG tablet TAKE 1 TABLET TWICE A DAY 180 tablet 1   Multiple Vitamin (MULTIVITAMIN WITH MINERALS) TABS tablet Take 1 tablet by mouth daily.     simvastatin (ZOCOR) 10 MG tablet TAKE 1 TABLET AT BEDTIME 90 tablet 1   travoprost, benzalkonium, (TRAVATAN) 0.004 % ophthalmic solution Place 1 drop into the right eye at bedtime.     TRUE METRIX BLOOD GLUCOSE TEST test strip USE TO  TEST BLOOD SUGAR ONCE DAILY. 100 strip 12   valACYclovir (VALTREX) 1000 MG tablet Take 1 tablet (1,000 mg total) by mouth daily. 90 tablet 3   [facility] LANCETS MISC Use as directed once daily to check blood sugar.  DX E11.9 100 each 6   No current facility-administered medications on file prior to visit.    BP 116/80   Pulse 70   Temp 97.6 F (36.4 C)   Resp 18   Ht 5\' 7"  (1.702 m)   Wt 171 lb (77.6 kg)   SpO2 98%   BMI 26.78 kg/m        Objective:   Physical Exam  General- No acute distress. Pleasant patient. Neck- Full range of motion, no jvd Lungs- Clear, even and unlabored. Heart- regular rate and rhythm. Neurologic- CNII- XII grossly intact.  Heent- nasal congestin. +pnd. Frontal sinus pressure.      Assessment & Plan:   Patient Instructions  Upper Respiratory Infection with secondary early Sinusitis Persistent postnasal drip, cough, and yellow mucus. Some relief with Mucinex D and Atrovent nasal spray, but symptoms persist. No fever, but occasional chills. -Start Azithromycin (Zmax) 5-day course. -Continue Atrovent nasal spray as needed. -Add on hycodan for cough  Cough(lungs clear and no wheezing) Significant coughing, especially at night, likely secondary to postnasal drip. -Prescribe Hycodan cough syrup. Cautioned about potential drowsiness and advised against driving while using this medication.  Follow-up appointment scheduled for Monday, June 29, 2023.   Esperanza Richters, PA-C

## 2023-06-29 ENCOUNTER — Ambulatory Visit (INDEPENDENT_AMBULATORY_CARE_PROVIDER_SITE_OTHER): Payer: Medicare Other | Admitting: Medical

## 2023-06-29 ENCOUNTER — Ambulatory Visit: Payer: Medicare Other | Admitting: Family Medicine

## 2023-06-29 VITALS — BP 135/60 | HR 56 | Temp 97.6°F | Resp 18 | Ht 67.0 in | Wt 171.0 lb

## 2023-06-29 DIAGNOSIS — Z8262 Family history of osteoporosis: Secondary | ICD-10-CM | POA: Diagnosis not present

## 2023-06-29 DIAGNOSIS — E119 Type 2 diabetes mellitus without complications: Secondary | ICD-10-CM | POA: Diagnosis not present

## 2023-06-29 DIAGNOSIS — Z7984 Long term (current) use of oral hypoglycemic drugs: Secondary | ICD-10-CM

## 2023-06-29 DIAGNOSIS — E785 Hyperlipidemia, unspecified: Secondary | ICD-10-CM

## 2023-06-29 DIAGNOSIS — E782 Mixed hyperlipidemia: Secondary | ICD-10-CM | POA: Diagnosis not present

## 2023-06-29 DIAGNOSIS — J3489 Other specified disorders of nose and nasal sinuses: Secondary | ICD-10-CM

## 2023-06-29 NOTE — Addendum Note (Signed)
Addended by: Gwenevere Abbot on: 06/29/2023 11:18 AM   Modules accepted: Orders

## 2023-06-29 NOTE — Progress Notes (Signed)
Subjective:    Patient ID: [name], male    DOB: [birth year], 88 y.o.   MRN: [id number]  HPI  Discussed the use of AI scribe software for clinical note transcription with the patient, who gave verbal consent to proceed.  History of Present Illness   The patient, with a family history of osteoporosis involving three sisters and a brother, expressed concern about his own bone density. He has not previously discussed this with a healthcare provider and has not had any bone density tests. He denies any history of steroid use or unexplained fractures. No vit d deficiency on lab review.  The patient also reported a recent course of azithromycin for a sinus infection, which he believes has improved his symptoms. However, he still experiences thick mucus, particularly in the mornings. He has been using Mucinex D and Atrovent nasal spray as needed, but is interested in alternatives to manage his mucus production. He has a history of sinus issues, but does not use nasal saline rinses as in past has not helped.  In terms of his diabetes management, the patient has been monitoring his blood sugar levels daily for the past 20 years. He is currently on a regimen of Januvia, Jardiance, and Metformin. His most recent A1c was in the high sevens, which he understands is acceptable for his age. He also takes Simvastatin for cholesterol management, which has been effective.  The patient has had one shingles vaccine several years ago, but has not received the newer Shingrix vaccine. He takes a daily multivitamin and additional Vitamin D3.        Past Medical History:  Diagnosis Date   Allergy    mild   Arthritis    fingers, hip   BPH (benign prostatic hypertrophy)    Cataract    removed both eyes   Complication of anesthesia    gases nasally causes sneezing    Diabetes mellitus 2000   type 2   Diabetes mellitus type 2 in nonobese (HCC) 06/19/2009   Qualifier: Diagnosis of  By: Nena Jordan     GERD (gastroesophageal reflux disease)    Glaucoma    followed by Dr Melene Muller at [facility]   History of kidney stones    Hyperlipidemia    Left hip pain 03/14/2012   Malignant neoplasm of prostate (HCC) 03/15/2019   Medicare annual wellness visit, subsequent 12/31/2015   Onychomycosis 09/17/2015   Prostate cancer (HCC)    Tubular adenoma of colon 2003   Vasomotor rhinitis 05/02/2013     Social History   Socioeconomic History   Marital status: Married    Spouse name: Mary   Number of children: 0   Years of education: Not on file   Highest education level: Doctorate  Occupational History   Occupation: semi retired    Associate Professor: Aeronautical engineer: RETIRED  Tobacco Use   Smoking status: Never   Smokeless tobacco: Never  Vaping Use   Vaping status: Never Used  Substance and Sexual Activity   Alcohol use: Not Currently   Drug use: No   Sexual activity: Yes    Comment: lives with wife, travels frequently, no major dietary restrictions. exercises regularly with aerobic, stretch and weights  Other Topics Concern   Not on file  Social History Narrative   Not on file   Social Drivers of Health   Financial Resource Strain: Low Risk  (06/24/2023)   Overall Financial Resource Strain (CARDIA)    Difficulty  of Paying Living Expenses: Not hard at all  Food Insecurity: No Food Insecurity (12/17/2022)   Hunger Vital Sign    Worried About Running Out of Food in the Last Year: Never true    Ran Out of Food in the Last Year: Never true  Transportation Needs: No Transportation Needs (06/24/2023)   PRAPARE - Administrator, Civil Service (Medical): No    Lack of Transportation (Non-Medical): No  Physical Activity: Sufficiently Active (06/24/2023)   Exercise Vital Sign    Days of Exercise per Week: 6 days    Minutes of Exercise per Session: 40 min  Stress: No Stress Concern Present (12/17/2022)   [employer] of Occupational Health - Occupational  Stress Questionnaire    Feeling of Stress : Not at all  Social Connections: Socially Integrated (06/24/2023)   Social Connection and Isolation Panel [NHANES]    Frequency of Communication with Friends and Family: More than three times a week    Frequency of Social Gatherings with Friends and Family: Twice a week    Attends Religious Services: 1 to 4 times per year    Active Member of Clubs or Organizations: Yes    Attends Engineer, structural: More than 4 times per year    Marital Status: Married  Catering manager Violence: Not on file    Past Surgical History:  Procedure Laterality Date   APPENDECTOMY  1943   CATARACT EXTRACTION, BILATERAL     2019   COLONOSCOPY     CYSTOSCOPY WITH INSERTION OF UROLIFT     CYSTOSCOPY WITH LITHOLAPAXY N/A 10/10/2021   Procedure: CYSTOSCOPY WITH LITHOLAPAXY;  Surgeon: Marcine Matar, MD;  Location: [facility] [place];  Service: Urology;  Laterality: N/A;   EYE SURGERY     laser surgery on left numerous times.    HOLMIUM LASER APPLICATION N/A 10/10/2021   Procedure: HOLMIUM LASER APPLICATION;  Surgeon: Marcine Matar, MD;  Location: [facility];  Service: Urology;  Laterality: N/A;   POLYPECTOMY     TOTAL HIP ARTHROPLASTY Left 11/15/2019   Procedure: TOTAL HIP ARTHROPLASTY ANTERIOR APPROACH;  Surgeon: Durene Romans, MD;  Location: [facility];  Service: Orthopedics;  Laterality: Left;  70 mins   TRABECULECTOMY Left 09/04/2016    Family History  Problem Relation Age of Onset   Obstructive Sleep Apnea Sister    Heart disease Father        MI   Diabetes Father    Arthritis Brother        knees   Heart disease Brother        a fib   Breast cancer Sister    Colon cancer Paternal Uncle 54   Colon polyps Neg Hx    Esophageal cancer Neg Hx    Rectal cancer Neg Hx    Stomach cancer Neg Hx    Prostate cancer Neg Hx     Allergies  Allergen Reactions   Levaquin [Levofloxacin In D5w] Hives    Current Outpatient  Medications on File Prior to Visit  Medication Sig Dispense Refill   alfuzosin (UROXATRAL) 10 MG 24 hr tablet Take 10 mg by mouth daily.     azithromycin (ZITHROMAX) 250 MG tablet Take 2 tablets on day 1, then 1 tablet daily on days 2 through 5 6 tablet 0   brimonidine-timolol (COMBIGAN) 0.2-0.5 % ophthalmic solution Place 1 drop into the right eye 2 (two) times daily.      brinzolamide (AZOPT) 1 % ophthalmic suspension Place  1 drop into the right eye 3 (three) times daily.     cholecalciferol (VITAMIN D3) 25 MCG (1000 UNIT) tablet Take 1,000 Units by mouth daily.     famotidine (PEPCID) 20 MG tablet Take 1 tablet (20 mg total) by mouth daily. 90 tablet 1   glyBURIDE (DIABETA) 5 MG tablet Take 2 tablets (10 mg total) by mouth 2 (two) times daily with a meal. 360 tablet 0   HYDROcodone bit-homatropine (HYCODAN) 5-1.5 MG/5ML syrup Take 5 mLs by mouth every 6 (six) hours as needed for cough. 120 mL 0   ipratropium (ATROVENT) 0.03 % nasal spray USE 2 SPRAYS IN EACH       NOSTRIL AS NEEDED FOR      RHINITIS 30 mL 0   JANUVIA 100 MG tablet TAKE 1 TABLET DAILY 90 tablet 1   JARDIANCE 10 MG TABS tablet TAKE 2 TABLETS DAILY 180 tablet 1   metFORMIN (GLUCOPHAGE) 1000 MG tablet TAKE 1 TABLET TWICE A DAY 180 tablet 1   Multiple Vitamin (MULTIVITAMIN WITH MINERALS) TABS tablet Take 1 tablet by mouth daily.     simvastatin (ZOCOR) 10 MG tablet TAKE 1 TABLET AT BEDTIME 90 tablet 1   travoprost, benzalkonium, (TRAVATAN) 0.004 % ophthalmic solution Place 1 drop into the right eye at bedtime.     TRUE METRIX BLOOD GLUCOSE TEST test strip USE TO TEST BLOOD SUGAR ONCE DAILY. 100 strip 12   valACYclovir (VALTREX) 1000 MG tablet Take 1 tablet (1,000 mg total) by mouth daily. 90 tablet 3   [employer] LANCETS MISC Use as directed once daily to check blood sugar.  DX E11.9 100 each 6   No current facility-administered medications on file prior to visit.    BP 135/60   Pulse (!) 56   Temp 97.6 F (36.4 C)   Resp 18    Ht 5\' 7"  (1.702 m)   Wt 171 lb (77.6 kg)   SpO2 99%   BMI 26.78 kg/m       Review of Systems  Constitutional:  Negative for chills, fatigue and fever.  HENT:  Positive for congestion. Negative for drooling, ear pain, sinus pain, sneezing, sore throat and voice change.   Respiratory:  Negative for cough, chest tightness and wheezing.   Cardiovascular:  Negative for chest pain and palpitations.  Gastrointestinal:  Negative for abdominal pain, constipation and nausea.  Genitourinary:  Negative for enuresis.  Musculoskeletal:  Negative for back pain and myalgias.  Skin:  Negative for rash.  Neurological:  Negative for dizziness, weakness and light-headedness.  Hematological:  Negative for adenopathy. Does not bruise/bleed easily.  Psychiatric/Behavioral:  Negative for behavioral problems and decreased concentration.     Past Medical History:  Diagnosis Date   Allergy    mild   Arthritis    fingers, hip   BPH (benign prostatic hypertrophy)    Cataract    removed both eyes   Complication of anesthesia    gases nasally causes sneezing    Diabetes mellitus 2000   type 2   Diabetes mellitus type 2 in nonobese (HCC) 06/19/2009   Qualifier: Diagnosis of  By: Nena Jordan     GERD (gastroesophageal reflux disease)    Glaucoma    followed by Dr Melene Muller at [facility]   History of kidney stones    Hyperlipidemia    Left hip pain 03/14/2012   Malignant neoplasm of prostate (HCC) 03/15/2019   Medicare annual wellness visit, subsequent 12/31/2015   Onychomycosis 09/17/2015  Prostate cancer (HCC)    Tubular adenoma of colon 2003   Vasomotor rhinitis 05/02/2013     Social History   Socioeconomic History   Marital status: Married    Spouse name: Mary   Number of children: 0   Years of education: Not on file   Highest education level: Doctorate  Occupational History   Occupation: semi retired    Associate Professor: Aeronautical engineer: RETIRED  Tobacco Use   Smoking  status: Never   Smokeless tobacco: Never  Vaping Use   Vaping status: Never Used  Substance and Sexual Activity   Alcohol use: Not Currently   Drug use: No   Sexual activity: Yes    Comment: lives with wife, travels frequently, no major dietary restrictions. exercises regularly with aerobic, stretch and weights  Other Topics Concern   Not on file  Social History Narrative   Not on file   Social Drivers of Health   Financial Resource Strain: Low Risk  (06/24/2023)   Overall Financial Resource Strain (CARDIA)    Difficulty of Paying Living Expenses: Not hard at all  Food Insecurity: No Food Insecurity (12/17/2022)   Hunger Vital Sign    Worried About Running Out of Food in the Last Year: Never true    Ran Out of Food in the Last Year: Never true  Transportation Needs: No Transportation Needs (06/24/2023)   PRAPARE - Administrator, Civil Service (Medical): No    Lack of Transportation (Non-Medical): No  Physical Activity: Sufficiently Active (06/24/2023)   Exercise Vital Sign    Days of Exercise per Week: 6 days    Minutes of Exercise per Session: 40 min  Stress: No Stress Concern Present (12/17/2022)   [employer] of Occupational Health - Occupational Stress Questionnaire    Feeling of Stress : Not at all  Social Connections: Socially Integrated (06/24/2023)   Social Connection and Isolation Panel [NHANES]    Frequency of Communication with Friends and Family: More than three times a week    Frequency of Social Gatherings with Friends and Family: Twice a week    Attends Religious Services: 1 to 4 times per year    Active Member of Clubs or Organizations: Yes    Attends Engineer, structural: More than 4 times per year    Marital Status: Married  Catering manager Violence: Not on file    Past Surgical History:  Procedure Laterality Date   APPENDECTOMY  1943   CATARACT EXTRACTION, BILATERAL     2019   COLONOSCOPY     CYSTOSCOPY WITH INSERTION OF  UROLIFT     CYSTOSCOPY WITH LITHOLAPAXY N/A 10/10/2021   Procedure: CYSTOSCOPY WITH LITHOLAPAXY;  Surgeon: Marcine Matar, MD;  Location: [facility] [place];  Service: Urology;  Laterality: N/A;   EYE SURGERY     laser surgery on left numerous times.    HOLMIUM LASER APPLICATION N/A 10/10/2021   Procedure: HOLMIUM LASER APPLICATION;  Surgeon: Marcine Matar, MD;  Location: [facility];  Service: Urology;  Laterality: N/A;   POLYPECTOMY     TOTAL HIP ARTHROPLASTY Left 11/15/2019   Procedure: TOTAL HIP ARTHROPLASTY ANTERIOR APPROACH;  Surgeon: Durene Romans, MD;  Location: [facility];  Service: Orthopedics;  Laterality: Left;  70 mins   TRABECULECTOMY Left 09/04/2016    Family History  Problem Relation Age of Onset   Obstructive Sleep Apnea Sister    Heart disease Father        MI  Diabetes Father    Arthritis Brother        knees   Heart disease Brother        a fib   Breast cancer Sister    Colon cancer Paternal Uncle 9   Colon polyps Neg Hx    Esophageal cancer Neg Hx    Rectal cancer Neg Hx    Stomach cancer Neg Hx    Prostate cancer Neg Hx     Allergies  Allergen Reactions   Levaquin [Levofloxacin In D5w] Hives    Current Outpatient Medications on File Prior to Visit  Medication Sig Dispense Refill   alfuzosin (UROXATRAL) 10 MG 24 hr tablet Take 10 mg by mouth daily.     azithromycin (ZITHROMAX) 250 MG tablet Take 2 tablets on day 1, then 1 tablet daily on days 2 through 5 6 tablet 0   brimonidine-timolol (COMBIGAN) 0.2-0.5 % ophthalmic solution Place 1 drop into the right eye 2 (two) times daily.      brinzolamide (AZOPT) 1 % ophthalmic suspension Place 1 drop into the right eye 3 (three) times daily.     cholecalciferol (VITAMIN D3) 25 MCG (1000 UNIT) tablet Take 1,000 Units by mouth daily.     famotidine (PEPCID) 20 MG tablet Take 1 tablet (20 mg total) by mouth daily. 90 tablet 1   glyBURIDE (DIABETA) 5 MG tablet Take 2 tablets (10 mg  total) by mouth 2 (two) times daily with a meal. 360 tablet 0   HYDROcodone bit-homatropine (HYCODAN) 5-1.5 MG/5ML syrup Take 5 mLs by mouth every 6 (six) hours as needed for cough. 120 mL 0   ipratropium (ATROVENT) 0.03 % nasal spray USE 2 SPRAYS IN EACH       NOSTRIL AS NEEDED FOR      RHINITIS 30 mL 0   JANUVIA 100 MG tablet TAKE 1 TABLET DAILY 90 tablet 1   JARDIANCE 10 MG TABS tablet TAKE 2 TABLETS DAILY 180 tablet 1   metFORMIN (GLUCOPHAGE) 1000 MG tablet TAKE 1 TABLET TWICE A DAY 180 tablet 1   Multiple Vitamin (MULTIVITAMIN WITH MINERALS) TABS tablet Take 1 tablet by mouth daily.     simvastatin (ZOCOR) 10 MG tablet TAKE 1 TABLET AT BEDTIME 90 tablet 1   travoprost, benzalkonium, (TRAVATAN) 0.004 % ophthalmic solution Place 1 drop into the right eye at bedtime.     TRUE METRIX BLOOD GLUCOSE TEST test strip USE TO TEST BLOOD SUGAR ONCE DAILY. 100 strip 12   valACYclovir (VALTREX) 1000 MG tablet Take 1 tablet (1,000 mg total) by mouth daily. 90 tablet 3   [employer] LANCETS MISC Use as directed once daily to check blood sugar.  DX E11.9 100 each 6   No current facility-administered medications on file prior to visit.    BP 135/60   Pulse (!) 56   Temp 97.6 F (36.4 C)   Resp 18   Ht 5\' 7"  (1.702 m)   Wt 171 lb (77.6 kg)   SpO2 99%   BMI 26.78 kg/m        Objective:   Physical Exam  General Mental Status- Alert. General Appearance- Not in acute distress.   Skin General: Color- Normal Color. Moisture- Normal Moisture.  Neck Carotid Arteries- Normal color. Moisture- Normal Moisture. No carotid bruits. No JVD.  Chest and Lung Exam Auscultation: Breath Sounds:-Normal.  Cardiovascular Auscultation:Rythm- Regular. Murmurs & Other Heart Sounds:Auscultation of the heart reveals- No Murmurs.  Abdomen Inspection:-Inspeection Normal. Palpation/Percussion:Note:No mass. Palpation and Percussion of the  abdomen reveal- Non Tender, Non Distended + BS, no rebound or  guarding.   Neurologic Cranial Nerve exam:- CN III-XII intact(No nystagmus), symmetric smile. Strength:- 5/5 equal and symmetric strength both upper and lower extremities.       Assessment & Plan:   Assessment and Plan    Osteoporosis Screening Family history of osteoporosis in three sisters and one brother. No personal history of steroid use or unexplained fractures. -Review patient's history to determine if he qualifies for a bone density test. Don't see qualifier at this present time.  Sinusitis Recent course of azithromycin for sinus infection. Still experiencing thick mucus, but overall improvement in symptoms. No preference for Flonase or steroid nasal sprays. -Continue Atrovent nasal spray as needed. -Try plain Mucinex (without Sudafed) for mucus thinning. -Consider adding Augmentin later in the week if symptoms persist.  Type 2 Diabetes Mellitus Blood sugar 99 this morning, but recent A1C in the high sevens. On Januvia 100mg  daily, Jardiance 10mg  twice daily, and Metformin 1000mg  twice daily. -Continue current medication regimen. -Encourage better diet and exercise to aim for A1C of 7.5.  Hyperlipidemia On Simvastatin 10mg  for cholesterol control. Recent cholesterol level was 175. -Continue Simvastatin 10mg .  Vaccinations Received old version of shingles vaccine in 2010. No record of receiving updated Shingrix vaccine. -Recommend receiving updated Shingrix vaccine, a two-shot series, at the pharmacy.  Vitamin D Patient takes a daily multivitamin and additional D3. No history of low vitamin D levels. -Continue current vitamin regimen.   Follow up 6 months or sooner if needed.    Esperanza Richters, PA-C

## 2023-06-29 NOTE — Patient Instructions (Signed)
Osteoporosis Screening Family history of osteoporosis in three sisters and one brother. No personal history of steroid use or unexplained fractures. -Review patient's history to determine if he qualifies for a bone density test. Don't see qualifier at this present time.  Sinusitis Recent course of azithromycin for sinus infection. Still experiencing thick mucus, but overall improvement in symptoms. No preference for Flonase or steroid nasal sprays. -Continue Atrovent nasal spray as needed. -Try plain Mucinex (without Sudafed) for mucus thinning. -Consider adding Augmentin later in the week if symptoms persist.  Type 2 Diabetes Mellitus Blood sugar 99 this morning, but recent A1C in the high sevens. On Januvia 100mg  daily, Jardiance 10mg  twice daily, and Metformin 1000mg  twice daily. -Continue current medication regimen. -Encourage better diet and exercise to aim for A1C of 7.5.  Hyperlipidemia On Simvastatin 10mg  for cholesterol control. Recent cholesterol level was 175. -Continue Simvastatin 10mg .  Vaccinations Received old version of shingles vaccine in 2010. No record of receiving updated Shingrix vaccine. -Recommend receiving updated Shingrix vaccine, a two-shot series, at the pharmacy.  Vitamin D Patient takes a daily multivitamin and additional D3. No history of low vitamin D levels. -Continue current vitamin regimen.   Follow up 6 months or sooner if needed.

## 2023-07-31 ENCOUNTER — Other Ambulatory Visit: Payer: Self-pay | Admitting: Family Medicine

## 2023-08-17 ENCOUNTER — Encounter: Payer: Self-pay | Admitting: Podiatry

## 2023-08-17 ENCOUNTER — Ambulatory Visit (INDEPENDENT_AMBULATORY_CARE_PROVIDER_SITE_OTHER): Payer: Medicare Other | Admitting: Podiatry

## 2023-08-17 ENCOUNTER — Other Ambulatory Visit: Payer: Self-pay | Admitting: Family Medicine

## 2023-08-17 DIAGNOSIS — B351 Tinea unguium: Secondary | ICD-10-CM

## 2023-08-17 DIAGNOSIS — E119 Type 2 diabetes mellitus without complications: Secondary | ICD-10-CM

## 2023-08-17 NOTE — Progress Notes (Signed)
This patient returns to my office for at risk foot care.  This patient requires this care by a professional since this patient will be at risk due to having diabetes.  This patient is unable to cut nails himself since the patient cannot reach his nails.These nails are painful walking and wearing shoes.  This patient presents for at risk foot care today.  General Appearance  Alert, conversant and in no acute stress.  Vascular  Dorsalis pedis and posterior tibial  pulses are weakly  palpable  bilaterally.  Capillary return is within normal limits  bilaterally. Temperature is within normal limits  bilaterally.  Neurologic  Senn-Weinstein monofilament wire test within normal limits  bilaterally. Muscle power within normal limits bilaterally.  Nails Thick disfigured discolored nails with subungual debris  from hallux to fifth toes bilaterally. No evidence of bacterial infection or drainage bilaterally.  Orthopedic  No limitations of motion  feet .  No crepitus or effusions noted.  No bony pathology or digital deformities noted.  Skin  normotropic skin with no porokeratosis noted bilaterally.  No signs of infections or ulcers noted.     Onychomycosis  Pain in right toes  Pain in left toes  Consent was obtained for treatment procedures.   Mechanical debridement of nails 1-5  bilaterally performed with a nail nipper.  Filed with dremel without incident.    Return office visit  10 weeks                    Told patient to return for periodic foot care and evaluation due to potential at risk complications.   Helane Gunther DPM

## 2023-09-10 ENCOUNTER — Other Ambulatory Visit: Payer: Self-pay | Admitting: Family Medicine

## 2023-09-14 ENCOUNTER — Ambulatory Visit: Payer: Self-pay | Admitting: Family Medicine

## 2023-09-14 ENCOUNTER — Ambulatory Visit (HOSPITAL_BASED_OUTPATIENT_CLINIC_OR_DEPARTMENT_OTHER)
Admission: RE | Admit: 2023-09-14 | Discharge: 2023-09-14 | Disposition: A | Discharge status: Home / Self Care | Source: Ambulatory Visit | Attending: Family Medicine | Admitting: Family Medicine

## 2023-09-14 ENCOUNTER — Ambulatory Visit (INDEPENDENT_AMBULATORY_CARE_PROVIDER_SITE_OTHER): Admitting: Family Medicine

## 2023-09-14 ENCOUNTER — Encounter: Payer: Self-pay | Admitting: Family Medicine

## 2023-09-14 VITALS — BP 142/58 | HR 73 | Temp 97.5°F | Ht 67.0 in | Wt 169.0 lb

## 2023-09-14 DIAGNOSIS — R051 Acute cough: Secondary | ICD-10-CM

## 2023-09-14 LAB — POC COVID19 BINAXNOW: SARS Coronavirus 2 Ag: NEGATIVE

## 2023-09-14 MED ORDER — AMOXICILLIN-POT CLAVULANATE 875-125 MG PO TABS: 1.0000 | ORAL_TABLET | Freq: Two times a day (BID) | ORAL | 0 refills | Status: DC

## 2023-09-14 MED ORDER — BENZONATATE 100 MG PO CAPS
100.0000 mg | ORAL_CAPSULE | Freq: Two times a day (BID) | ORAL | 0 refills | Status: DC | PRN
Start: 2023-09-14 — End: 2023-12-14

## 2023-09-14 NOTE — Patient Instructions (Signed)
 [name]

## 2023-09-14 NOTE — Progress Notes (Signed)
 Acute Office Visit  Subjective:     Patient ID: [name], male    DOB: 1935/01/30, 89 y.o.   MRN: [id number]  Chief Complaint  Patient presents with   Cough     Patient is in today for cough.  Discussed the use of AI scribe software for clinical note transcription with the patient, who gave verbal consent to proceed.  History of Present Illness The patient presents with a deep, raspy, congested cough and yellow mucus production.  The symptoms began last week with runny sinuses while traveling and progressed to a cough over the weekend. He attempted to manage the sinus drainage with Mucinex, but it was ineffective. The mucus is described as thick, yellow, and sticky, with occasional yellow discharge when blowing his nose. No blood in the mucus, headache, or sinus pressure. He experiences chills but denies fever, body aches, or upset stomach, although he felt a bit acidic today. No chest pain or trouble breathing, but the cough is easily triggered by movement and feels deep.  He notes a sensation of reduced hearing, possibly due to ear pressure, but denies ear pain. There is a sensation of ear pressure, possibly related to the upper respiratory symptoms.  He mentions that his blood pressure is often slightly elevated during visits, and he has a home blood pressure monitor but has not used it recently.        All review of systems negative except what is listed in the HPI      Objective:    BP (!) 142/58   Pulse 73   Temp (!) 97.5 F (36.4 C) (Oral)   Ht 5\' 7"  (1.702 m)   Wt 169 lb (76.7 kg)   SpO2 97%   BMI 26.47 kg/m    Physical Exam Vitals reviewed.  Constitutional:      General: He is not in acute distress.    Appearance: Normal appearance.  HENT:     Head: Normocephalic.     Right Ear: Tympanic membrane normal.     Left Ear: Tympanic membrane normal.     Nose: Congestion present.     Mouth/Throat:     Mouth: Mucous membranes are moist.      Comments: Postnasal drainage Cardiovascular:     Rate and Rhythm: Normal rate and regular rhythm.  Pulmonary:     Effort: Pulmonary effort is normal. No respiratory distress.     Breath sounds: Rhonchi present.  Musculoskeletal:     Cervical back: Normal range of motion and neck supple.  Skin:    General: Skin is warm and dry.  Neurological:     Mental Status: He is alert and oriented to person, place, and time.  Psychiatric:        Mood and Affect: Mood normal.        Behavior: Behavior normal.        Thought Content: Thought content normal.        Judgment: Judgment normal.     No results found for any visits on 09/14/23.      Assessment & Plan:   Problem List Items Addressed This Visit   None Visit Diagnoses       Acute cough    -  Primary   Relevant Medications   amoxicillin-clavulanate (AUGMENTIN) 875-125 MG tablet   benzonatate (TESSALON) 100 MG capsule   Other Relevant Orders   POC COVID-19 BinaxNow   DG Chest 2 View      COVID negative Adding  Augmentin and getting CXR given lung sounds Adding PRN Tessalon Continue supportive measures including rest, hydration, humidifier use, steam showers, warm compresses to sinuses, warm liquids with lemon and honey, and over-the-counter cough, cold, and analgesics as needed.  Patient aware of signs/symptoms requiring further/urgent evaluation.      Meds ordered this encounter  Medications   amoxicillin-clavulanate (AUGMENTIN) 875-125 MG tablet    Sig: Take 1 tablet by mouth 2 (two) times daily for 7 days.    Dispense:  14 tablet    Refill:  0    Supervising Provider:   Danise Edge A [[id number]]   benzonatate (TESSALON) 100 MG capsule    Sig: Take 1 capsule (100 mg total) by mouth 2 (two) times daily as needed for cough.    Dispense:  20 capsule    Refill:  0    Supervising Provider:   Danise Edge A [[id number]]    Return if symptoms worsen or fail to improve.  Clayborne Dana, NP

## 2023-09-14 NOTE — Telephone Encounter (Signed)
  Chief Complaint: productive cough Symptoms: cough, yellow sputum Frequency: since last week Pertinent Negatives: Patient denies fever, SOB, hemoptysis, CP Disposition: [] ED /[] Urgent Care (no appt availability in office) / [x] Appointment(In office/virtual)/ []  [facility]/ [] Home Care/ [] Refused Recommended Disposition /[] [facility]/ []  Follow-up with PCP Additional Notes: Pt states that he developed cough last week. Pt states that his coughing spells are severe. Pt states that the sputum is yellow. Pt denies fever, SOB, CP. Pt states that he would like a medication to get over this. Pt states that he originally had a sore throat as well but that resolved. Sched today with prov in pcp office.    Copied from CRM [id number]. Topic: Clinical - Red Word Triage >> Sep 14, 2023  8:52 AM Theodis Sato wrote: Red Word that prompted transfer to Nurse Triage: Yellow mucus with chest rattling cough. Reason for Disposition  SEVERE coughing spells (e.g., whooping sound after coughing, vomiting after coughing)  Answer Assessment - Initial Assessment Questions 1. ONSET: "When did the cough begin?"      Late last week  2. SEVERITY: "How bad is the cough today?"      Severe when triggered 3. SPUTUM: "Describe the color of your sputum" (none, dry cough; clear, white, yellow, green)     yellow 4. HEMOPTYSIS: "Are you coughing up any blood?" If so ask: "How much?" (flecks, streaks, tablespoons, etc.)     denies 5. DIFFICULTY BREATHING: "Are you having difficulty breathing?" If Yes, ask: "How bad is it?" (e.g., mild, moderate, severe)    - MILD: No SOB at rest, mild SOB with walking, speaks normally in sentences, can lie down, no retractions, pulse < 100.    - MODERATE: SOB at rest, SOB with minimal exertion and prefers to sit, cannot lie down flat, speaks in phrases, mild retractions, audible wheezing, pulse 100-120.    - SEVERE: Very SOB at rest, speaks in single words, struggling to  breathe, sitting hunched forward, retractions, pulse > 120      None that I've noticed 6. FEVER: "Do you have a fever?" If Yes, ask: "What is your temperature, how was it measured, and when did it start?"     denies 7. CARDIAC HISTORY: "Do you have any history of heart disease?" (e.g., heart attack, congestive heart failure)      denies 8. LUNG HISTORY: "Do you have any history of lung disease?"  (e.g., pulmonary embolus, asthma, emphysema)     denies 9. PE RISK FACTORS: "Do you have a history of blood clots?" (or: recent major surgery, recent prolonged travel, bedridden)     denies 10. OTHER SYMPTOMS: "Do you have any other symptoms?" (e.g., runny nose, wheezing, chest pain)       Runny nose, sore throat has resolved 12. TRAVEL: "Have you traveled out of the country in the last month?" (e.g., travel history, exposures)       denies  Protocols used: Cough - Acute Productive-A-AH

## 2023-09-15 ENCOUNTER — Encounter: Payer: Self-pay | Admitting: Family Medicine

## 2023-09-15 DIAGNOSIS — J988 Other specified respiratory disorders: Secondary | ICD-10-CM

## 2023-09-21 MED ORDER — DOXYCYCLINE HYCLATE 100 MG PO TABS: 100.0000 mg | ORAL_TABLET | Freq: Two times a day (BID) | ORAL | 0 refills | Status: AC

## 2023-10-04 ENCOUNTER — Other Ambulatory Visit: Payer: Self-pay | Admitting: Family Medicine

## 2023-10-05 ENCOUNTER — Other Ambulatory Visit: Payer: Self-pay | Admitting: Family Medicine

## 2023-10-19 ENCOUNTER — Other Ambulatory Visit: Payer: Self-pay | Admitting: Medical

## 2023-10-28 ENCOUNTER — Ambulatory Visit (INDEPENDENT_AMBULATORY_CARE_PROVIDER_SITE_OTHER): Payer: Medicare Other | Admitting: Podiatry

## 2023-10-28 ENCOUNTER — Encounter: Payer: Self-pay | Admitting: Podiatry

## 2023-10-28 ENCOUNTER — Other Ambulatory Visit: Payer: Self-pay | Admitting: Family Medicine

## 2023-10-28 DIAGNOSIS — E119 Type 2 diabetes mellitus without complications: Secondary | ICD-10-CM | POA: Diagnosis not present

## 2023-10-28 DIAGNOSIS — B351 Tinea unguium: Secondary | ICD-10-CM | POA: Diagnosis not present

## 2023-10-28 NOTE — Progress Notes (Signed)
This patient returns to my office for at risk foot care.  This patient requires this care by a professional since this patient will be at risk due to having diabetes.  This patient is unable to cut nails himself since the patient cannot reach his nails.These nails are painful walking and wearing shoes.  This patient presents for at risk foot care today.  General Appearance  Alert, conversant and in no acute stress.  Vascular  Dorsalis pedis and posterior tibial  pulses are weakly  palpable  bilaterally.  Capillary return is within normal limits  bilaterally. Temperature is within normal limits  bilaterally.  Neurologic  Senn-Weinstein monofilament wire test within normal limits  bilaterally. Muscle power within normal limits bilaterally.  Nails Thick disfigured discolored nails with subungual debris  from hallux to fifth toes bilaterally. No evidence of bacterial infection or drainage bilaterally.  Orthopedic  No limitations of motion  feet .  No crepitus or effusions noted.  No bony pathology or digital deformities noted.  Skin  normotropic skin with no porokeratosis noted bilaterally.  No signs of infections or ulcers noted.     Onychomycosis  Pain in right toes  Pain in left toes  Consent was obtained for treatment procedures.   Mechanical debridement of nails 1-5  bilaterally performed with a nail nipper.  Filed with dremel without incident.    Return office visit  10 weeks                    Told patient to return for periodic foot care and evaluation due to potential at risk complications.   Helane Gunther DPM

## 2023-11-16 LAB — HM DIABETES EYE EXAM

## 2023-11-21 ENCOUNTER — Other Ambulatory Visit: Payer: Self-pay | Admitting: Family Medicine

## 2023-11-28 ENCOUNTER — Other Ambulatory Visit: Payer: Self-pay | Admitting: Family Medicine

## 2023-12-02 ENCOUNTER — Other Ambulatory Visit: Payer: Self-pay | Admitting: Family Medicine

## 2023-12-14 ENCOUNTER — Ambulatory Visit (INDEPENDENT_AMBULATORY_CARE_PROVIDER_SITE_OTHER): Admitting: Family Medicine

## 2023-12-14 ENCOUNTER — Encounter: Payer: Self-pay | Admitting: Family Medicine

## 2023-12-14 VITALS — BP 137/53 | HR 63 | Temp 97.6°F | Ht 67.0 in | Wt 163.0 lb

## 2023-12-14 DIAGNOSIS — J0100 Acute maxillary sinusitis, unspecified: Secondary | ICD-10-CM

## 2023-12-14 MED ORDER — DOXYCYCLINE HYCLATE 100 MG PO TABS: 100.0000 mg | ORAL_TABLET | Freq: Two times a day (BID) | ORAL | 0 refills | Status: AC

## 2023-12-14 NOTE — Progress Notes (Signed)
 Acute Office Visit  Subjective:     Patient ID: [name], male    DOB: 07-21-1934, 89 y.o.   MRN: [id number]  Chief Complaint  Patient presents with   Sinus Problem     Patient is in today for sinusitis.   Discussed the use of AI scribe software for clinical note transcription with the patient, who gave verbal consent to proceed.  History of Present Illness [name] is an 89 year old male who presents with persistent nasal drainage following an airline trip.  He has experienced significant nasal congestion/ drainage for the past ten days, which began approximately five days after an airline trip. The nasal drainage started as a runny nose lasting four to five days, which did not respond to Mucinex  or ipratropium bromide . Recently, the mucus has thickened and turned yellow. Minimal facial pressure, no headaches, sore throat, fever, chills, body aches, or cough. He uses a nasal spray that provides temporary relief for a couple of hours, requiring multiple applications throughout the day. No breathing difficulties or chest pain.  He is concerned about his upcoming travel plans, which include a flight and a long drive, and wishes to address his symptoms before departure.        ROS All review of systems negative except what is listed in the HPI      Objective:    BP (!) 137/53   Pulse 63   Temp 97.6 F (36.4 C) (Oral)   Ht 5\' 7"  (1.702 m)   Wt 163 lb (73.9 kg)   SpO2 99%   BMI 25.53 kg/m    Physical Exam Vitals reviewed.  Constitutional:      General: He is not in acute distress.    Appearance: Normal appearance. He is not ill-appearing.  HENT:     Nose: Congestion and rhinorrhea present.     Mouth/Throat:     Pharynx: Postnasal drip present.  Cardiovascular:     Rate and Rhythm: Normal rate and regular rhythm.  Pulmonary:     Effort: Pulmonary effort is normal.     Breath sounds: Normal breath sounds.  Musculoskeletal:     Cervical back:  Normal range of motion.  Lymphadenopathy:     Cervical: No cervical adenopathy.  Skin:    General: Skin is warm and dry.  Neurological:     Mental Status: He is alert and oriented to person, place, and time.  Psychiatric:        Mood and Affect: Mood normal.        Behavior: Behavior normal.        Thought Content: Thought content normal.        Judgment: Judgment normal.         No results found for any visits on 12/14/23.      Assessment & Plan:   Problem List Items Addressed This Visit       Active Problems   Sinusitis - Primary   Relevant Medications   doxycycline  (VIBRA -TABS) 100 MG tablet    Assessment & Plan Acute rhinosinusitis Acute rhinosinusitis post-air travel with nasal drainage and yellow mucus. No fever or other symptoms.  - Prescribed doxycycline  for one week. - Recommended saline nasal spray for frequent irrigation. - Advised vitamin C and zinc supplements to boost immunity especially before upcoming travel     Meds ordered this encounter  Medications   doxycycline  (VIBRA -TABS) 100 MG tablet    Sig: Take 1 tablet (100 mg total) by  mouth 2 (two) times daily for 7 days.    Dispense:  14 tablet    Refill:  0    Supervising Provider:   Randie Bustle A [[id number]]    Return if symptoms worsen or fail to improve.  Everlina Hock, NP

## 2023-12-16 ENCOUNTER — Other Ambulatory Visit: Payer: Self-pay | Admitting: Family Medicine

## 2023-12-21 ENCOUNTER — Other Ambulatory Visit: Payer: Medicare Other

## 2023-12-28 ENCOUNTER — Ambulatory Visit: Payer: Medicare Other | Admitting: Family Medicine

## 2024-01-13 ENCOUNTER — Encounter: Payer: Self-pay | Admitting: Podiatry

## 2024-01-13 ENCOUNTER — Ambulatory Visit (INDEPENDENT_AMBULATORY_CARE_PROVIDER_SITE_OTHER): Admitting: Podiatry

## 2024-01-13 DIAGNOSIS — B351 Tinea unguium: Secondary | ICD-10-CM

## 2024-01-13 DIAGNOSIS — E119 Type 2 diabetes mellitus without complications: Secondary | ICD-10-CM

## 2024-01-13 NOTE — Progress Notes (Signed)
This patient returns to my office for at risk foot care.  This patient requires this care by a professional since this patient will be at risk due to having diabetes.  This patient is unable to cut nails himself since the patient cannot reach his nails.These nails are painful walking and wearing shoes.  This patient presents for at risk foot care today.  General Appearance  Alert, conversant and in no acute stress.  Vascular  Dorsalis pedis and posterior tibial  pulses are weakly  palpable  bilaterally.  Capillary return is within normal limits  bilaterally. Temperature is within normal limits  bilaterally.  Neurologic  Senn-Weinstein monofilament wire test within normal limits  bilaterally. Muscle power within normal limits bilaterally.  Nails Thick disfigured discolored nails with subungual debris  from hallux to fifth toes bilaterally. No evidence of bacterial infection or drainage bilaterally.  Orthopedic  No limitations of motion  feet .  No crepitus or effusions noted.  No bony pathology or digital deformities noted.  Skin  normotropic skin with no porokeratosis noted bilaterally.  No signs of infections or ulcers noted.     Onychomycosis  Pain in right toes  Pain in left toes  Consent was obtained for treatment procedures.   Mechanical debridement of nails 1-5  bilaterally performed with a nail nipper.  Filed with dremel without incident.    Return office visit  10 weeks                    Told patient to return for periodic foot care and evaluation due to potential at risk complications.   Helane Gunther DPM

## 2024-01-14 ENCOUNTER — Other Ambulatory Visit: Payer: Self-pay | Admitting: Family Medicine

## 2024-01-18 ENCOUNTER — Other Ambulatory Visit (INDEPENDENT_AMBULATORY_CARE_PROVIDER_SITE_OTHER)

## 2024-01-18 DIAGNOSIS — E782 Mixed hyperlipidemia: Secondary | ICD-10-CM | POA: Diagnosis not present

## 2024-01-18 DIAGNOSIS — E119 Type 2 diabetes mellitus without complications: Secondary | ICD-10-CM

## 2024-01-18 LAB — COMPREHENSIVE METABOLIC PANEL WITH GFR
ALT: 9 U/L (ref 0–53)
AST: 10 U/L (ref 0–37)
Albumin: 4.1 g/dL (ref 3.5–5.2)
Alkaline Phosphatase: 55 U/L (ref 39–117)
BUN: 15 mg/dL (ref 6–23)
CO2: 27 meq/L (ref 19–32)
Calcium: 9 mg/dL (ref 8.4–10.5)
Chloride: 106 meq/L (ref 96–112)
Creatinine, Ser: 1.41 mg/dL (ref 0.40–1.50)
GFR: 44.97 mL/min — ABNORMAL LOW (ref >60.00)
Glucose, Bld: 173 mg/dL — ABNORMAL HIGH (ref 70–99)
Potassium: 4.5 meq/L (ref 3.5–5.1)
Sodium: 139 meq/L (ref 135–145)
Total Bilirubin: 0.5 mg/dL (ref 0.2–1.2)
Total Protein: 6 g/dL (ref 6.0–8.3)

## 2024-01-18 LAB — LIPID PANEL
Cholesterol: 175 mg/dL (ref 0–200)
HDL: 70.9 mg/dL (ref >39.00)
LDL Cholesterol: 87 mg/dL (ref 0–99)
NonHDL: 103.7
Total CHOL/HDL Ratio: 2
Triglycerides: 85 mg/dL (ref 0.0–149.0)
VLDL: 17 mg/dL (ref 0.0–40.0)

## 2024-01-18 LAB — HEMOGLOBIN A1C: Hgb A1c MFr Bld: 7.6 % — ABNORMAL HIGH (ref 4.6–6.5)

## 2024-01-19 ENCOUNTER — Ambulatory Visit: Payer: Self-pay | Admitting: Medical

## 2024-01-22 ENCOUNTER — Ambulatory Visit: Admitting: Medical

## 2024-01-22 VITALS — BP 124/68 | HR 58 | Temp 98.0°F | Resp 16 | Ht 67.0 in | Wt 166.0 lb

## 2024-01-22 DIAGNOSIS — Z7984 Long term (current) use of oral hypoglycemic drugs: Secondary | ICD-10-CM | POA: Diagnosis not present

## 2024-01-22 DIAGNOSIS — R944 Abnormal results of kidney function studies: Secondary | ICD-10-CM

## 2024-01-22 DIAGNOSIS — E119 Type 2 diabetes mellitus without complications: Secondary | ICD-10-CM

## 2024-01-22 DIAGNOSIS — Z23 Encounter for immunization: Secondary | ICD-10-CM | POA: Diagnosis not present

## 2024-01-22 DIAGNOSIS — E785 Hyperlipidemia, unspecified: Secondary | ICD-10-CM

## 2024-01-22 LAB — MICROALBUMIN / CREATININE URINE RATIO
Creatinine,U: 58.1 mg/dL
Microalb Creat Ratio: UNDETERMINED mg/g (ref 0.0–30.0)
Microalb, Ur: <0.7 mg/dL

## 2024-01-22 NOTE — Patient Instructions (Signed)
 Type 2 Diabetes Mellitus HbA1c at 7.6 indicates suboptimal control. Recent medication lapse may have affected results. Adjustments needed for improved glycemic control. - Increase Jardiance  to 25 mg daily. - Decrease metformin  to 500 mg three times a day. - Continue glipizide at current dose. - Check HbA1c in three months. - Monitor daily blood glucose levels. - Consider Actos if kidney function declines or further adjustments needed.  Chronic Kidney Disease (CKD) Creatinine normal but low GFR concerning. Hydration crucial to prevent worsening. - Advise hydration, especially before lab tests. - Monitor kidney function regularly. - Consider stopping metformin  and starting Actos if kidney function declines.  Hyperlipidemia Cholesterol levels normal. - Continue current cholesterol medication regimen.  General Health Maintenance Shingrix vaccine not administered, recommended for shingles prevention. - Administer first dose of Shingrix vaccine today. - Schedule second dose in 2 to 6 months.  Follow up date to be determined after future cmp and A1c on Apr 23, 2024. Please get scheduled for that

## 2024-01-22 NOTE — Progress Notes (Signed)
 Subjective:    Patient ID: [name], male    DOB: 09/18/1934, 89 y.o.   MRN: [id number]  HPI  [name] is an 89 year old male with diabetes and chronic kidney disease who presents for follow-up of his lab results.  Recent lab work indicates a glomerular filtration rate (GFR) in the 40 range. His creatinine level is 1.41, which is within the normal range.  He monitors his blood sugar daily. His hemoglobin A1c is 7.6, indicating suboptimal glycemic control. On the day of his lab work, his blood sugar was 173, but it was 145 at home that morning. He notes that his blood sugar levels were higher in March for about ten days, which might have skewed his A1c results. He is currently taking metformin , Jardiance , and glipizide for diabetes management. He was not taking his medications for about ten days while on the road, which may have affected his blood sugar control.  He has had the Shingrix vaccine in the past, but it was the older version from 2010.      Recommended shingrix vaccine.  Review of Systems  Constitutional:  Negative for chills, fatigue and fever.  HENT:  Negative for congestion, ear discharge and ear pain.   Respiratory:  Negative for cough, chest tightness, shortness of breath and wheezing.   Cardiovascular:  Negative for chest pain and palpitations.  Gastrointestinal:  Negative for abdominal pain, blood in stool and rectal pain.  Genitourinary:  Negative for dysuria, frequency and hematuria.  Musculoskeletal:  Negative for back pain, myalgias and neck stiffness.  Skin:  Negative for rash.  Neurological:  Negative for facial asymmetry, speech difficulty, weakness and light-headedness.  Hematological:  Negative for adenopathy. Does not bruise/bleed easily.  Psychiatric/Behavioral:  Negative for behavioral problems, dysphoric mood and suicidal ideas. The patient is not nervous/anxious.     Past Medical History:  Diagnosis Date   Allergy    mild    Arthritis    fingers, hip   BPH (benign prostatic hypertrophy)    Cataract    removed both eyes   Complication of anesthesia    gases nasally causes sneezing    Diabetes mellitus 2000   type 2   Diabetes mellitus type 2 in nonobese (HCC) 06/19/2009   Qualifier: Diagnosis of  By: Georgian ROSALEA CHARM Lamar     GERD (gastroesophageal reflux disease)    Glaucoma    follow[facility] by Dr Ruthellen at [facility]   History of kidney stones    Hyperlipidemia    Left hip pain 03/14/2012   Malignant neoplasm of prostate (HCC) 03/15/2019   Medicare annual wellness visit, subsequent 12/31/2015   Onychomycosis 09/17/2015   Prostate cancer (HCC)    Tubular adenoma of colon 2003   Vasomotor rhinitis 05/02/2013     Social History   Socioeconomic History   Marital status: Married    Spouse name: Mary   Number of children: 0   Years of education: Not on file   Highest education level: Doctorate  Occupational History   Occupation: semi retired    Associate Professor: Aeronautical engineer: RETIRED  Tobacco Use   Smoking status: Never   Smokeless tobacco: Never  Vaping Use   Vaping status: Never Used  Substance and Sexual Activity   Alcohol use: Not Currently   Drug use: No   Sexual activity: Yes    Comment: lives with wife, travels frequently, no major dietary restrictions. exercises regularly with aerobic, stretch and weights  Other Topics Concern   Not on file  Social History Narrative   Not on file   Social Drivers of Health   Financial Resource Strain: Low Risk  (01/17/2024)   Overall Financial Resource Strain (CARDIA)    Difficulty of Paying Living Expenses: Not hard at all  Food Insecurity: No Food Insecurity (01/17/2024)   Hunger Vital Sign    Worried About Running Out of Food in the Last Year: Never true    Ran Out of Food in the Last Year: Never true  Transportation Needs: Unknown (01/17/2024)   PRAPARE - Administrator, Civil Service (Medical): No    Lack of Transportation  (Non-Medical): Not on file  Physical Activity: Sufficiently Active (01/17/2024)   Exercise Vital Sign    Days of Exercise per Week: 5 days    Minutes of Exercise per Session: 40 min  Stress: No Stress Concern Present (01/17/2024)   [employer] of Occupational Health - Occupational Stress Questionnaire    Feeling of Stress: Not at all  Social Connections: Socially Integrated (01/17/2024)   Social Connection and Isolation Panel    Frequency of Communication with Friends and Family: More than three times a week    Frequency of Social Gatherings with Friends and Family: Twice a week    Attends Religious Services: 1 to 4 times per year    Active Member of [employer] or Organizations: Yes    Attends Banker Meetings: Patient declined    Marital Status: Married  Catering manager Violence: Not on file    Past Surgical History:  Procedure Laterality Date   APPENDECTOMY  1943   CATARACT EXTRACTION, BILATERAL     2019   COLONOSCOPY     CYSTOSCOPY WITH INSERTION OF UROLIFT     CYSTOSCOPY WITH LITHOLAPAXY N/A 10/10/2021   Procedure: CYSTOSCOPY WITH LITHOLAPAXY;  Surgeon: Matilda Senior, MD;  Location: [facility];  Service: Urology;  Laterality: N/A;   EYE SURGERY     laser surgery on left numerous times.    HOLMIUM LASER APPLICATION N/A 10/10/2021   Procedure: HOLMIUM LASER APPLICATION;  Surgeon: Matilda Senior, MD;  Location: [facility];  Service: Urology;  Laterality: N/A;   POLYPECTOMY     TOTAL HIP ARTHROPLASTY Left 11/15/2019   Procedure: TOTAL HIP ARTHROPLASTY ANTERIOR APPROACH;  Surgeon: Ernie Cough, MD;  Location: [facility];  Service: Orthopedics;  Laterality: Left;  70 mins   TRABECULECTOMY Left 09/04/2016    Family History  Problem Relation Age of Onset   Obstructive Sleep Apnea Sister    Heart disease Father        MI   Diabetes Father    Arthritis Brother        knees   Heart disease Brother        a fib   Breast cancer  Sister    Colon cancer Paternal Uncle 57   Colon polyps Neg Hx    Esophageal cancer Neg Hx    Rectal cancer Neg Hx    Stomach cancer Neg Hx    Prostate cancer Neg Hx     Allergies  Allergen Reactions   Levaquin [Levofloxacin In D5w] Hives    Current Outpatient Medications on File Prior to Visit  Medication Sig Dispense Refill   alfuzosin (UROXATRAL) 10 MG 24 hr tablet Take 10 mg by mouth daily.     brimonidine -timolol  (COMBIGAN ) 0.2-0.5 % ophthalmic solution Place 1 drop into the right eye 2 (two) times daily.  brinzolamide  (AZOPT ) 1 % ophthalmic suspension Place 1 drop into the right eye 3 (three) times daily.     cholecalciferol (VITAMIN D3) 25 MCG (1000 UNIT) tablet Take 1,000 Units by mouth daily.     famotidine  (PEPCID ) 20 MG tablet Take 1 tablet (20 mg total) by mouth daily. 90 tablet 1   glyBURIDE  (DIABETA ) 5 MG tablet TAKE 2 TABLETS TWO TIMES A DAY WITH MEALS 360 tablet 0   ipratropium (ATROVENT ) 0.03 % nasal spray Place 2 sprays into both nostrils daily as needed for rhinitis. 90 mL 0   JARDIANCE  10 MG TABS tablet TAKE 2 TABLETS DAILY 180 tablet 1   metFORMIN  (GLUCOPHAGE ) 1000 MG tablet Take 1 tablet (1,000 mg total) by mouth 2 (two) times daily. 180 tablet 1   Multiple Vitamin (MULTIVITAMIN WITH MINERALS) TABS tablet Take 1 tablet by mouth daily.     simvastatin  (ZOCOR ) 10 MG tablet TAKE 1 TABLET AT BEDTIME 90 tablet 1   sitaGLIPtin  (JANUVIA ) 100 MG tablet Take 1 tablet (100 mg total) by mouth daily. 90 tablet 1   travoprost , benzalkonium, (TRAVATAN ) 0.004 % ophthalmic solution Place 1 drop into the right eye at bedtime.     TRUE METRIX BLOOD GLUCOSE TEST test strip USE TO TEST BLOOD SUGAR ONCE DAILY. 100 strip 12   valACYclovir  (VALTREX ) 1000 MG tablet TAKE 1 TABLET EVERY DAY 90 tablet 3   [employer] LANCETS MISC Use as directed once daily to check blood sugar.  DX E11.9 100 each 6   No current facility-administered medications on file prior to visit.    BP 124/68  (BP Location: Left Arm, Patient Position: Sitting)   Pulse (!) 58   Temp 98 F (36.7 C) (Oral)   Resp 16   Ht 5' 7 (1.702 m)   Wt 166 lb (75.3 kg)   SpO2 98%   BMI 26.00 kg/m        Objective:   Physical Exam  General Mental Status- Alert. General Appearance- Not in acute distress.   Skin General: Color- Normal Color. Moisture- Normal Moisture.  Neck  No JVD.  Chest and Lung Exam Auscultation: Breath Sounds:-CTA  Cardiovascular Auscultation:Rythm- RRR Murmurs & Other Heart Sounds:Auscultation of the heart reveals- No Murmurs.  Abdomen Inspection:-Inspeection Normal. Palpation/Percussion:Note:No mass. Palpation and Percussion of the abdomen reveal- Non Tender, Non Distended + BS, no rebound or guarding.   Neurologic Cranial Nerve exam:- CN III-XII intact(No nystagmus), symmetric smile. Strength:- 5/5 equal and symmetric strength both upper and lower extremities.   Lowe ext- no pedal edema, negative homans signs. Calfs symmetric.    Assessment & Plan:   Patient Instructions  Type 2 Diabetes Mellitus HbA1c at 7.6 indicates suboptimal control. Recent medication lapse may have affected results. Adjustments needed for improved glycemic control. - Increase Jardiance  to 25 mg daily. - Decrease metformin  to 500 mg three times a day. - Continue glipizide at current dose. - Check HbA1c in three months. - Monitor daily blood glucose levels. - Consider Actos if kidney function declines or further adjustments needed.  Chronic Kidney Disease (CKD) Creatinine normal but low GFR concerning. Hydration crucial to prevent worsening. - Advise hydration, especially before lab tests. - Monitor kidney function regularly. - Consider stopping metformin  and starting Actos if kidney function declines.  Hyperlipidemia Cholesterol levels normal. - Continue current cholesterol medication regimen.  General Health Maintenance Shingrix vaccine not administered, recommended for  shingles prevention. - Administer first dose of Shingrix vaccine today. - Schedule second dose in 2 to 6  months.  Follow up date to be determined after future cmp and A1c on Apr 23, 2024. Please get scheduled for that   [employer], PA-C

## 2024-01-23 ENCOUNTER — Ambulatory Visit: Payer: Self-pay | Admitting: Medical

## 2024-01-25 ENCOUNTER — Other Ambulatory Visit: Payer: Self-pay | Admitting: Family Medicine

## 2024-01-25 ENCOUNTER — Ambulatory Visit: Admitting: Medical

## 2024-03-01 ENCOUNTER — Other Ambulatory Visit: Payer: Self-pay | Admitting: Family Medicine

## 2024-03-23 ENCOUNTER — Encounter: Payer: Self-pay | Admitting: Podiatry

## 2024-03-23 ENCOUNTER — Ambulatory Visit (INDEPENDENT_AMBULATORY_CARE_PROVIDER_SITE_OTHER): Admitting: Podiatry

## 2024-03-23 DIAGNOSIS — E119 Type 2 diabetes mellitus without complications: Secondary | ICD-10-CM | POA: Diagnosis not present

## 2024-03-23 DIAGNOSIS — B351 Tinea unguium: Secondary | ICD-10-CM | POA: Diagnosis not present

## 2024-03-23 NOTE — Progress Notes (Signed)
This patient returns to my office for at risk foot care.  This patient requires this care by a professional since this patient will be at risk due to having diabetes.  This patient is unable to cut nails himself since the patient cannot reach his nails.These nails are painful walking and wearing shoes.  This patient presents for at risk foot care today.  General Appearance  Alert, conversant and in no acute stress.  Vascular  Dorsalis pedis and posterior tibial  pulses are weakly  palpable  bilaterally.  Capillary return is within normal limits  bilaterally. Temperature is within normal limits  bilaterally.  Neurologic  Senn-Weinstein monofilament wire test within normal limits  bilaterally. Muscle power within normal limits bilaterally.  Nails Thick disfigured discolored nails with subungual debris  from hallux to fifth toes bilaterally. No evidence of bacterial infection or drainage bilaterally.  Orthopedic  No limitations of motion  feet .  No crepitus or effusions noted.  No bony pathology or digital deformities noted.  Skin  normotropic skin with no porokeratosis noted bilaterally.  No signs of infections or ulcers noted.     Onychomycosis  Pain in right toes  Pain in left toes  Consent was obtained for treatment procedures.   Mechanical debridement of nails 1-5  bilaterally performed with a nail nipper.  Filed with dremel without incident.    Return office visit  10 weeks                    Told patient to return for periodic foot care and evaluation due to potential at risk complications.   Helane Gunther DPM

## 2024-04-06 ENCOUNTER — Other Ambulatory Visit: Payer: Self-pay | Admitting: Family Medicine

## 2024-04-12 ENCOUNTER — Other Ambulatory Visit: Payer: Self-pay | Admitting: Family Medicine

## 2024-04-26 ENCOUNTER — Other Ambulatory Visit

## 2024-04-26 DIAGNOSIS — R944 Abnormal results of kidney function studies: Secondary | ICD-10-CM | POA: Diagnosis not present

## 2024-04-26 DIAGNOSIS — E119 Type 2 diabetes mellitus without complications: Secondary | ICD-10-CM | POA: Diagnosis not present

## 2024-04-26 LAB — COMPREHENSIVE METABOLIC PANEL WITH GFR
ALT: 16 U/L (ref 0–53)
AST: 14 U/L (ref 0–37)
Albumin: 4.2 g/dL (ref 3.5–5.2)
Alkaline Phosphatase: 56 U/L (ref 39–117)
BUN: 16 mg/dL (ref 6–23)
CO2: 26 meq/L (ref 19–32)
Calcium: 8.7 mg/dL (ref 8.4–10.5)
Chloride: 103 meq/L (ref 96–112)
Creatinine, Ser: 1.36 mg/dL (ref 0.40–1.50)
GFR: 46.87 mL/min — ABNORMAL LOW (ref >60.00)
Glucose, Bld: 181 mg/dL — ABNORMAL HIGH (ref 70–99)
Potassium: 4.2 meq/L (ref 3.5–5.1)
Sodium: 137 meq/L (ref 135–145)
Total Bilirubin: 0.4 mg/dL (ref 0.2–1.2)
Total Protein: 6.2 g/dL (ref 6.0–8.3)

## 2024-04-26 LAB — HEMOGLOBIN A1C: Hgb A1c MFr Bld: 7.7 % — ABNORMAL HIGH (ref 4.6–6.5)

## 2024-04-27 ENCOUNTER — Ambulatory Visit (INDEPENDENT_AMBULATORY_CARE_PROVIDER_SITE_OTHER): Admitting: Medical

## 2024-04-27 VITALS — BP 124/70 | HR 56 | Temp 97.7°F | Resp 16 | Ht 67.0 in | Wt 170.0 lb

## 2024-04-27 DIAGNOSIS — E1165 Type 2 diabetes mellitus with hyperglycemia: Secondary | ICD-10-CM | POA: Diagnosis not present

## 2024-04-27 DIAGNOSIS — Z23 Encounter for immunization: Secondary | ICD-10-CM

## 2024-04-27 DIAGNOSIS — E785 Hyperlipidemia, unspecified: Secondary | ICD-10-CM

## 2024-04-27 DIAGNOSIS — Z7984 Long term (current) use of oral hypoglycemic drugs: Secondary | ICD-10-CM

## 2024-04-27 DIAGNOSIS — N183 Chronic kidney disease, stage 3 unspecified: Secondary | ICD-10-CM

## 2024-04-27 DIAGNOSIS — R944 Abnormal results of kidney function studies: Secondary | ICD-10-CM | POA: Diagnosis not present

## 2024-04-27 MED ORDER — EMPAGLIFLOZIN 25 MG PO TABS: 25.0000 mg | ORAL_TABLET | Freq: Every day | ORAL | 3 refills | Status: AC

## 2024-04-27 NOTE — Progress Notes (Signed)
   Subjective:    Patient ID: [name], male    DOB: 08-02-1934, 89 y.o.   MRN: [id number]  HPI  [name] is an 89 year old male with type 2 diabetes mellitus who presents for follow-up of his blood sugar control and kidney function.  His recent hemoglobin A1c was 7.7%, slightly increased from 7.6% three months ago. He has been on metformin  for a long time, currently taking 500 mg in the morning and 1000 mg at night due to difficulty remembering the noon dose. He also takes Jardiance , cutting 10 mg tablets to achieve a 25 mg dose, and glyburide  10 mg in the morning and evening. Morning blood sugar readings are typically between 140 and 150 mg/dL, with a recent reading of 138 mg/dL. Using Equal as a sweetener in the morning can lead to low blood sugar, so he uses a tablespoon of sugar instead.  His kidney function is monitored with a GFR of 46, which has been stable for a long time. He attempted to hydrate before his last blood test, drinking several bottles of water .  He has a family history of diabetes, with all seven of his father's brothers having the condition. 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 He is currently using fluorometholone eye drops for an eye infection and to manage eye pressure, which was previously treated with prednisolone.  He is active, playing golf and engaging in treadmill, recumbent bike, and weight exercises. He recently drove 1400 miles to [place] , indicating his high level of activity.    Review of Systems      Objective:   Physical Exam  General Mental Status- Alert. General Appearance- Not in acute distress.   Skin General: Color- Normal Color. Moisture- Normal Moisture.  Neck  No JVD.  Chest and Lung Exam Auscultation: Breath Sounds:-CTA  Cardiovascular Auscultation:Rythm- RRR Murmurs & Other Heart Sounds:Auscultation of the heart reveals- No  Murmurs.  Abdomen Inspection:-Inspeection Normal. Palpation/Percussion:Note:No mass. Palpation and Percussion of the abdomen reveal- Non Tender, Non Distended + BS, no rebound or guarding.   Neurologic Cranial Nerve exam:- CN III-XII intact(No nystagmus), symmetric smile. Strength:- 5/5 equal and symmetric strength both upper and lower extremities.       Assessment & Plan:   Patient Instructions  Type 2 diabetes mellitus Type 2 diabetes mellitus with HbA1c at 7.7%, stable glucose levels, occasional hypoglycemia with artificial sweeteners. Current regimen includes metformin , Jardiance , glyburide , and Januvia .  - Continue metformin  500 mg in the morning and 1000 mg at night. - Prescribe Jardiance  25 mg tablets, 90 tablets with 3 refills. - Continue glyburide . - Continue Januvia  100 mg daily. - Advise to reduce sugar in cereal as we discussed. - Consider using Stevia as an alternative sweetener.  Chronic kidney disease stage 3  Creatinine and urine microalbumin levels normal, no diabetic nephropathy.  Hyperlipidemia Hyperlipidemia managed with simvastatin  10 mg. Cholesterol levels improved from over 300 with treatment. Dosage reduced from 40 mg to 10 mg due to well-managed levels. - Continue simvastatin  10 mg daily.  General Health Maintenance Engages in regular physical activity including golf, treadmill, recumbent bike, and weight exercises.  Follow-Up Future labs week of Aug 02, 2023 -Follow up appointment first week feb.   Minervia Osso, PA-C  I personally spent a total of 42 minutes in the care of the patient today including performing a medically appropriate exam/evaluation, counseling and educating, and documenting clinical information in the EHR.

## 2024-04-27 NOTE — Patient Instructions (Addendum)
 Type 2 diabetes mellitus Type 2 diabetes mellitus with HbA1c at 7.7%, stable glucose levels, occasional hypoglycemia with artificial sweeteners. Current regimen includes metformin , Jardiance , glyburide , and Januvia .  - Continue metformin  500 mg in the morning and 1000 mg at night. - Prescribe Jardiance  25 mg tablets, 90 tablets with 3 refills. - Continue glyburide . - Continue Januvia  100 mg daily. - Advise to reduce sugar in cereal as we discussed. - Consider using Stevia as an alternative sweetener.  Chronic kidney disease stage 3  Creatinine and urine microalbumin levels normal, no diabetic nephropathy.  Hyperlipidemia Hyperlipidemia managed with simvastatin  10 mg. Cholesterol levels improved from over 300 with treatment. Dosage reduced from 40 mg to 10 mg due to well-managed levels. - Continue simvastatin  10 mg daily.  General Health Maintenance Engages in regular physical activity including golf, treadmill, recumbent bike, and weight exercises.  Follow-Up Future labs week of Aug 02, 2023 -Follow up appointment first week feb.

## 2024-05-03 ENCOUNTER — Other Ambulatory Visit: Payer: Self-pay | Admitting: Family Medicine

## 2024-06-01 ENCOUNTER — Encounter: Payer: Self-pay | Admitting: Podiatry

## 2024-06-01 ENCOUNTER — Ambulatory Visit (INDEPENDENT_AMBULATORY_CARE_PROVIDER_SITE_OTHER): Admitting: Podiatry

## 2024-06-01 DIAGNOSIS — E119 Type 2 diabetes mellitus without complications: Secondary | ICD-10-CM | POA: Diagnosis not present

## 2024-06-01 DIAGNOSIS — B351 Tinea unguium: Secondary | ICD-10-CM

## 2024-06-01 NOTE — Progress Notes (Signed)
 This patient returns to my office for at risk foot care.  This patient requires this care by a professional since this patient will be at risk due to having diabetes.  This patient is unable to cut nails himself since the patient cannot reach his nails.These nails are painful walking and wearing shoes.  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  General Appearance  Alert, conversant and in no acute stress.  Vascular  Dorsalis pedis and posterior tibial  pulses are weakly  palpable  bilaterally.  Capillary return is within normal limits  bilaterally. Temperature is within normal limits  bilaterally.  Neurologic  Senn-Weinstein monofilament wire test within normal limits  bilaterally. Muscle power within normal limits bilaterally.  Nails Thick disfigured discolored nails with subungual debris  from hallux to fifth toes bilaterally. No evidence of bacterial infection or drainage bilaterally.  Orthopedic  No limitations of motion  feet .  No crepitus or effusions noted.  No bony pathology or digital deformities noted.  Skin  normotropic skin with no porokeratosis noted bilaterally. Healing skin lesion in left arch per melanoma removal.  Dime sized open wound noted left arch with no infection or drainage.  Told patient to wear bandage when wearing shoes.  Onychomycosis  Pain in right toes  Pain in left toes  Skin/lesion/ulcer.  Consent was obtained for treatment procedures.   Mechanical debridement of nails 1-5  bilaterally performed with a nail nipper.  Filed with dremel without incident. Lesion bandaged with DSD.     Return office visit  10 weeks                    Told patient to return for periodic foot care and evaluation due to potential at risk complications.   Cordella Bold DPM

## 2024-07-14 ENCOUNTER — Encounter: Payer: Self-pay | Admitting: Gastroenterology

## 2024-08-01 ENCOUNTER — Other Ambulatory Visit

## 2024-08-03 ENCOUNTER — Ambulatory Visit: Payer: Self-pay | Admitting: Medical

## 2024-08-03 ENCOUNTER — Other Ambulatory Visit

## 2024-08-03 DIAGNOSIS — R944 Abnormal results of kidney function studies: Secondary | ICD-10-CM | POA: Diagnosis not present

## 2024-08-03 DIAGNOSIS — E785 Hyperlipidemia, unspecified: Secondary | ICD-10-CM

## 2024-08-03 DIAGNOSIS — E1165 Type 2 diabetes mellitus with hyperglycemia: Secondary | ICD-10-CM | POA: Diagnosis not present

## 2024-08-03 LAB — COMPREHENSIVE METABOLIC PANEL WITH GFR
ALT: 12 U/L (ref 3–53)
AST: 15 U/L (ref 5–37)
Albumin: 4.3 g/dL (ref 3.5–5.2)
Alkaline Phosphatase: 72 U/L (ref 39–117)
BUN: 17 mg/dL (ref 6–23)
CO2: 28 meq/L (ref 19–32)
Calcium: 9.4 mg/dL (ref 8.4–10.5)
Chloride: 106 meq/L (ref 96–112)
Creatinine, Ser: 1.42 mg/dL (ref 0.40–1.50)
GFR: 44.42 mL/min — ABNORMAL LOW
Glucose, Bld: 220 mg/dL — ABNORMAL HIGH (ref 70–99)
Potassium: 4.7 meq/L (ref 3.5–5.1)
Sodium: 140 meq/L (ref 135–145)
Total Bilirubin: 0.5 mg/dL (ref 0.2–1.2)
Total Protein: 6.6 g/dL (ref 6.0–8.3)

## 2024-08-03 LAB — LIPID PANEL
Cholesterol: 179 mg/dL (ref 28–200)
HDL: 64.4 mg/dL
LDL Cholesterol: 90 mg/dL (ref 10–99)
NonHDL: 114.73
Total CHOL/HDL Ratio: 3
Triglycerides: 122 mg/dL (ref 10.0–149.0)
VLDL: 24.4 mg/dL (ref 0.0–40.0)

## 2024-08-03 LAB — HEMOGLOBIN A1C: Hgb A1c MFr Bld: 8 % — ABNORMAL HIGH (ref 4.6–6.5)

## 2024-08-05 ENCOUNTER — Telehealth: Payer: Self-pay

## 2024-08-05 MED ORDER — GLYBURIDE 5 MG PO TABS: ORAL_TABLET | ORAL | 1 refills | Status: AC

## 2024-08-05 NOTE — Telephone Encounter (Signed)
 Copied from CRM #[id number]. Topic: Clinical - Medication Refill >> Aug 05, 2024 10:26 AM [name] wrote: Medication: glyBURIDE  (DIABETA ) 5 MG tablet  Has the patient contacted their pharmacy? Yes (Agent: If no, request that the patient contact the pharmacy for the refill. If patient does not wish to contact the pharmacy document the reason why and proceed with request.) (Agent: If yes, when and what did the pharmacy advise?)  This is the patient's preferred pharmacy:  [facility] - [place], [place] - [address] [facility] [address] [place] [address] Phone: [phone] Fax: [phone]   Is this the correct pharmacy for this prescription? Yes If no, delete pharmacy and type the correct one.   Has the prescription been filled recently? No  Is the patient out of the medication? Yes  Has the patient been seen for an appointment in the last year OR does the patient have an upcoming appointment? Yes  Can we respond through MyChart? Yes  Agent: Please be advised that Rx refills may take up to 3 business days. We ask that you follow-up with your pharmacy.

## 2024-08-09 ENCOUNTER — Ambulatory Visit: Admitting: Gastroenterology

## 2024-08-09 ENCOUNTER — Encounter: Payer: Self-pay | Admitting: Gastroenterology

## 2024-08-09 VITALS — BP 114/60 | HR 83 | Ht 72.0 in | Wt 171.1 lb

## 2024-08-09 DIAGNOSIS — K5900 Constipation, unspecified: Secondary | ICD-10-CM

## 2024-08-09 DIAGNOSIS — K5909 Other constipation: Secondary | ICD-10-CM | POA: Diagnosis not present

## 2024-08-09 DIAGNOSIS — Z860100 Personal history of colon polyps, unspecified: Secondary | ICD-10-CM

## 2024-08-09 MED ORDER — NA SULFATE-K SULFATE-MG SULF 17.5-3.13-1.6 GM/177ML PO SOLN: 1.0000 | Freq: Once | ORAL | 0 refills | Status: AC

## 2024-08-09 NOTE — Patient Instructions (Signed)
 You have been scheduled for a colonoscopy. Please follow written instructions given to you at your visit today.   If you use inhalers (even only as needed), please bring them with you on the day of your procedure.  DO NOT TAKE 7 DAYS PRIOR TO TEST- Trulicity (dulaglutide) Ozempic, Wegovy (semaglutide) Mounjaro, Zepbound (tirzepatide) Bydureon Bcise (exanatide extended release)  DO NOT TAKE 1 DAY PRIOR TO YOUR TEST Rybelsus (semaglutide) Adlyxin (lixisenatide) Victoza (liraglutide) Byetta (exanatide) _______________________________________________________________________  If your blood pressure at your visit was 140/90 or greater, please contact your primary care physician to follow up on this.  _______________________________________________________  If you are age 48 or older, your body mass index should be between 23-30. Your Body mass index is 23.21 kg/m. If this is out of the aforementioned range listed, please consider follow up with your Primary Care Provider.  If you are age 53 or younger, your body mass index should be between 19-25. Your Body mass index is 23.21 kg/m. If this is out of the aformentioned range listed, please consider follow up with your Primary Care Provider.   ________________________________________________________  The [facility] providers would like to encourage you to use MYCHART to communicate with providers for non-urgent requests or questions.  Due to long hold times on the telephone, sending your provider a message by [facility] may be a faster and more efficient way to get a response.  Please allow 48 business hours for a response.  Please remember that this is for non-urgent requests.  _______________________________________________________  [facility] is using a team-based approach to care.  Your team is made up of your doctor and two to three APPS. Our APPS (Nurse Practitioners and Physician Assistants) work with your physician to  ensure care continuity for you. They are fully qualified to address your health concerns and develop a treatment plan. They communicate directly with your gastroenterologist to care for you. Seeing the Advanced Practice Practitioners on your physician's team can help you by facilitating care more promptly, often allowing for earlier appointments, access to diagnostic testing, procedures, and other specialty referrals.

## 2024-08-10 ENCOUNTER — Encounter: Admitting: Medical

## 2024-08-10 VITALS — BP 122/70 | HR 58 | Temp 97.7°F | Resp 15 | Ht 72.0 in | Wt 170.6 lb

## 2024-08-10 DIAGNOSIS — E1165 Type 2 diabetes mellitus with hyperglycemia: Secondary | ICD-10-CM

## 2024-08-10 DIAGNOSIS — Z7984 Long term (current) use of oral hypoglycemic drugs: Secondary | ICD-10-CM

## 2024-08-10 DIAGNOSIS — N1831 Chronic kidney disease, stage 3a: Secondary | ICD-10-CM

## 2024-08-10 DIAGNOSIS — E785 Hyperlipidemia, unspecified: Secondary | ICD-10-CM

## 2024-08-10 DIAGNOSIS — R944 Abnormal results of kidney function studies: Secondary | ICD-10-CM

## 2024-08-10 LAB — COMPREHENSIVE METABOLIC PANEL WITH GFR
ALT: 12 U/L (ref 3–53)
AST: 14 U/L (ref 5–37)
Albumin: 4.3 g/dL (ref 3.5–5.2)
Alkaline Phosphatase: 63 U/L (ref 39–117)
BUN: 20 mg/dL (ref 6–23)
CO2: 27 meq/L (ref 19–32)
Calcium: 9.6 mg/dL (ref 8.4–10.5)
Chloride: 106 meq/L (ref 96–112)
Creatinine, Ser: 1.22 mg/dL (ref 0.40–1.50)
GFR: 53.29 mL/min — ABNORMAL LOW
Glucose, Bld: 93 mg/dL (ref 70–99)
Potassium: 4.5 meq/L (ref 3.5–5.1)
Sodium: 142 meq/L (ref 135–145)
Total Bilirubin: 0.4 mg/dL (ref 0.2–1.2)
Total Protein: 6.4 g/dL (ref 6.0–8.3)

## 2024-08-10 NOTE — Progress Notes (Signed)
 "  Subjective:    Patient ID: [name], male    DOB: Nov 19, 1934, 89 y.o.   MRN: [id number]  HPI  [name] is an 89 year old male with type 2 diabetes mellitus who presents for management of elevated blood glucose levels.  His recent HbA1c increased to 8.0%. Fasting values 134 to 165 mg/dL over the past week after reducing sweets later in the day. Pt shows me progressively improving sugar levels with much stricter diet. Rare occasional fasting sugar level near 70.   He sometimes develops low blood sugar symptoms in the morning if he does not add sugar to his breakfast cereal after taking his medications. He takes metformin  500 mg in the morning and 1000 mg at dinner, glyburide  10 mg, Jardiance  25 mg, and Januvia  100 mg. There have been no recent episodes of severe hypoglycemia.  He eats an egg on an English muffin plus Cheerios or oatmeal for breakfast and a salad with a main meal for dinner. He is increasing afternoon protein with cheese, cottage cheese, and pecans to help with glucose control.  He walks on a treadmill three days a week for about a mile at 2.6 mph. He checks his blood sugar every morning and adjusts his diet based on results.  He has high cholesterol controlled on atorvastatin 10 mg. He also has reduced kidney function with a GFR of 44, while creatinine has remained within normal range, which is relevant to his current diabetes regimen.    Review of Systems  Constitutional:  Negative for chills, fatigue and fever.  Respiratory:  Negative for chest tightness, shortness of breath and wheezing.   Cardiovascular:  Negative for chest pain and palpitations.  Gastrointestinal:  Negative for abdominal pain, blood in stool, nausea and vomiting.  Genitourinary:  Negative for dysuria.  Musculoskeletal:  Negative for back pain and myalgias.  Skin:  Negative for rash.  Neurological:  Negative for dizziness, syncope, weakness and light-headedness.  Hematological:   Negative for adenopathy.  Psychiatric/Behavioral:  Negative for behavioral problems, dysphoric mood and suicidal ideas. The patient is not nervous/anxious.     Past Medical History:  Diagnosis Date   Allergy    mild   Arthritis    fingers, hip   BPH (benign prostatic hypertrophy)    Cataract    removed both eyes   Complication of anesthesia    gases nasally causes sneezing    Diabetes mellitus 2000   type 2   Diabetes mellitus type 2 in nonobese (HCC) 06/19/2009   Qualifier: Diagnosis of  By: Georgian ROSALEA CHARM Lamar     GERD (gastroesophageal reflux disease)    Glaucoma    follow[facility] Dr Ruthellen [facility]   History of kidney stones    Hyperlipidemia    Left hip pain 03/14/2012   Malignant neoplasm of prostate (HCC) 03/15/2019   Medicare annual wellness visit, subsequent 12/31/2015   Onychomycosis 09/17/2015   Prostate cancer (HCC)    Tubular adenoma of colon 2003   Vasomotor rhinitis 05/02/2013     Social History   Socioeconomic History   Marital status: Married    Spouse name: Mary   Number of children: 0   Years of education: Not on file   Highest education level: Doctorate  Occupational History   Occupation: semi retired    Associate Professor: [employer]    Employer: RETIRED  Tobacco Use   Smoking status: Never   Smokeless tobacco: Never  Vaping Use   Vaping status:  Never Used  Substance and Sexual Activity   Alcohol use: Not Currently   Drug use: No   Sexual activity: Yes    Comment: lives with wife, travels frequently, no major dietary restrictions. exercises regularly with aerobic, stretch and weights  Other Topics Concern   Not on file  Social History Narrative   Not on file   Social Drivers of Health   Tobacco Use: Low Risk (06/01/2024)   Patient History    Smoking Tobacco Use: Never    Smokeless Tobacco Use: Never    Passive Exposure: Not on file  Financial Resource Strain: Low Risk (04/25/2024)   Overall Financial Resource Strain (CARDIA)     Difficulty of Paying Living Expenses: Not hard at all  Food Insecurity: No Food Insecurity (04/25/2024)   Epic    Worried About Programme Researcher, Broadcasting/film/video in the Last Year: Never true    Ran Out of Food in the Last Year: Never true  Transportation Needs: No Transportation Needs (04/25/2024)   Epic    Lack of Transportation (Medical): No    Lack of Transportation (Non-Medical): No  Physical Activity: Unknown (04/25/2024)   Exercise Vital Sign    Days of Exercise per Week: 5 days    Minutes of Exercise per Session: Not on file  Stress: No Stress Concern Present (04/25/2024)   [employer] of Occupational Health - Occupational Stress Questionnaire    Feeling of Stress: Not at all  Social Connections: Socially Integrated (04/25/2024)   Social Connection and Isolation Panel    Frequency of Communication with Friends and Family: More than three times a week    Frequency of Social Gatherings with Friends and Family: Twice a week    Attends Religious Services: 1 to 4 times per year    Active Member of [employer] or Organizations: Yes    Attends Engineer, Structural: More than 4 times per year    Marital Status: Married  Catering Manager Violence: Not on file  Depression (PHQ2-9): Low Risk (08/10/2024)   Depression (PHQ2-9)    PHQ-2 Score: 0  Alcohol Screen: Low Risk (04/25/2024)   Alcohol Screen    Last Alcohol Screening Score (AUDIT): 1  Housing: Low Risk (04/25/2024)   Epic    Unable to Pay for Housing in the Last Year: No    Number of Times Moved in the Last Year: 0    Homeless in the Last Year: No  Utilities: Not on file  Health Literacy: Not on file    Past Surgical History:  Procedure Laterality Date   APPENDECTOMY  1943   CATARACT EXTRACTION, BILATERAL     2019   COLONOSCOPY     CYSTOSCOPY WITH INSERTION OF UROLIFT     CYSTOSCOPY WITH LITHOLAPAXY N/A 10/10/2021   Procedure: CYSTOSCOPY WITH LITHOLAPAXY;  Surgeon: Matilda Senior, MD;  Location: [facility];  Service: Urology;  Laterality: N/A;   EYE SURGERY     laser surgery on left numerous times.    HOLMIUM LASER APPLICATION N/A 10/10/2021   Procedure: HOLMIUM LASER APPLICATION;  Surgeon: Matilda Senior, MD;  Location: [facility];  Service: Urology;  Laterality: N/A;   JOINT REPLACEMENT     POLYPECTOMY     TOTAL HIP ARTHROPLASTY Left 11/15/2019   Procedure: TOTAL HIP ARTHROPLASTY ANTERIOR APPROACH;  Surgeon: Ernie Cough, MD;  Location: [facility];  Service: Orthopedics;  Laterality: Left;  70 mins   TRABECULECTOMY Left 09/04/2016    Family History  Problem  Relation Age of Onset   Obstructive Sleep Apnea Sister    Heart disease Father        MI   Diabetes Father    Arthritis Brother        knees   Heart disease Brother        a fib   Breast cancer Sister    Colon cancer Paternal Uncle 21   Colon polyps Neg Hx    Esophageal cancer Neg Hx    Rectal cancer Neg Hx    Stomach cancer Neg Hx    Prostate cancer Neg Hx     Allergies[1]  Medications Ordered Prior to Encounter[2]  BP 122/70   Pulse (!) 58   Temp 97.7 F (36.5 C) (Oral)   Resp 15   Ht 6' (1.829 m)   Wt 170 lb 9.6 oz (77.4 kg)   SpO2 96%   BMI 23.14 kg/m        Objective:   Physical Exam  General Mental Status- Alert. General Appearance- Not in acute distress.   Skin General: Color- Normal Color. Moisture- Normal Moisture.  Neck  No JVD.  Chest and Lung Exam Auscultation: Breath Sounds:-Normal.  Cardiovascular Auscultation:Rythm- Regular. Murmurs & Other Heart Sounds:Auscultation of the heart reveals- No Murmurs.  Abdomen Inspection:-Inspeection Normal. Palpation/Percussion:Note:No mass. Palpation and Percussion of the abdomen reveal- Non Tender, Non Distended + BS, no rebound or guarding.   Neurologic Cranial Nerve exam:- CN III-XII intact(No nystagmus), symmetric smile. Strength:- 5/5 equal and symmetric strength both upper and lower extremities.        Assessment & Plan:   Patient Instructions  Type 2 diabetes mellitus with hyperglycemia A1c at 8.0 little on high side but in some cases acceptable for pt in higher age.. Fasting glucose improved with diet, averaging 147 mg/dL. Glyburide  may cause hypoglycemia. Discussed acarbose for glycemic control without hypoglycemia risk. GLP-1 agonists not pursued due to muscle mass concerns. Emphasized muscle mass maintenance and hypoglycemia avoidance in older adults. Discussed insulin  risks and acarbose benefits. - Continue metformin , glyburide , Jardiance , and Januvia . - Maintain dietary modifications: no sweets after 3 PM. - Monitor blood glucose daily, fasting and postprandial. - Pan to review acarbose with pharmacist for interactions and side effects. - Consider discontinuing glyburide  if acarbose is initiated. - Update provider in two weeks with glucose readings and hypoglycemia symptoms. -with 4 meds and if have to further adjust or decrease metformin  then consider referral to endocrinologist.   Chronic kidney disease, stage 3a GFR at 44 indicates stage 3a. Creatinine normal. Kidney function  decreased. Discussed metformin 's impact on kidney function. - Ordered repeat blood test for GFR and kidney function better hydrated than recent check. - Ensure adequate hydration -Recheck as want to see gfr better than 45 with metformin  use   Hyperlipidemia Lipid levels controlled with atorvastatin 10 mg. - Continue atorvastatin 10 mg daily.  General health maintenance Scheduled colonoscopy due to history of precancerous polyps. Discussed procedure risks and benefits, including anesthesia and cancer monitoring. - Pt has decided to proceed with scheduled colonoscopy on February 19th.  Follow up date to be determined after update by my chart on sugar levels in 2 weeks.    I personally spent a total of 41 minutes in the care of the patient today including performing a medically appropriate  exam/evaluation, counseling and educating, placing orders, and documenting clinical information in the EHR.     [1]  Allergies Allergen Reactions   Levaquin [Levofloxacin In D5w] Hives  [2]  Current Outpatient Medications on File Prior to Visit  Medication Sig Dispense Refill   alfuzosin (UROXATRAL) 10 MG 24 hr tablet Take 10 mg by mouth daily.     brimonidine -timolol  (COMBIGAN ) 0.2-0.5 % ophthalmic solution Place 1 drop into the right eye 2 (two) times daily.      brinzolamide  (AZOPT ) 1 % ophthalmic suspension Place 1 drop into the right eye 3 (three) times daily.     cholecalciferol (VITAMIN D3) 25 MCG (1000 UNIT) tablet Take 1,000 Units by mouth daily.     empagliflozin  (JARDIANCE ) 25 MG TABS tablet Take 1 tablet (25 mg total) by mouth daily. 90 tablet 3   famotidine  (PEPCID ) 20 MG tablet Take 1 tablet (20 mg total) by mouth daily. 90 tablet 1   glyBURIDE  (DIABETA ) 5 MG tablet TAKE 2 TABLETS TWO TIMES A DAY WITH MEALS 360 tablet 1   ipratropium (ATROVENT ) 0.03 % nasal spray Place 2 sprays into both nostrils daily as needed for rhinitis. 180 mL 0   metFORMIN  (GLUCOPHAGE ) 1000 MG tablet Take 1 tablet (1,000 mg total) by mouth 2 (two) times daily. 180 tablet 1   Multiple Vitamin (MULTIVITAMIN WITH MINERALS) TABS tablet Take 1 tablet by mouth daily.     Na Sulfate-K Sulfate-Mg Sulfate concentrate (SUPREP) 17.5-3.13-1.6 GM/177ML SOLN Take 1 kit (354 mLs total) by mouth once for 1 dose. 354 mL 0   simvastatin  (ZOCOR ) 10 MG tablet Take 1 tablet (10 mg total) by mouth at bedtime. 90 tablet 1   sitaGLIPtin  (JANUVIA ) 100 MG tablet Take 1 tablet (100 mg total) by mouth daily. 90 tablet 1   travoprost , benzalkonium, (TRAVATAN ) 0.004 % ophthalmic solution Place 1 drop into the right eye at bedtime.     TRUE METRIX BLOOD GLUCOSE TEST test strip USE TO TEST BLOOD SUGAR ONCE DAILY. 100 strip 12   valACYclovir  (VALTREX ) 1000 MG tablet TAKE 1 TABLET EVERY DAY 90 tablet 3   [place] LANCETS MISC Use as  directed once daily to check blood sugar.  DX E11.9 100 each 6   fluorometholone (FML) 0.1 % ophthalmic suspension Place 1 drop into the right eye 4 (four) times daily. (Patient not taking: Reported on 08/10/2024)     No current facility-administered medications on file prior to visit.   "

## 2024-08-10 NOTE — Patient Instructions (Addendum)
 Type 2 diabetes mellitus with hyperglycemia A1c at 8.0 little on high side but in some cases acceptable for pt in higher age.. Fasting glucose improved with diet, averaging 147 mg/dL. Glyburide  may cause hypoglycemia. Discussed acarbose for glycemic control without hypoglycemia risk. GLP-1 agonists not pursued due to muscle mass concerns. Emphasized muscle mass maintenance and hypoglycemia avoidance in older adults. Discussed insulin  risks and acarbose benefits. - Continue metformin , glyburide , Jardiance , and Januvia . - Maintain dietary modifications: no sweets after 3 PM. - Monitor blood glucose daily, fasting and postprandial. - Pan to review acarbose with pharmacist for interactions and side effects. - Consider discontinuing glyburide  if acarbose is initiated. - Update provider in two weeks with glucose readings and hypoglycemia symptoms. -with 4 meds and if have to further adjust or decrease metformin  then consider referral to endocrinologist.   Chronic kidney disease, stage 3a GFR at 44 indicates stage 3a. Creatinine normal. Kidney function  decreased. Discussed metformin 's impact on kidney function. - Ordered repeat blood test for GFR and kidney function better hydrated than recent check. - Ensure adequate hydration -Recheck as want to see gfr better than 45 with metformin  use   Hyperlipidemia Lipid levels controlled with atorvastatin 10 mg. - Continue atorvastatin 10 mg daily.  General health maintenance Scheduled colonoscopy due to history of precancerous polyps. Discussed procedure risks and benefits, including anesthesia and cancer monitoring. - Pt has decided to proceed with scheduled colonoscopy on February 19th.  Follow up date to be determined after update by my chart on sugar levels in 2 weeks.

## 2024-08-11 ENCOUNTER — Ambulatory Visit: Payer: Self-pay | Admitting: Medical

## 2024-08-11 NOTE — Addendum Note (Signed)
 Addended by: DORINA DALLAS DORINA PA-C M on: 08/11/2024 05:08 PM   Modules accepted: Orders

## 2024-08-25 ENCOUNTER — Encounter: Admitting: Gastroenterology

## 2024-09-01 ENCOUNTER — Ambulatory Visit: Admitting: Podiatry
